# Patient Record
Sex: Female | Born: 1950
Health system: Southern US, Community
[De-identification: ages and names within clinical notes are randomized; demographics above are authoritative.]

## PROBLEM LIST (undated history)

## (undated) DIAGNOSIS — D509 Iron deficiency anemia, unspecified: Secondary | ICD-10-CM

## (undated) DIAGNOSIS — M1990 Unspecified osteoarthritis, unspecified site: Secondary | ICD-10-CM

## (undated) DIAGNOSIS — T7840XA Allergy, unspecified, initial encounter: Secondary | ICD-10-CM

## (undated) DIAGNOSIS — I10 Essential (primary) hypertension: Secondary | ICD-10-CM

## (undated) DIAGNOSIS — K219 Gastro-esophageal reflux disease without esophagitis: Secondary | ICD-10-CM

## (undated) DIAGNOSIS — Z87442 Personal history of urinary calculi: Secondary | ICD-10-CM

## (undated) DIAGNOSIS — R519 Headache, unspecified: Secondary | ICD-10-CM

## (undated) DIAGNOSIS — B019 Varicella without complication: Secondary | ICD-10-CM

## (undated) DIAGNOSIS — E785 Hyperlipidemia, unspecified: Secondary | ICD-10-CM

## (undated) DIAGNOSIS — N200 Calculus of kidney: Secondary | ICD-10-CM

## (undated) DIAGNOSIS — R51 Headache: Secondary | ICD-10-CM

## (undated) DIAGNOSIS — M810 Age-related osteoporosis without current pathological fracture: Secondary | ICD-10-CM

## (undated) HISTORY — DX: Allergy, unspecified, initial encounter: T78.40XA

## (undated) HISTORY — DX: Personal history of urinary calculi: Z87.442

## (undated) HISTORY — DX: Age-related osteoporosis without current pathological fracture: M81.0

## (undated) HISTORY — DX: Calculus of kidney: N20.0

## (undated) HISTORY — DX: Gastro-esophageal reflux disease without esophagitis: K21.9

## (undated) HISTORY — DX: Headache: R51

## (undated) HISTORY — PX: COLONOSCOPY: SHX174

## (undated) HISTORY — DX: Essential (primary) hypertension: I10

## (undated) HISTORY — DX: Headache, unspecified: R51.9

## (undated) HISTORY — DX: Unspecified osteoarthritis, unspecified site: M19.90

## (undated) HISTORY — DX: Hyperlipidemia, unspecified: E78.5

## (undated) HISTORY — PX: UPPER GASTROINTESTINAL ENDOSCOPY: SHX188

## (undated) HISTORY — PX: EYE SURGERY: SHX253

## (undated) HISTORY — DX: Varicella without complication: B01.9

## (undated) HISTORY — PX: TONSILLECTOMY: SUR1361

## (undated) HISTORY — DX: Iron deficiency anemia, unspecified: D50.9

---

## 1998-07-19 HISTORY — PX: CERVICAL FUSION: SHX112

## 1999-05-22 ENCOUNTER — Encounter: Admission: RE | Admit: 1999-05-22 | Discharge: 1999-05-22 | Payer: Self-pay | Admitting: Obstetrics and Gynecology

## 1999-05-22 ENCOUNTER — Encounter: Payer: Self-pay | Admitting: Obstetrics and Gynecology

## 1999-08-01 ENCOUNTER — Encounter: Payer: Self-pay | Admitting: Emergency Medicine

## 1999-08-01 ENCOUNTER — Inpatient Hospital Stay (HOSPITAL_COMMUNITY): Admission: EM | Admit: 1999-08-01 | Discharge: 1999-08-02 | Payer: Self-pay | Admitting: Emergency Medicine

## 1999-10-27 ENCOUNTER — Other Ambulatory Visit: Admission: RE | Admit: 1999-10-27 | Discharge: 1999-10-27 | Payer: Self-pay | Admitting: Obstetrics and Gynecology

## 2003-12-31 ENCOUNTER — Ambulatory Visit (HOSPITAL_COMMUNITY): Admission: RE | Admit: 2003-12-31 | Discharge: 2003-12-31 | Payer: Self-pay | Admitting: Gastroenterology

## 2005-06-06 ENCOUNTER — Emergency Department (HOSPITAL_COMMUNITY): Admission: EM | Admit: 2005-06-06 | Discharge: 2005-06-06 | Payer: Self-pay | Admitting: Emergency Medicine

## 2005-06-17 ENCOUNTER — Ambulatory Visit (HOSPITAL_COMMUNITY): Admission: RE | Admit: 2005-06-17 | Discharge: 2005-06-17 | Payer: Self-pay | Admitting: Urology

## 2005-07-01 ENCOUNTER — Ambulatory Visit (HOSPITAL_COMMUNITY): Admission: RE | Admit: 2005-07-01 | Discharge: 2005-07-01 | Payer: Self-pay | Admitting: Urology

## 2005-11-05 ENCOUNTER — Ambulatory Visit (HOSPITAL_COMMUNITY): Admission: RE | Admit: 2005-11-05 | Discharge: 2005-11-06 | Payer: Self-pay | Admitting: Urology

## 2006-06-03 ENCOUNTER — Emergency Department (HOSPITAL_COMMUNITY): Admission: EM | Admit: 2006-06-03 | Discharge: 2006-06-03 | Payer: Self-pay | Admitting: Emergency Medicine

## 2007-04-19 ENCOUNTER — Encounter: Admission: RE | Admit: 2007-04-19 | Discharge: 2007-04-19 | Payer: Self-pay | Admitting: Internal Medicine

## 2007-04-27 ENCOUNTER — Ambulatory Visit (HOSPITAL_COMMUNITY): Admission: RE | Admit: 2007-04-27 | Discharge: 2007-04-27 | Payer: Self-pay | Admitting: Urology

## 2009-05-15 ENCOUNTER — Emergency Department (HOSPITAL_COMMUNITY): Admission: EM | Admit: 2009-05-15 | Discharge: 2009-05-15 | Payer: Self-pay | Admitting: Emergency Medicine

## 2009-06-11 ENCOUNTER — Emergency Department (HOSPITAL_COMMUNITY): Admission: EM | Admit: 2009-06-11 | Discharge: 2009-06-11 | Payer: Self-pay | Admitting: Family Medicine

## 2009-07-17 ENCOUNTER — Emergency Department (HOSPITAL_COMMUNITY): Admission: EM | Admit: 2009-07-17 | Discharge: 2009-07-17 | Payer: Self-pay | Admitting: Family Medicine

## 2009-12-18 ENCOUNTER — Ambulatory Visit (HOSPITAL_COMMUNITY): Admission: RE | Admit: 2009-12-18 | Discharge: 2009-12-18 | Payer: Self-pay | Admitting: Neurological Surgery

## 2010-01-05 ENCOUNTER — Encounter: Admission: RE | Admit: 2010-01-05 | Discharge: 2010-01-05 | Payer: Self-pay | Admitting: Neurological Surgery

## 2010-01-13 ENCOUNTER — Encounter: Admission: RE | Admit: 2010-01-13 | Discharge: 2010-01-13 | Payer: Self-pay | Admitting: Anesthesiology

## 2010-03-27 ENCOUNTER — Encounter: Admission: RE | Admit: 2010-03-27 | Discharge: 2010-03-27 | Payer: Self-pay | Admitting: Internal Medicine

## 2010-04-09 ENCOUNTER — Encounter: Admission: RE | Admit: 2010-04-09 | Discharge: 2010-04-09 | Payer: Self-pay | Admitting: Internal Medicine

## 2010-10-05 LAB — BASIC METABOLIC PANEL
BUN: 18 mg/dL (ref 6–23)
CO2: 30 mEq/L (ref 19–32)
Calcium: 9.1 mg/dL (ref 8.4–10.5)
GFR calc Af Amer: 59 mL/min — ABNORMAL LOW (ref >60)
GFR calc non Af Amer: 49 mL/min — ABNORMAL LOW (ref >60)
Potassium: 3.4 mEq/L — ABNORMAL LOW (ref 3.5–5.1)

## 2010-10-05 LAB — CBC
Hemoglobin: 13.4 g/dL (ref 12.0–15.0)
MCHC: 33.3 g/dL (ref 30.0–36.0)
RDW: 14.8 % (ref 11.5–15.5)
WBC: 7.9 10*3/uL (ref 4.0–10.5)

## 2010-10-05 LAB — DIFFERENTIAL
Lymphs Abs: 2.3 10*3/uL (ref 0.7–4.0)
Neutro Abs: 4.7 10*3/uL (ref 1.7–7.7)
Neutrophils Relative %: 60 % (ref 43–77)

## 2010-10-05 LAB — SURGICAL PCR SCREEN: MRSA, PCR: NEGATIVE

## 2010-10-05 LAB — PROTIME-INR: INR: 0.9 (ref 0.00–1.49)

## 2010-10-19 LAB — POCT URINALYSIS DIP (DEVICE)
Hgb urine dipstick: NEGATIVE
Protein, ur: 30 mg/dL — AB
pH: 6 (ref 5.0–8.0)

## 2010-10-22 LAB — POCT I-STAT, CHEM 8
BUN: 21 mg/dL (ref 6–23)
Glucose, Bld: 99 mg/dL (ref 70–99)
HCT: 46 % (ref 36.0–46.0)
Hemoglobin: 15.6 g/dL — ABNORMAL HIGH (ref 12.0–15.0)
Potassium: 7.9 mEq/L (ref 3.5–5.1)
Sodium: 134 mEq/L — ABNORMAL LOW (ref 135–145)
TCO2: 29 mmol/L (ref 0–100)

## 2010-10-22 LAB — POCT CARDIAC MARKERS: Myoglobin, poc: 90.9 ng/mL (ref 12–200)

## 2015-11-10 ENCOUNTER — Ambulatory Visit (INDEPENDENT_AMBULATORY_CARE_PROVIDER_SITE_OTHER): Payer: Self-pay | Admitting: Internal Medicine

## 2015-11-10 ENCOUNTER — Other Ambulatory Visit: Payer: Self-pay | Admitting: Internal Medicine

## 2015-11-10 ENCOUNTER — Encounter: Payer: Self-pay | Admitting: Internal Medicine

## 2015-11-10 VITALS — BP 164/88 | HR 72 | Temp 97.8°F | Ht 62.0 in | Wt 112.0 lb

## 2015-11-10 DIAGNOSIS — I10 Essential (primary) hypertension: Secondary | ICD-10-CM | POA: Insufficient documentation

## 2015-11-10 DIAGNOSIS — R51 Headache: Secondary | ICD-10-CM

## 2015-11-10 DIAGNOSIS — K219 Gastro-esophageal reflux disease without esophagitis: Secondary | ICD-10-CM

## 2015-11-10 DIAGNOSIS — R519 Headache, unspecified: Secondary | ICD-10-CM

## 2015-11-10 DIAGNOSIS — J302 Other seasonal allergic rhinitis: Secondary | ICD-10-CM

## 2015-11-10 LAB — COMPREHENSIVE METABOLIC PANEL
ALBUMIN: 4.6 g/dL (ref 3.5–5.2)
ALK PHOS: 99 U/L (ref 39–117)
ALT: 16 U/L (ref 0–35)
AST: 25 U/L (ref 0–37)
BUN: 18 mg/dL (ref 6–23)
CALCIUM: 10.1 mg/dL (ref 8.4–10.5)
CHLORIDE: 105 meq/L (ref 96–112)
CO2: 28 mEq/L (ref 19–32)
CREATININE: 1.03 mg/dL (ref 0.40–1.20)
GFR: 57.41 mL/min — ABNORMAL LOW (ref >60.00)
Glucose, Bld: 101 mg/dL — ABNORMAL HIGH (ref 70–99)
POTASSIUM: 4.9 meq/L (ref 3.5–5.1)
SODIUM: 140 meq/L (ref 135–145)
TOTAL PROTEIN: 7.7 g/dL (ref 6.0–8.3)
Total Bilirubin: 0.3 mg/dL (ref 0.2–1.2)

## 2015-11-10 LAB — CBC
HEMATOCRIT: 43.4 % (ref 36.0–46.0)
Hemoglobin: 14.4 g/dL (ref 12.0–15.0)
MCHC: 33.2 g/dL (ref 30.0–36.0)
MCV: 96.3 fl (ref 78.0–100.0)
PLATELETS: 277 10*3/uL (ref 150.0–400.0)
RBC: 4.5 Mil/uL (ref 3.87–5.11)
RDW: 14.1 % (ref 11.5–15.5)
WBC: 6 10*3/uL (ref 4.0–10.5)

## 2015-11-10 LAB — LIPID PANEL
CHOLESTEROL: 262 mg/dL — AB (ref 0–200)
HDL: 59 mg/dL (ref >39.00)
LDL Cholesterol: 184 mg/dL — ABNORMAL HIGH (ref 0–99)
NonHDL: 202.74
TRIGLYCERIDES: 94 mg/dL (ref 0.0–149.0)
Total CHOL/HDL Ratio: 4
VLDL: 18.8 mg/dL (ref 0.0–40.0)

## 2015-11-10 MED ORDER — LISINOPRIL 10 MG PO TABS: 10.0000 mg | ORAL_TABLET | Freq: Every day | ORAL | Status: DC

## 2015-11-10 NOTE — Progress Notes (Signed)
Pre visit review using our clinic review tool, if applicable. No additional management support is needed unless otherwise documented below in the visit note. 

## 2015-11-10 NOTE — Assessment & Plan Note (Signed)
Continue Nexium daily If persist, consider checking for H Pylori

## 2015-11-10 NOTE — Progress Notes (Signed)
HPI  Pt presents to the clinic today to establish care and for management of the conditions listed below. She has not had a PCP in many years.  Frequent Headaches: Mixture of migraine and tension headaches. Occuring weekly. Start behind her left eye and radiates into her temples. She describes the pain as "vice like". She denies dizziness or blurred vision. She does have associated nausea, vomiting, sensitivity to light and sound. She takes Excedrin with minimal relief. She was on Imipramine, Fioricet and Imitrex in the past.  Seasonal Allergies: Worse in the Spring. She takes Nasacort as needed when her allergies flare up.  GERD: She is not sure what her triggers are. She has been taking Nexium OTC with good relief.  Elevated Blood Pressure: She reports she went to the eye doctor 2 weeks ago. Her BP at that time was 170/100. Her BP today is 164/88. She has never been told that she has had high blood pressure. She denies dizziness, blurred vision, chest pain or chest tightness. She has never been treated for this before.  Fly: 2015 Tetanus: 2011 Mammogram: 2011 Pap Smear: 2011 Colon Screening: 2004 Vision Screening: 10/2015 Dentist: as needed  Past Medical History  Diagnosis Date  . Chicken pox   . Frequent headaches   . Allergy   . History of kidney stones     Current Outpatient Prescriptions  Medication Sig Dispense Refill  . Aspirin-Acetaminophen-Caffeine (EXCEDRIN PO) Take 1-2 tablets by mouth as needed.     Marland Kitchen esomeprazole (NEXIUM) 20 MG capsule Take 20 mg by mouth daily at 12 noon.    . triamcinolone (NASACORT ALLERGY 24HR) 55 MCG/ACT AERO nasal inhaler Place 2 sprays into the nose daily as needed.     No current facility-administered medications for this visit.    No Known Allergies  Family History  Problem Relation Age of Onset  . Hyperlipidemia Mother   . Hypertension Mother   . Prostate cancer Father   . Hyperlipidemia Father   . Heart disease Father   .  Hypertension Sister   . Heart disease Brother   . Hypertension Maternal Grandmother     Social History   Social History  . Marital Status: Widowed    Spouse Name: N/A  . Number of Children: N/A  . Years of Education: N/A   Occupational History  . Not on file.   Social History Main Topics  . Smoking status: Never Smoker   . Smokeless tobacco: Never Used  . Alcohol Use: No  . Drug Use: No  . Sexual Activity: No   Other Topics Concern  . Not on file   Social History Narrative  . No narrative on file    ROS:  Constitutional: Pt reports headache. Denies fever, malaise, fatigue, or abrupt weight changes.  HEENT: Denies eye pain, eye redness, ear pain, ringing in the ears, wax buildup, runny nose, nasal congestion, bloody nose, or sore throat. Respiratory: Denies difficulty breathing, shortness of breath, cough or sputum production.   Cardiovascular: Denies chest pain, chest tightness, palpitations or swelling in the hands or feet.  Gastrointestinal: Denies abdominal pain, bloating, constipation, diarrhea or blood in the stool.  Neurological: Denies dizziness, difficulty with memory, difficulty with speech or problems with balance and coordination.  Psych: Denies anxiety, depression, SI/HI.  No other specific complaints in a complete review of systems (except as listed in HPI above).  PE:  BP 164/88 mmHg  Pulse 72  Temp(Src) 97.8 F (36.6 C) (Oral)  Ht _0  (  1.575 m)  Wt 112 lb (50.803 kg)  BMI 20.48 kg/m2 Wt Readings from Last 3 Encounters:  11/10/15 112 lb (50.803 kg)    General: Appears her stated age, well developed, well nourished in NAD. Skin: Dry and intact. HEENT: Head: normal shape and size; Eyes: sclera white, no icterus, conjunctiva pink, PERRLA and EOMs intact; Ears: Tm's gray and intact, normal light reflex;Throat/Mouth: Teeth present, mucosa pink and moist, no lesions or ulcerations noted.  Cardiovascular: Normal rate and rhythm. S1,S2 noted.  No  murmur, rubs or gallops noted. No JVD or BLE edema. No carotid bruits noted. Pulmonary/Chest: Normal effort and positive vesicular breath sounds. No respiratory distress. No wheezes, rales or ronchi noted.  Abdomen: Soft and nontender. Normal bowel sounds. Neurological: Alert and oriented.  Psychiatric: Mood and affect normal. Behavior is normal. Judgment and thought content normal.     BMET    Component Value Date/Time   NA 137 12/17/2009 0855   K 3.4* 12/17/2009 0855   CL 101 12/17/2009 0855   CO2 30 12/17/2009 0855   GLUCOSE 86 12/17/2009 0855   BUN 18 12/17/2009 0855   CREATININE 1.14 12/17/2009 0855   CALCIUM 9.1 12/17/2009 0855   GFRNONAA 49* 12/17/2009 0855   GFRAA * 12/17/2009 0855    59        The eGFR has been calculated using the MDRD equation. This calculation has not been validated in all clinical situations. eGFR's persistently <60 mL/min signify possible Chronic Kidney Disease.    Lipid Panel  No results found for: CHOL, TRIG, HDL, CHOLHDL, VLDL, LDLCALC  CBC    Component Value Date/Time   WBC 7.9 12/17/2009 0855   RBC 3.93 12/17/2009 0855   HGB 13.4 12/17/2009 0855   HCT 40.1 12/17/2009 0855   PLT 300 12/17/2009 0855   MCV 101.9* 12/17/2009 0855   MCHC 33.3 12/17/2009 0855   RDW 14.8 12/17/2009 0855   LYMPHSABS 2.3 12/17/2009 0855   MONOABS 0.7 12/17/2009 0855   EOSABS 0.2 12/17/2009 0855   BASOSABS 0.0 12/17/2009 0855    Hgb A1C No results found for: HGBA1C   Assessment and Plan:

## 2015-11-10 NOTE — Assessment & Plan Note (Signed)
Will check CBC, CMET and Lipid Profile today If labs normal, will start HCTZ  RTC in 3 weeks for BP check

## 2015-11-10 NOTE — Assessment & Plan Note (Signed)
Will see if headaches improve with treatment of BP If not, consider adding in amitriptyline versus propanolol  Continue Excedrin for now

## 2015-11-10 NOTE — Assessment & Plan Note (Signed)
Continue Nasocort as needed

## 2015-11-10 NOTE — Patient Instructions (Signed)
Hypertension Hypertension, commonly called high blood pressure, is when the force of blood pumping through your arteries is too strong. Your arteries are the blood vessels that carry blood from your heart throughout your body. A blood pressure reading consists of a higher number over a lower number, such as 110/72. The higher number (systolic) is the pressure inside your arteries when your heart pumps. The lower number (diastolic) is the pressure inside your arteries when your heart relaxes. Ideally you want your blood pressure below 120/80. Hypertension forces your heart to work harder to pump blood. Your arteries may become narrow or stiff. Having untreated or uncontrolled hypertension can cause heart attack, stroke, kidney disease, and other problems. RISK FACTORS Some risk factors for high blood pressure are controllable. Others are not.  Risk factors you cannot control include:   Race. You may be at higher risk if you are African American.  Age. Risk increases with age.  Gender. Men are at higher risk than women before age 45 years. After age 65, women are at higher risk than men. Risk factors you can control include:  Not getting enough exercise or physical activity.  Being overweight.  Getting too much fat, sugar, calories, or salt in your diet.  Drinking too much alcohol. SIGNS AND SYMPTOMS Hypertension does not usually cause signs or symptoms. Extremely high blood pressure (hypertensive crisis) may cause headache, anxiety, shortness of breath, and nosebleed. DIAGNOSIS To check if you have hypertension, your health care provider will measure your blood pressure while you are seated, with your arm held at the level of your heart. It should be measured at least twice using the same arm. Certain conditions can cause a difference in blood pressure between your right and left arms. A blood pressure reading that is higher than normal on one occasion does not mean that you need treatment. If  it is not clear whether you have high blood pressure, you may be asked to return on a different day to have your blood pressure checked again. Or, you may be asked to monitor your blood pressure at home for 1 or more weeks. TREATMENT Treating high blood pressure includes making lifestyle changes and possibly taking medicine. Living a healthy lifestyle can help lower high blood pressure. You may need to change some of your habits. Lifestyle changes may include:  Following the DASH diet. This diet is high in fruits, vegetables, and whole grains. It is low in salt, red meat, and added sugars.  Keep your sodium intake below 2,300 mg per day.  Getting at least 30-45 minutes of aerobic exercise at least 4 times per week.  Losing weight if necessary.  Not smoking.  Limiting alcoholic beverages.  Learning ways to reduce stress. Your health care provider may prescribe medicine if lifestyle changes are not enough to get your blood pressure under control, and if one of the following is true:  You are 18-59 years of age and your systolic blood pressure is above 140.  You are 60 years of age or older, and your systolic blood pressure is above 150.  Your diastolic blood pressure is above 90.  You have diabetes, and your systolic blood pressure is over 140 or your diastolic blood pressure is over 90.  You have kidney disease and your blood pressure is above 140/90.  You have heart disease and your blood pressure is above 140/90. Your personal target blood pressure may vary depending on your medical conditions, your age, and other factors. HOME CARE INSTRUCTIONS    Have your blood pressure rechecked as directed by your health care provider.   Take medicines only as directed by your health care provider. Follow the directions carefully. Blood pressure medicines must be taken as prescribed. The medicine does not work as well when you skip doses. Skipping doses also puts you at risk for  problems.  Do not smoke.   Monitor your blood pressure at home as directed by your health care provider. SEEK MEDICAL CARE IF:   You think you are having a reaction to medicines taken.  You have recurrent headaches or feel dizzy.  You have swelling in your ankles.  You have trouble with your vision. SEEK IMMEDIATE MEDICAL CARE IF:  You develop a severe headache or confusion.  You have unusual weakness, numbness, or feel faint.  You have severe chest or abdominal pain.  You vomit repeatedly.  You have trouble breathing. MAKE SURE YOU:   Understand these instructions.  Will watch your condition.  Will get help right away if you are not doing well or get worse.   This information is not intended to replace advice given to you by your health care provider. Make sure you discuss any questions you have with your health care provider.   Document Released: 07/05/2005 Document Revised: 11/19/2014 Document Reviewed: 04/27/2013 Elsevier Interactive Patient Education 2016 [employer]  

## 2015-11-12 ENCOUNTER — Other Ambulatory Visit: Payer: Self-pay | Admitting: Internal Medicine

## 2015-11-12 MED ORDER — SIMVASTATIN 10 MG PO TABS: 10.0000 mg | ORAL_TABLET | Freq: Every day | ORAL | Status: DC

## 2015-11-17 ENCOUNTER — Telehealth: Payer: Self-pay

## 2015-11-17 NOTE — Telephone Encounter (Signed)
Have her continue to take the Lisinopril, as this may subside in the next few weeks. We can talk about changing it at her follow up appt in 2 weeks if issue still persist. The cough could likely be allergies- I would encourage her to take Zyrtec daily.

## 2015-11-17 NOTE — Telephone Encounter (Signed)
Left detailed msg on VM per HIPAA  

## 2015-11-17 NOTE — Telephone Encounter (Signed)
Pt left v/m; pt coughing to the point throat is sore from coughing. Pt thinks lisinopril is causing the cough; pt request different med to [facility]. Pt request cb.

## 2015-12-01 ENCOUNTER — Ambulatory Visit (INDEPENDENT_AMBULATORY_CARE_PROVIDER_SITE_OTHER): Payer: Self-pay | Admitting: Internal Medicine

## 2015-12-01 ENCOUNTER — Encounter: Payer: Self-pay | Admitting: Internal Medicine

## 2015-12-01 VITALS — BP 144/88 | HR 90 | Temp 98.5°F | Wt 112.5 lb

## 2015-12-01 DIAGNOSIS — I10 Essential (primary) hypertension: Secondary | ICD-10-CM

## 2015-12-01 DIAGNOSIS — E785 Hyperlipidemia, unspecified: Secondary | ICD-10-CM

## 2015-12-01 MED ORDER — LISINOPRIL 20 MG PO TABS: 20.0000 mg | ORAL_TABLET | Freq: Every day | ORAL | Status: DC

## 2015-12-01 NOTE — Assessment & Plan Note (Addendum)
Improved but not at goal Will increase Lisinopril to 20 mg daily ECG today normal

## 2015-12-01 NOTE — Progress Notes (Signed)
Subjective:    Patient ID: Teresa Lozano, female    DOB: 05/22/1951, 65 y.o.   MRN: [id number]  HPI  Pt presents to the clinic today for 3 week follow up of HTN. She was started on Lisinopril at her last visit for a BP of 164/88. She has been taking the medication as prescribed. She reports she has felt more tired but denies adverse side effects. Her BP today is 144/88. There is no ECG on file for review.  She also reports that she is having muscle and joint pains. She has been taking Zocor since her last visit. She thinks that is the cause. She had similar symptoms when she was taking Crestor.  Review of Systems      Past Medical History  Diagnosis Date  . Chicken pox   . Frequent headaches   . Allergy   . History of kidney stones     Current Outpatient Prescriptions  Medication Sig Dispense Refill  . Aspirin-Acetaminophen-Caffeine (EXCEDRIN PO) Take 1-2 tablets by mouth as needed.     Marland Kitchen esomeprazole (NEXIUM) 20 MG capsule Take 20 mg by mouth daily at 12 noon.    Marland Kitchen lisinopril (PRINIVIL,ZESTRIL) 10 MG tablet Take 1 tablet (10 mg total) by mouth daily. 30 tablet 1  . simvastatin (ZOCOR) 10 MG tablet Take 1 tablet (10 mg total) by mouth daily. 30 tablet 2  . triamcinolone (NASACORT ALLERGY 24HR) 55 MCG/ACT AERO nasal inhaler Place 2 sprays into the nose daily as needed.     No current facility-administered medications for this visit.    No Known Allergies  Family History  Problem Relation Age of Onset  . Hyperlipidemia Mother   . Hypertension Mother   . Prostate cancer Father   . Hyperlipidemia Father   . Heart disease Father   . Hypertension Sister   . Heart disease Brother   . Hypertension Maternal Grandmother     Social History   Social History  . Marital Status: Widowed    Spouse Name: N/A  . Number of Children: N/A  . Years of Education: N/A   Occupational History  . Not on file.   Social History Main Topics  . Smoking status: Never Smoker   .  Smokeless tobacco: Never Used  . Alcohol Use: No  . Drug Use: No  . Sexual Activity: No   Other Topics Concern  . Not on file   Social History Narrative  . No narrative on file     Constitutional: Pt reports fatigue. Denies fever, malaise, headache or abrupt weight changes.  Respiratory: Denies difficulty breathing, shortness of breath, cough or sputum production.   Cardiovascular: Denies chest pain, chest tightness, palpitations or swelling in the hands or feet.   MSK: Pt reports muscle and joint pain. Denies decrease in range of motion or difficulty with gait. Neurological: Denies dizziness, difficulty with memory, difficulty with speech or problems with balance and coordination.    No other specific complaints in a complete review of systems (except as listed in HPI above).  Objective:   Physical Exam  BP 144/88 mmHg  Pulse 90  Temp(Src) 98.5 F (36.9 C) (Oral)  Wt 112 lb 8 oz (51.03 kg)  SpO2 98% Wt Readings from Last 3 Encounters:  12/01/15 112 lb 8 oz (51.03 kg)  11/10/15 112 lb (50.803 kg)    General: Appears her stated age, well developed, well nourished in NAD. HEENT: Head: normal shape and size; Eyes: sclera white, no icterus, conjunctiva  pink, PERRLA and EOMs intact;  Cardiovascular: Normal rate and rhythm. S1,S2 noted.  No murmur, rubs or gallops noted.  No carotid bruits noted. Pulmonary/Chest: Normal effort and positive vesicular breath sounds. No respiratory distress. No wheezes, rales or ronchi noted.  MSK: No signs of joint swelling, no difficulty with gait. Neurological: Alert and oriented.    BMET    Component Value Date/Time   NA 140 11/10/2015 1130   K 4.9 11/10/2015 1130   CL 105 11/10/2015 1130   CO2 28 11/10/2015 1130   GLUCOSE 101* 11/10/2015 1130   BUN 18 11/10/2015 1130   CREATININE 1.03 11/10/2015 1130   CALCIUM 10.1 11/10/2015 1130   GFRNONAA 49* 12/17/2009 0855   GFRAA * 12/17/2009 0855    59        The eGFR has been  calculated using the MDRD equation. This calculation has not been validated in all clinical situations. eGFR's persistently <60 mL/min signify possible Chronic Kidney Disease.    Lipid Panel     Component Value Date/Time   CHOL 262* 11/10/2015 1130   TRIG 94.0 11/10/2015 1130   HDL 59.00 11/10/2015 1130   CHOLHDL 4 11/10/2015 1130   VLDL 18.8 11/10/2015 1130   LDLCALC 184* 11/10/2015 1130    CBC    Component Value Date/Time   WBC 6.0 11/10/2015 1130   RBC 4.50 11/10/2015 1130   HGB 14.4 11/10/2015 1130   HCT 43.4 11/10/2015 1130   PLT 277.0 11/10/2015 1130   MCV 96.3 11/10/2015 1130   MCHC 33.2 11/10/2015 1130   RDW 14.1 11/10/2015 1130   LYMPHSABS 2.3 12/17/2009 0855   MONOABS 0.7 12/17/2009 0855   EOSABS 0.2 12/17/2009 0855   BASOSABS 0.0 12/17/2009 0855    Hgb A1C No results found for: HGBA1C       Assessment & Plan:

## 2015-12-01 NOTE — Progress Notes (Signed)
Pre visit review using our clinic review tool, if applicable. No additional management support is needed unless otherwise documented below in the visit note. 

## 2015-12-01 NOTE — Patient Instructions (Signed)
Hypertension Hypertension, commonly called high blood pressure, is when the force of blood pumping through your arteries is too strong. Your arteries are the blood vessels that carry blood from your heart throughout your body. A blood pressure reading consists of a higher number over a lower number, such as 110/72. The higher number (systolic) is the pressure inside your arteries when your heart pumps. The lower number (diastolic) is the pressure inside your arteries when your heart relaxes. Ideally you want your blood pressure below 120/80. Hypertension forces your heart to work harder to pump blood. Your arteries may become narrow or stiff. Having untreated or uncontrolled hypertension can cause heart attack, stroke, kidney disease, and other problems. RISK FACTORS Some risk factors for high blood pressure are controllable. Others are not.  Risk factors you cannot control include:   Race. You may be at higher risk if you are African American.  Age. Risk increases with age.  Gender. Men are at higher risk than women before age 45 years. After age 65, women are at higher risk than men. Risk factors you can control include:  Not getting enough exercise or physical activity.  Being overweight.  Getting too much fat, sugar, calories, or salt in your diet.  Drinking too much alcohol. SIGNS AND SYMPTOMS Hypertension does not usually cause signs or symptoms. Extremely high blood pressure (hypertensive crisis) may cause headache, anxiety, shortness of breath, and nosebleed. DIAGNOSIS To check if you have hypertension, your health care provider will measure your blood pressure while you are seated, with your arm held at the level of your heart. It should be measured at least twice using the same arm. Certain conditions can cause a difference in blood pressure between your right and left arms. A blood pressure reading that is higher than normal on one occasion does not mean that you need treatment. If  it is not clear whether you have high blood pressure, you may be asked to return on a different day to have your blood pressure checked again. Or, you may be asked to monitor your blood pressure at home for 1 or more weeks. TREATMENT Treating high blood pressure includes making lifestyle changes and possibly taking medicine. Living a healthy lifestyle can help lower high blood pressure. You may need to change some of your habits. Lifestyle changes may include:  Following the DASH diet. This diet is high in fruits, vegetables, and whole grains. It is low in salt, red meat, and added sugars.  Keep your sodium intake below 2,300 mg per day.  Getting at least 30-45 minutes of aerobic exercise at least 4 times per week.  Losing weight if necessary.  Not smoking.  Limiting alcoholic beverages.  Learning ways to reduce stress. Your health care provider may prescribe medicine if lifestyle changes are not enough to get your blood pressure under control, and if one of the following is true:  You are 18-59 years of age and your systolic blood pressure is above 140.  You are 60 years of age or older, and your systolic blood pressure is above 150.  Your diastolic blood pressure is above 90.  You have diabetes, and your systolic blood pressure is over 140 or your diastolic blood pressure is over 90.  You have kidney disease and your blood pressure is above 140/90.  You have heart disease and your blood pressure is above 140/90. Your personal target blood pressure may vary depending on your medical conditions, your age, and other factors. HOME CARE INSTRUCTIONS    Have your blood pressure rechecked as directed by your health care provider.   Take medicines only as directed by your health care provider. Follow the directions carefully. Blood pressure medicines must be taken as prescribed. The medicine does not work as well when you skip doses. Skipping doses also puts you at risk for  problems.  Do not smoke.   Monitor your blood pressure at home as directed by your health care provider. SEEK MEDICAL CARE IF:   You think you are having a reaction to medicines taken.  You have recurrent headaches or feel dizzy.  You have swelling in your ankles.  You have trouble with your vision. SEEK IMMEDIATE MEDICAL CARE IF:  You develop a severe headache or confusion.  You have unusual weakness, numbness, or feel faint.  You have severe chest or abdominal pain.  You vomit repeatedly.  You have trouble breathing. MAKE SURE YOU:   Understand these instructions.  Will watch your condition.  Will get help right away if you are not doing well or get worse.   This information is not intended to replace advice given to you by your health care provider. Make sure you discuss any questions you have with your health care provider.   Document Released: 07/05/2005 Document Revised: 11/19/2014 Document Reviewed: 04/27/2013 Elsevier Interactive Patient Education 2016 [employer]  

## 2015-12-01 NOTE — Assessment & Plan Note (Signed)
Zocor causing myalgias Advised her to stop it x 1 week, then restart at 1/2 tab daily x 1 week, increasing to a whole tab daily therafter Encouraged her to consume a low fat diet  RTC in 3 months to follow up HTN/HLD

## 2016-02-15 ENCOUNTER — Other Ambulatory Visit: Payer: Self-pay | Admitting: Internal Medicine

## 2016-02-16 ENCOUNTER — Other Ambulatory Visit: Payer: Self-pay | Admitting: Internal Medicine

## 2016-03-02 ENCOUNTER — Encounter: Payer: Self-pay | Admitting: Internal Medicine

## 2016-03-02 ENCOUNTER — Ambulatory Visit (INDEPENDENT_AMBULATORY_CARE_PROVIDER_SITE_OTHER): Payer: Self-pay | Admitting: Internal Medicine

## 2016-03-02 VITALS — BP 134/80 | HR 98 | Temp 98.0°F | Wt 114.0 lb

## 2016-03-02 DIAGNOSIS — M2550 Pain in unspecified joint: Secondary | ICD-10-CM

## 2016-03-02 DIAGNOSIS — I10 Essential (primary) hypertension: Secondary | ICD-10-CM

## 2016-03-02 DIAGNOSIS — E785 Hyperlipidemia, unspecified: Secondary | ICD-10-CM

## 2016-03-02 LAB — COMPREHENSIVE METABOLIC PANEL
ALBUMIN: 4.3 g/dL (ref 3.5–5.2)
ALT: 15 U/L (ref 0–35)
AST: 20 U/L (ref 0–37)
Alkaline Phosphatase: 75 U/L (ref 39–117)
BILIRUBIN TOTAL: 0.2 mg/dL (ref 0.2–1.2)
BUN: 24 mg/dL — AB (ref 6–23)
CALCIUM: 9.7 mg/dL (ref 8.4–10.5)
CO2: 24 mEq/L (ref 19–32)
CREATININE: 1.18 mg/dL (ref 0.40–1.20)
Chloride: 110 mEq/L (ref 96–112)
GFR: 49.03 mL/min — ABNORMAL LOW (ref >60.00)
Glucose, Bld: 102 mg/dL — ABNORMAL HIGH (ref 70–99)
Potassium: 4.6 mEq/L (ref 3.5–5.1)
SODIUM: 141 meq/L (ref 135–145)
Total Protein: 7.1 g/dL (ref 6.0–8.3)

## 2016-03-02 LAB — LIPID PANEL
CHOL/HDL RATIO: 4
CHOLESTEROL: 176 mg/dL (ref 0–200)
HDL: 49.5 mg/dL (ref >39.00)
LDL Cholesterol: 101 mg/dL — ABNORMAL HIGH (ref 0–99)
NonHDL: 126.99
TRIGLYCERIDES: 128 mg/dL (ref 0.0–149.0)
VLDL: 25.6 mg/dL (ref 0.0–40.0)

## 2016-03-02 LAB — RHEUMATOID FACTOR: RHEUMATOID FACTOR: 13 [IU]/mL (ref <=14)

## 2016-03-02 LAB — SEDIMENTATION RATE: SED RATE: 10 mm/h (ref 0–30)

## 2016-03-02 NOTE — Patient Instructions (Signed)

## 2016-03-02 NOTE — Assessment & Plan Note (Signed)
Handout given on low fat diet CMET and Lipid Profile today Continue Zocor unless directed otherwise

## 2016-03-02 NOTE — Assessment & Plan Note (Signed)
Controlled on Lisinopril 20 mg daily CMET today

## 2016-03-02 NOTE — Progress Notes (Signed)
Subjective:    Patient ID: Teresa Lozano, female    DOB: Jan 06, 1951, 65 y.o.   MRN: [id number]  HPI  Pt presents to the clinic today for 3 month follow up of HTN.  Her Lisinopril was increased to '20mg'$  at her last visit on 12/01/15.  She reports she is taking it daily.  Her BP today is 134/80. She denies chest pain, palpitations, shortness of breath, vision changes, or dizziness.  ECG from 12/01/15 reviewed.    She also presents for 3 month follow-up of HLD.  As instructed at the last visit on 12/01/15, she stopped Zocor for 1 week, restarted 1/2 tab for 1 week, and then increased to 1 tab thereafter.  She is currently taking 1 tab Zocor daily.  She reports she continues to have muscle and joint pains in her hands and feet.  She states she had the symptoms prior to starting treatment with Zocor.  She reports a family history of arthritis, and denies family history of RA.  She reports she tries to walk a few miles every day, and her right foot has been swelling after walking for the past 2-3 weeks.  She does not follow a low fat diet.  Review of Systems      Past Medical History:  Diagnosis Date  . Allergy   . Chicken pox   . Frequent headaches   . History of kidney stones     Current Outpatient Prescriptions  Medication Sig Dispense Refill  . Aspirin-Acetaminophen-Caffeine (EXCEDRIN PO) Take 1-2 tablets by mouth as needed.     . cetirizine (ZYRTEC) 10 MG tablet Take 10 mg by mouth daily.    Marland Kitchen esomeprazole (NEXIUM) 20 MG capsule Take 20 mg by mouth daily at 12 noon.    Marland Kitchen lisinopril (PRINIVIL,ZESTRIL) 20 MG tablet Take 1 tablet (20 mg total) by mouth daily. 30 tablet 2  . simvastatin (ZOCOR) 10 MG tablet Take 1 tablet (10 mg total) by mouth daily. MUST SCHEDULE FOLLOW UP OFFICE VISIT 30 tablet 0   No current facility-administered medications for this visit.     No Known Allergies  Family History  Problem Relation Age of Onset  . Hyperlipidemia Mother   . Hypertension Mother   .  Prostate cancer Father   . Hyperlipidemia Father   . Heart disease Father   . Hypertension Sister   . Heart disease Brother   . Hypertension Maternal Grandmother     Social History   Social History  . Marital status: Widowed    Spouse name: N/A  . Number of children: N/A  . Years of education: N/A   Occupational History  . Not on file.   Social History Main Topics  . Smoking status: Never Smoker  . Smokeless tobacco: Never Used  . Alcohol use No  . Drug use: No  . Sexual activity: No   Other Topics Concern  . Not on file   Social History Narrative  . No narrative on file     Constitutional: Denies dizziness. Respiratory: Denies shortness of breath. Cardiovascular: Pt reports swelling of right foot.  Denies chest pain, chest tightness, palpitations or swelling in the hands. MSK: Pt reports muscle and joint pain. Denies decrease in range of motion or difficulty with gait.   No other specific complaints in a complete review of systems (except as listed in HPI above).  Objective:   Physical Exam  BP 134/80   Pulse 98   Temp 98 F (36.7 C) (Oral)  Wt 114 lb (51.7 kg)   SpO2 98%   BMI 20.85 kg/m  Wt Readings from Last 3 Encounters:  03/02/16 114 lb (51.7 kg)  12/01/15 112 lb 8 oz (51 kg)  11/10/15 112 lb (50.8 kg)    General: Appears her stated age, well developed, well nourished in NAD. HEENT: Head: normal shape and size; Eyes: sclera white, no icterus, conjunctiva pink Cardiovascular: Normal rate and rhythm. S1,S2 noted.  No murmur, rubs or gallops noted.  No carotid bruits noted.  No peripheral edema noted. Pulmonary/Chest: Normal effort and positive vesicular breath sounds. No respiratory distress. No wheezes, rales or ronchi noted.  MSK: No signs of joint swelling.  Full AROM bilateral wrists.  Grip strength 5/5.  Strength 5/5 at bilateral elbows.  Right foot nontender to palpation.  Full AROM and strength bilateral ankles.  Strength 5/5  BLE. Neurological: Alert and oriented.    BMET    Component Value Date/Time   NA 140 11/10/2015 1130   K 4.9 11/10/2015 1130   CL 105 11/10/2015 1130   CO2 28 11/10/2015 1130   GLUCOSE 101 (H) 11/10/2015 1130   BUN 18 11/10/2015 1130   CREATININE 1.03 11/10/2015 1130   CALCIUM 10.1 11/10/2015 1130   GFRNONAA 49 (L) 12/17/2009 0855   GFRAA (L) 12/17/2009 0855    59        The eGFR has been calculated using the MDRD equation. This calculation has not been validated in all clinical situations. eGFR's persistently <60 mL/min signify possible Chronic Kidney Disease.    Lipid Panel     Component Value Date/Time   CHOL 262 (H) 11/10/2015 1130   TRIG 94.0 11/10/2015 1130   HDL 59.00 11/10/2015 1130   CHOLHDL 4 11/10/2015 1130   VLDL 18.8 11/10/2015 1130   LDLCALC 184 (H) 11/10/2015 1130    CBC    Component Value Date/Time   WBC 6.0 11/10/2015 1130   RBC 4.50 11/10/2015 1130   HGB 14.4 11/10/2015 1130   HCT 43.4 11/10/2015 1130   PLT 277.0 11/10/2015 1130   MCV 96.3 11/10/2015 1130   MCHC 33.2 11/10/2015 1130   RDW 14.1 11/10/2015 1130   LYMPHSABS 2.3 12/17/2009 0855   MONOABS 0.7 12/17/2009 0855   EOSABS 0.2 12/17/2009 0855   BASOSABS 0.0 12/17/2009 0855    Hgb A1C No results found for: HGBA1C       Assessment & Plan:

## 2016-03-02 NOTE — Progress Notes (Signed)
Subjective:    Patient ID: Teresa Lozano, female    DOB: 1951-02-02, 65 y.o.   MRN: [id number]  HPI  Pt presents to the clinic today for 3 month follow up of HTN and HLD.  HTN: We increased her Lisinopril to 20 mg at her last visit. She has been taking the medication as directed and denies adverse effects. Her BP today is 134/80. ECG from 11/2015 reviewed.  HLD: Her last LDL was 184. She was started on Zocor but started having myalgias. We had her hold the Zocor for 1 week, then restart at 1/2 tablet and increase to 1 tab after a week. She reports she continues to have the muscle aches in her legs and joint pains in her hands and feet (although she had reports she had this prior to taking any cholesterol medication). She has not been trying to consume a low fat diet.   Review of Systems      Past Medical History:  Diagnosis Date  . Allergy   . Chicken pox   . Frequent headaches   . History of kidney stones     Current Outpatient Prescriptions  Medication Sig Dispense Refill  . Aspirin-Acetaminophen-Caffeine (EXCEDRIN PO) Take 1-2 tablets by mouth as needed.     . cetirizine (ZYRTEC) 10 MG tablet Take 10 mg by mouth daily.    Marland Kitchen esomeprazole (NEXIUM) 20 MG capsule Take 20 mg by mouth daily at 12 noon.    Marland Kitchen lisinopril (PRINIVIL,ZESTRIL) 20 MG tablet Take 1 tablet (20 mg total) by mouth daily. 30 tablet 2  . simvastatin (ZOCOR) 10 MG tablet Take 1 tablet (10 mg total) by mouth daily. MUST SCHEDULE FOLLOW UP OFFICE VISIT 30 tablet 0   No current facility-administered medications for this visit.     No Known Allergies  Family History  Problem Relation Age of Onset  . Hyperlipidemia Mother   . Hypertension Mother   . Prostate cancer Father   . Hyperlipidemia Father   . Heart disease Father   . Hypertension Sister   . Heart disease Brother   . Hypertension Maternal Grandmother     Social History   Social History  . Marital status: Widowed    Spouse name: N/A  .  Number of children: N/A  . Years of education: N/A   Occupational History  . Not on file.   Social History Main Topics  . Smoking status: Never Smoker  . Smokeless tobacco: Never Used  . Alcohol use No  . Drug use: No  . Sexual activity: No   Other Topics Concern  . Not on file   Social History Narrative  . No narrative on file     Constitutional: Denies fever, malaise, fatigue, headache or abrupt weight changes.  Respiratory: Denies difficulty breathing, shortness of breath, cough or sputum production.   Cardiovascular: Denies chest pain, chest tightness, palpitations or swelling in the hands or feet.  Musculoskeletal: Pt presents muscles and joint pains. Denies decrease in range of motion, difficulty with gait, muscle pain or joint pain and swelling.  Neurological: Denies dizziness, difficulty with memory, difficulty with speech or problems with balance and coordination.   No other specific complaints in a complete review of systems (except as listed in HPI above).  Objective:   Physical Exam  BP 134/80   Pulse 98   Temp 98 F (36.7 C) (Oral)   Wt 114 lb (51.7 kg)   SpO2 98%   BMI 20.85 kg/m  Wt  Readings from Last 3 Encounters:  03/02/16 114 lb (51.7 kg)  12/01/15 112 lb 8 oz (51 kg)  11/10/15 112 lb (50.8 kg)    General: Appears her stated age, well developed, well nourished in NAD. Cardiovascular: Normal rate and rhythm. S1,S2 noted.  No murmur, rubs or gallops noted.  Pulmonary/Chest: Normal effort and positive vesicular breath sounds. No respiratory distress. No wheezes, rales or ronchi noted.  Musculoskeletal: Normal range of motion. No signs of joint swelling. Hand grips equal, strength 5/5 BLE. No difficulty with gait.  Neurological: Alert and oriented.    BMET    Component Value Date/Time   NA 140 11/10/2015 1130   K 4.9 11/10/2015 1130   CL 105 11/10/2015 1130   CO2 28 11/10/2015 1130   GLUCOSE 101 (H) 11/10/2015 1130   BUN 18 11/10/2015 1130    CREATININE 1.03 11/10/2015 1130   CALCIUM 10.1 11/10/2015 1130   GFRNONAA 49 (L) 12/17/2009 0855   GFRAA (L) 12/17/2009 0855    59        The eGFR has been calculated using the MDRD equation. This calculation has not been validated in all clinical situations. eGFR's persistently <60 mL/min signify possible Chronic Kidney Disease.    Lipid Panel     Component Value Date/Time   CHOL 262 (H) 11/10/2015 1130   TRIG 94.0 11/10/2015 1130   HDL 59.00 11/10/2015 1130   CHOLHDL 4 11/10/2015 1130   VLDL 18.8 11/10/2015 1130   LDLCALC 184 (H) 11/10/2015 1130    CBC    Component Value Date/Time   WBC 6.0 11/10/2015 1130   RBC 4.50 11/10/2015 1130   HGB 14.4 11/10/2015 1130   HCT 43.4 11/10/2015 1130   PLT 277.0 11/10/2015 1130   MCV 96.3 11/10/2015 1130   MCHC 33.2 11/10/2015 1130   RDW 14.1 11/10/2015 1130   LYMPHSABS 2.3 12/17/2009 0855   MONOABS 0.7 12/17/2009 0855   EOSABS 0.2 12/17/2009 0855   BASOSABS 0.0 12/17/2009 0855    Hgb A1C No results found for: HGBA1C          Assessment & Plan:   Multiple joint pains:  Likely osteoarthritis Will check ESR, RF and ANA Try Tylenol Arthritis Make an appt for your annual exam in 6 months BAITY, REGINA, NP

## 2016-03-03 LAB — ANA: ANA: NEGATIVE

## 2016-03-03 MED ORDER — SIMVASTATIN 10 MG PO TABS: 10.0000 mg | ORAL_TABLET | Freq: Every day | ORAL | 5 refills | Status: DC

## 2016-03-03 NOTE — Addendum Note (Signed)
Addended by: Lurlean Nanny on: 03/03/2016 06:50 PM   Modules accepted: Orders

## 2016-03-06 ENCOUNTER — Other Ambulatory Visit: Payer: Self-pay | Admitting: Internal Medicine

## 2016-03-06 DIAGNOSIS — I10 Essential (primary) hypertension: Secondary | ICD-10-CM

## 2016-04-26 ENCOUNTER — Telehealth: Payer: Self-pay

## 2016-04-26 NOTE — Telephone Encounter (Signed)
Pt left v/m; pt has been coughing since started lisinopril; now coughing all night and at times pt coughs so much she vomits. Pt request to stop lisinopril and start different med for BP. [facility]. Pt request cb. Last seen 03/02/16.

## 2016-04-26 NOTE — Telephone Encounter (Signed)
Can change to Amlodipine 10 mg daily, RTC in 2 weeks for BP check

## 2016-04-27 NOTE — Addendum Note (Signed)
Addended by: Lurlean Nanny on: 04/27/2016 11:27 AM   Modules accepted: Orders

## 2016-04-27 NOTE — Telephone Encounter (Signed)
Pt is aware--Rx sent to pharmacy and 2 week f/u scheduled.Marland KitchenMarland KitchenLisinopril added to to allergy list

## 2016-04-29 MED ORDER — AMLODIPINE BESYLATE 10 MG PO TABS: 10.0000 mg | ORAL_TABLET | Freq: Every day | ORAL | 0 refills | Status: DC

## 2016-04-29 NOTE — Addendum Note (Signed)
Addended by: Helene Shoe on: 04/29/2016 01:58 PM   Modules accepted: Orders

## 2016-04-29 NOTE — Telephone Encounter (Addendum)
Pt said amlodipine was not at [facility]; I spoke with Nicki Reaper at [facility] and he does not have amlodipine. Medication phoned to [facility] at [place] as instructed #30 x 0 refill since pt to be seen in 2 weeks. Cancelled lisinopril refills. Pt will ck with pharmacy later today to pick up rx.

## 2016-05-13 ENCOUNTER — Encounter: Payer: Self-pay | Admitting: Internal Medicine

## 2016-05-13 ENCOUNTER — Ambulatory Visit (INDEPENDENT_AMBULATORY_CARE_PROVIDER_SITE_OTHER): Payer: Self-pay | Admitting: Internal Medicine

## 2016-05-13 DIAGNOSIS — I10 Essential (primary) hypertension: Secondary | ICD-10-CM

## 2016-05-13 MED ORDER — AMLODIPINE BESYLATE 10 MG PO TABS: 10.0000 mg | ORAL_TABLET | Freq: Every day | ORAL | 11 refills | Status: DC

## 2016-05-13 NOTE — Assessment & Plan Note (Signed)
Controlled on Amlodipine 10 mg daily  Medication refilled today Will monitor

## 2016-05-13 NOTE — Patient Instructions (Signed)
Hypertension Hypertension, commonly called high blood pressure, is when the force of blood pumping through your arteries is too strong. Your arteries are the blood vessels that carry blood from your heart throughout your body. A blood pressure reading consists of a higher number over a lower number, such as 110/72. The higher number (systolic) is the pressure inside your arteries when your heart pumps. The lower number (diastolic) is the pressure inside your arteries when your heart relaxes. Ideally you want your blood pressure below 120/80. Hypertension forces your heart to work harder to pump blood. Your arteries may become narrow or stiff. Having untreated or uncontrolled hypertension can cause heart attack, stroke, kidney disease, and other problems. RISK FACTORS Some risk factors for high blood pressure are controllable. Others are not.  Risk factors you cannot control include:   Race. You may be at higher risk if you are African American.  Age. Risk increases with age.  Gender. Men are at higher risk than women before age 45 years. After age 65, women are at higher risk than men. Risk factors you can control include:  Not getting enough exercise or physical activity.  Being overweight.  Getting too much fat, sugar, calories, or salt in your diet.  Drinking too much alcohol. SIGNS AND SYMPTOMS Hypertension does not usually cause signs or symptoms. Extremely high blood pressure (hypertensive crisis) may cause headache, anxiety, shortness of breath, and nosebleed. DIAGNOSIS To check if you have hypertension, your health care provider will measure your blood pressure while you are seated, with your arm held at the level of your heart. It should be measured at least twice using the same arm. Certain conditions can cause a difference in blood pressure between your right and left arms. A blood pressure reading that is higher than normal on one occasion does not mean that you need treatment. If  it is not clear whether you have high blood pressure, you may be asked to return on a different day to have your blood pressure checked again. Or, you may be asked to monitor your blood pressure at home for 1 or more weeks. TREATMENT Treating high blood pressure includes making lifestyle changes and possibly taking medicine. Living a healthy lifestyle can help lower high blood pressure. You may need to change some of your habits. Lifestyle changes may include:  Following the DASH diet. This diet is high in fruits, vegetables, and whole grains. It is low in salt, red meat, and added sugars.  Keep your sodium intake below 2,300 mg per day.  Getting at least 30-45 minutes of aerobic exercise at least 4 times per week.  Losing weight if necessary.  Not smoking.  Limiting alcoholic beverages.  Learning ways to reduce stress. Your health care provider may prescribe medicine if lifestyle changes are not enough to get your blood pressure under control, and if one of the following is true:  You are 18-59 years of age and your systolic blood pressure is above 140.  You are 60 years of age or older, and your systolic blood pressure is above 150.  Your diastolic blood pressure is above 90.  You have diabetes, and your systolic blood pressure is over 140 or your diastolic blood pressure is over 90.  You have kidney disease and your blood pressure is above 140/90.  You have heart disease and your blood pressure is above 140/90. Your personal target blood pressure may vary depending on your medical conditions, your age, and other factors. HOME CARE INSTRUCTIONS    Have your blood pressure rechecked as directed by your health care provider.   Take medicines only as directed by your health care provider. Follow the directions carefully. Blood pressure medicines must be taken as prescribed. The medicine does not work as well when you skip doses. Skipping doses also puts you at risk for  problems.  Do not smoke.   Monitor your blood pressure at home as directed by your health care provider. SEEK MEDICAL CARE IF:   You think you are having a reaction to medicines taken.  You have recurrent headaches or feel dizzy.  You have swelling in your ankles.  You have trouble with your vision. SEEK IMMEDIATE MEDICAL CARE IF:  You develop a severe headache or confusion.  You have unusual weakness, numbness, or feel faint.  You have severe chest or abdominal pain.  You vomit repeatedly.  You have trouble breathing. MAKE SURE YOU:   Understand these instructions.  Will watch your condition.  Will get help right away if you are not doing well or get worse.   This information is not intended to replace advice given to you by your health care provider. Make sure you discuss any questions you have with your health care provider.   Document Released: 07/05/2005 Document Revised: 11/19/2014 Document Reviewed: 04/27/2013 Elsevier Interactive Patient Education 2016 [employer]  

## 2016-05-13 NOTE — Progress Notes (Signed)
Subjective:    Patient ID: Teresa Lozano, female    DOB: October 04, 1950, 65 y.o.   MRN: [id number]  HPI  Pt presents to the clinic today for 2 week follow up of HTN. She stopped her Lisinopril 2 weeks ago secondary to ongoing cough. She was switched to Amlodipine 10 mg daily. She is taking the medication as directed. She has noticed some mild swelling in her legs, but reports she can "deal with it". She denies chest pain or shortness of breath. Her BP today is 134/80.  Review of Systems      Past Medical History:  Diagnosis Date  . Allergy   . Chicken pox   . Frequent headaches   . History of kidney stones     Current Outpatient Prescriptions  Medication Sig Dispense Refill  . amLODipine (NORVASC) 10 MG tablet Take 1 tablet (10 mg total) by mouth daily. 30 tablet 11  . Aspirin-Acetaminophen-Caffeine (EXCEDRIN PO) Take 1-2 tablets by mouth as needed.     . cetirizine (ZYRTEC) 10 MG tablet Take 10 mg by mouth daily.    Marland Kitchen esomeprazole (NEXIUM) 20 MG capsule Take 20 mg by mouth daily at 12 noon.    . simvastatin (ZOCOR) 10 MG tablet Take 1 tablet (10 mg total) by mouth daily. 30 tablet 5   No current facility-administered medications for this visit.     Allergies  Allergen Reactions  . Lisinopril Cough    Family History  Problem Relation Age of Onset  . Hyperlipidemia Mother   . Hypertension Mother   . Prostate cancer Father   . Hyperlipidemia Father   . Heart disease Father   . Heart disease Brother   . Hypertension Sister   . Hypertension Maternal Grandmother     Social History   Social History  . Marital status: Widowed    Spouse name: N/A  . Number of children: N/A  . Years of education: N/A   Occupational History  . Not on file.   Social History Main Topics  . Smoking status: Never Smoker  . Smokeless tobacco: Never Used  . Alcohol use No  . Drug use: No  . Sexual activity: No   Other Topics Concern  . Not on file   Social History Narrative  .  No narrative on file     Constitutional: Denies fever, malaise, fatigue, headache or abrupt weight changes.  Respiratory: Denies difficulty breathing, shortness of breath, cough or sputum production.   Cardiovascular: Pt reports swelling in her lower extremities. Denies chest pain, chest tightness, palpitations or swelling in the hands.  Neurological: Denies dizziness, difficulty with memory, difficulty with speech or problems with balance and coordination.    No other specific complaints in a complete review of systems (except as listed in HPI above).  Objective:   Physical Exam   BP 134/80   Pulse 84   Temp 97.7 F (36.5 C) (Oral)   Wt 112 lb 12 oz (51.1 kg)   SpO2 98%   BMI 20.62 kg/m  Wt Readings from Last 3 Encounters:  05/13/16 112 lb 12 oz (51.1 kg)  03/02/16 114 lb (51.7 kg)  12/01/15 112 lb 8 oz (51 kg)    General: Appears her stated age, well developed, well nourished in NAD. Cardiovascular: Normal rate and rhythm. S1,S2 noted.  No murmur, rubs or gallops noted. No BLE edema. Pulmonary/Chest: Normal effort and positive vesicular breath sounds. No respiratory distress. No wheezes, rales or ronchi noted.  Neurological: Alert  and oriented.    BMET    Component Value Date/Time   NA 141 03/02/2016 1006   K 4.6 03/02/2016 1006   CL 110 03/02/2016 1006   CO2 24 03/02/2016 1006   GLUCOSE 102 (H) 03/02/2016 1006   BUN 24 (H) 03/02/2016 1006   CREATININE 1.18 03/02/2016 1006   CALCIUM 9.7 03/02/2016 1006   GFRNONAA 49 (L) 12/17/2009 0855   GFRAA (L) 12/17/2009 0855    59        The eGFR has been calculated using the MDRD equation. This calculation has not been validated in all clinical situations. eGFR's persistently <60 mL/min signify possible Chronic Kidney Disease.    Lipid Panel     Component Value Date/Time   CHOL 176 03/02/2016 1006   TRIG 128.0 03/02/2016 1006   HDL 49.50 03/02/2016 1006   CHOLHDL 4 03/02/2016 1006   VLDL 25.6 03/02/2016 1006     LDLCALC 101 (H) 03/02/2016 1006    CBC    Component Value Date/Time   WBC 6.0 11/10/2015 1130   RBC 4.50 11/10/2015 1130   HGB 14.4 11/10/2015 1130   HCT 43.4 11/10/2015 1130   PLT 277.0 11/10/2015 1130   MCV 96.3 11/10/2015 1130   MCHC 33.2 11/10/2015 1130   RDW 14.1 11/10/2015 1130   LYMPHSABS 2.3 12/17/2009 0855   MONOABS 0.7 12/17/2009 0855   EOSABS 0.2 12/17/2009 0855   BASOSABS 0.0 12/17/2009 0855    Hgb A1C No results found for: HGBA1C      Assessment & Plan:

## 2016-05-24 ENCOUNTER — Other Ambulatory Visit: Payer: Self-pay | Admitting: Internal Medicine

## 2016-05-27 ENCOUNTER — Other Ambulatory Visit: Payer: Self-pay

## 2016-05-27 MED ORDER — AMLODIPINE BESYLATE 10 MG PO TABS: 10.0000 mg | ORAL_TABLET | Freq: Every day | ORAL | 11 refills | Status: DC

## 2016-05-27 NOTE — Telephone Encounter (Signed)
Pt left v/m that [facility] does not have amlodipine refill/ per med list appears the rx done on 05/13/16 was marked to phone in and was not sent electronically. Medication phoned to [facility] as instructed. rx will be ready for pick up today at 6 pm. Unable to reach pt by phone but left detailed message per DPR.

## 2016-09-02 ENCOUNTER — Ambulatory Visit: Payer: Self-pay | Admitting: Internal Medicine

## 2016-09-19 ENCOUNTER — Other Ambulatory Visit: Payer: Self-pay | Admitting: Internal Medicine

## 2016-10-21 ENCOUNTER — Encounter: Payer: Self-pay | Admitting: Internal Medicine

## 2016-10-21 ENCOUNTER — Ambulatory Visit (INDEPENDENT_AMBULATORY_CARE_PROVIDER_SITE_OTHER): Payer: Self-pay | Admitting: Internal Medicine

## 2016-10-21 VITALS — BP 118/74 | HR 123 | Temp 98.7°F | Wt 109.5 lb

## 2016-10-21 DIAGNOSIS — R1013 Epigastric pain: Secondary | ICD-10-CM

## 2016-10-21 DIAGNOSIS — R112 Nausea with vomiting, unspecified: Secondary | ICD-10-CM

## 2016-10-21 DIAGNOSIS — R195 Other fecal abnormalities: Secondary | ICD-10-CM

## 2016-10-21 LAB — CBC
HCT: 43.1 % (ref 36.0–46.0)
Hemoglobin: 14.1 g/dL (ref 12.0–15.0)
MCHC: 32.7 g/dL (ref 30.0–36.0)
MCV: 95.9 fl (ref 78.0–100.0)
PLATELETS: 276 10*3/uL (ref 150.0–400.0)
RBC: 4.49 Mil/uL (ref 3.87–5.11)
RDW: 13.1 % (ref 11.5–15.5)
WBC: 13.2 10*3/uL — ABNORMAL HIGH (ref 4.0–10.5)

## 2016-10-21 LAB — COMPREHENSIVE METABOLIC PANEL
ALBUMIN: 4.4 g/dL (ref 3.5–5.2)
ALT: 9 U/L (ref 0–35)
AST: 15 U/L (ref 0–37)
Alkaline Phosphatase: 84 U/L (ref 39–117)
BUN: 17 mg/dL (ref 6–23)
CALCIUM: 10 mg/dL (ref 8.4–10.5)
CHLORIDE: 103 meq/L (ref 96–112)
CO2: 32 meq/L (ref 19–32)
Creatinine, Ser: 1.2 mg/dL (ref 0.40–1.20)
GFR: 47.99 mL/min — ABNORMAL LOW (ref >60.00)
Glucose, Bld: 106 mg/dL — ABNORMAL HIGH (ref 70–99)
POTASSIUM: 4.2 meq/L (ref 3.5–5.1)
SODIUM: 143 meq/L (ref 135–145)
Total Bilirubin: 0.4 mg/dL (ref 0.2–1.2)
Total Protein: 7.4 g/dL (ref 6.0–8.3)

## 2016-10-21 LAB — LIPASE: LIPASE: 20 U/L (ref 11.0–59.0)

## 2016-10-21 LAB — AMYLASE: Amylase: 57 U/L (ref 27–131)

## 2016-10-21 LAB — H. PYLORI ANTIBODY, IGG: H PYLORI IGG: NEGATIVE

## 2016-10-21 MED ORDER — ONDANSETRON HCL 4 MG PO TABS: 4.0000 mg | ORAL_TABLET | Freq: Three times a day (TID) | ORAL | 0 refills | Status: DC | PRN

## 2016-10-21 MED ORDER — HYDROCODONE-ACETAMINOPHEN 5-325 MG PO TABS: 1.0000 | ORAL_TABLET | Freq: Three times a day (TID) | ORAL | 0 refills | Status: DC | PRN

## 2016-10-21 NOTE — Progress Notes (Signed)
Subjective:    Patient ID: Teresa Lozano, female    DOB: [birth year], 66 y.o.   MRN: [id number]  HPI  Pt presents to the clinic today with c/o epigastric pain and intermittent nausea and vomiting. This started 2 months ago. She describes the pain as like a vice. This occurs for 3-4 days at a time, and then resolves spontaneously for a few days. She has noticed that salads, peas, pinto beans, raisins make it flare. Eating makes the pain worse. She denies blood in her vomit. Her bowels normally are moving okay, but with this episode, she has had some loose stools. She denies blood in her stool. She does have a history of ulcers and reports this feels similar. She is taking Nexium daily for GERD but is not taking anything else OTC. She does not have a GI doctor.  Review of Systems      Past Medical History:  Diagnosis Date  . Allergy   . Chicken pox   . Frequent headaches   . History of kidney stones     Current Outpatient Prescriptions  Medication Sig Dispense Refill  . amLODipine (NORVASC) 10 MG tablet Take 1 tablet (10 mg total) by mouth daily. 30 tablet 11  . Aspirin-Acetaminophen-Caffeine (EXCEDRIN PO) Take 1-2 tablets by mouth as needed.     . cetirizine (ZYRTEC) 10 MG tablet Take 10 mg by mouth daily.    Marland Kitchen esomeprazole (NEXIUM) 20 MG capsule Take 20 mg by mouth daily at 12 noon.    . simvastatin (ZOCOR) 10 MG tablet TAKE 1 TABLET (10 MG TOTAL) BY MOUTH DAILY. 30 tablet 5   No current facility-administered medications for this visit.     Allergies  Allergen Reactions  . Lisinopril Cough    Family History  Problem Relation Age of Onset  . Hyperlipidemia Mother   . Hypertension Mother   . Prostate cancer Father   . Hyperlipidemia Father   . Heart disease Father   . Heart disease Brother   . Hypertension Sister   . Hypertension Maternal Grandmother     Social History   Social History  . Marital status: Widowed    Spouse name: N/A  . Number of children: N/A  .  Years of education: N/A   Occupational History  . Not on file.   Social History Main Topics  . Smoking status: Never Smoker  . Smokeless tobacco: Never Used  . Alcohol use No  . Drug use: No  . Sexual activity: No   Other Topics Concern  . Not on file   Social History Narrative  . No narrative on file     Constitutional: Pt reports weight loss. Denies fever, malaise, fatigue, headache.  Respiratory: Denies difficulty breathing, shortness of breath, cough or sputum production.   Cardiovascular: Denies chest pain, chest tightness, palpitations or swelling in the hands or feet.  Gastrointestinal: Pt reports epigastric pain, nausea, vomiting and loose stools. Denies bloating, constipation, diarrhea or blood in the stool.  GU: Denies urgency, frequency, pain with urination, burning sensation, blood in urine, odor or discharge.  No other specific complaints in a complete review of systems (except as listed in HPI above).  Objective:   Physical Exam  BP 118/74   Pulse (!) 123   Temp 98.7 F (37.1 C) (Oral)   Wt 109 lb 8 oz (49.7 kg)   SpO2 98%   BMI 20.03 kg/m  Wt Readings from Last 3 Encounters:  10/21/16 109 lb 8 oz (  49.7 kg)  05/13/16 112 lb 12 oz (51.1 kg)  03/02/16 114 lb (51.7 kg)    General: Appears her stated age, well developed, well nourished in NAD. Cardiovascular: Tachycardic with normal rhythm.  Pulmonary/Chest: Normal effort and positive vesicular breath sounds. No respiratory distress. No wheezes, rales or ronchi noted.  Abdomen: Soft and tender in the epigastric region. Hypoactive bowel sounds. No distention or masses noted.   BMET    Component Value Date/Time   NA 141 03/02/2016 1006   K 4.6 03/02/2016 1006   CL 110 03/02/2016 1006   CO2 24 03/02/2016 1006   GLUCOSE 102 (H) 03/02/2016 1006   BUN 24 (H) 03/02/2016 1006   CREATININE 1.18 03/02/2016 1006   CALCIUM 9.7 03/02/2016 1006   GFRNONAA 49 (L) 12/17/2009 0855   GFRAA (L) 12/17/2009 0855     59        The eGFR has been calculated using the MDRD equation. This calculation has not been validated in all clinical situations. eGFR's persistently <60 mL/min signify possible Chronic Kidney Disease.    Lipid Panel     Component Value Date/Time   CHOL 176 03/02/2016 1006   TRIG 128.0 03/02/2016 1006   HDL 49.50 03/02/2016 1006   CHOLHDL 4 03/02/2016 1006   VLDL 25.6 03/02/2016 1006   LDLCALC 101 (H) 03/02/2016 1006    CBC    Component Value Date/Time   WBC 6.0 11/10/2015 1130   RBC 4.50 11/10/2015 1130   HGB 14.4 11/10/2015 1130   HCT 43.4 11/10/2015 1130   PLT 277.0 11/10/2015 1130   MCV 96.3 11/10/2015 1130   MCHC 33.2 11/10/2015 1130   RDW 14.1 11/10/2015 1130   LYMPHSABS 2.3 12/17/2009 0855   MONOABS 0.7 12/17/2009 0855   EOSABS 0.2 12/17/2009 0855   BASOSABS 0.0 12/17/2009 0855    Hgb A1C No results found for: HGBA1C          Assessment & Plan:   Epigastric Pain, Nausea, Vomiting and Loose Stools:  Will check CBC, CMET, Amylase, Lipase and H Pylori today Continue Nexium eRx for Zofran  RX for Hydrocodone for severe pain May need referral to GI for upper GI  Will follow up after labs, RTC as needed or if symptoms persist or worsen Marlene Pfluger, NP

## 2016-10-21 NOTE — Progress Notes (Signed)
Pre visit review using our clinic review tool, if applicable. No additional management support is needed unless otherwise documented below in the visit note. vo

## 2016-10-21 NOTE — Patient Instructions (Signed)

## 2016-10-28 ENCOUNTER — Encounter: Payer: Self-pay | Admitting: Physician Assistant

## 2016-11-04 ENCOUNTER — Ambulatory Visit (INDEPENDENT_AMBULATORY_CARE_PROVIDER_SITE_OTHER): Payer: Self-pay | Admitting: Physician Assistant

## 2016-11-04 ENCOUNTER — Encounter: Payer: Self-pay | Admitting: Physician Assistant

## 2016-11-04 ENCOUNTER — Other Ambulatory Visit: Payer: Self-pay

## 2016-11-04 VITALS — BP 130/68 | HR 78 | Resp 18 | Ht 62.0 in | Wt 110.0 lb

## 2016-11-04 DIAGNOSIS — R1013 Epigastric pain: Secondary | ICD-10-CM

## 2016-11-04 DIAGNOSIS — R112 Nausea with vomiting, unspecified: Secondary | ICD-10-CM

## 2016-11-04 DIAGNOSIS — K279 Peptic ulcer, site unspecified, unspecified as acute or chronic, without hemorrhage or perforation: Secondary | ICD-10-CM

## 2016-11-04 MED ORDER — OMEPRAZOLE 40 MG PO CPDR: 40.0000 mg | DELAYED_RELEASE_CAPSULE | Freq: Two times a day (BID) | ORAL | 3 refills | Status: DC

## 2016-11-04 MED ORDER — HYDROCODONE-ACETAMINOPHEN 5-325 MG PO TABS: 1.0000 | ORAL_TABLET | Freq: Three times a day (TID) | ORAL | 0 refills | Status: DC | PRN

## 2016-11-04 NOTE — Patient Instructions (Signed)
We have sent the following medications to your pharmacy for you to pick up at your convenience:  OMpreazole 40 mg twice a day  Stop Excedrin   Call your PCP for an alternative pain medication.   You have been scheduled for an endoscopy. Please follow written instructions given to you at your visit today. If you use inhalers (even only as needed), please bring them with you on the day of your procedure. Your physician has requested that you go to www.startemmi.com and enter the access code given to you at your visit today. This web site gives a general overview about your procedure. However, you should still follow specific instructions given to you by our office regarding your preparation for the procedure.

## 2016-11-04 NOTE — Progress Notes (Signed)
Agree with assessment and plan as outlined.  

## 2016-11-04 NOTE — Telephone Encounter (Signed)
Pt left v/m; pt was seen at GI this morning and was advised to stop excedrin; pt request hydrocodone apap. Call when ready for pick up. Pt last seen and rx last printed #15 on 10/21/16. Pt has endoscopy scheduled on 11/09/16.

## 2016-11-04 NOTE — Progress Notes (Signed)
Subjective:    Patient ID: Teresa Lozano, female    DOB: [birth year], 66 y.o.   MRN: [id number]  HPI Teresa Lozano is a pleasant 66 year old white female, new to GI today referred by Teresa Silversmith NP for evaluation of upper abdominal discomfort and vomiting. Patient relates previous colonoscopy about 15 years ago done by Dr. Amedeo Lozano which was a normal exam. She has not had prior EGD. She relates ulcer type symptoms in 2011 and underwent an upper GI in 2011 which did show a pyloric ulcer. She has history of hypertension, frequent headaches and GERD. Her current symptoms have been present over the past 6 weeks. She says initially when she started feeling that she had about 20 days where she had vomiting on a daily basis. She says someday she would vomit just once a day other days a couple of times per day. She also has developed epigastric and left upper quadrant pain which she describes as gnawing and burning in nature "like there is a hole". Generally she feels better after vomiting and then symptoms will eventually returned. She has not lost any weight. She has been able to eat though complains of a lot of burping with meals. She has also had an increase in reflux type symptoms. No melena or hematochezia and says her bowel movements have been fairly regular. Generally when she is vomiting she's not bringing up undigested food nor blood. She has been on OTC Nexium long-term once daily for reflux symptoms. She also takes generally 6-8 Excedrin every day and has done this for long-term both for headaches and joint aches.  Recent labs done 10/21/2016 WBC of 13 see met unremarkable H. pylori IgG was positive.  Review of Systems Pertinent positive and negative review of systems were noted in the above HPI section.  All other review of systems was otherwise negative.  Outpatient Encounter Prescriptions as of 11/04/2016  Medication Sig  . amLODipine (NORVASC) 10 MG tablet Take 1 tablet (10 mg total) by mouth  daily.  . Aspirin-Acetaminophen-Caffeine (EXCEDRIN PO) Take 1-2 tablets by mouth as needed.   . cetirizine (ZYRTEC) 10 MG tablet Take 10 mg by mouth daily.  Marland Kitchen esomeprazole (NEXIUM) 20 MG capsule Take 20 mg by mouth daily at 12 noon.  . ondansetron (ZOFRAN) 4 MG tablet Take 1 tablet (4 mg total) by mouth every 8 (eight) hours as needed.  . simvastatin (ZOCOR) 10 MG tablet TAKE 1 TABLET (10 MG TOTAL) BY MOUTH DAILY.  . [DISCONTINUED] HYDROcodone-acetaminophen (NORCO/VICODIN) 5-325 MG tablet Take 1 tablet by mouth every 8 (eight) hours as needed for moderate pain.  Marland Kitchen omeprazole (PRILOSEC) 40 MG capsule Take 1 capsule (40 mg total) by mouth 2 (two) times daily.   No facility-administered encounter medications on file as of 11/04/2016.    Allergies  Allergen Reactions  . Lisinopril Cough   Patient Active Problem List   Diagnosis Date Noted  . HLD (hyperlipidemia) 12/01/2015  . Essential hypertension 11/10/2015  . Frequent headaches 11/10/2015  . GERD (gastroesophageal reflux disease) 11/10/2015  . Seasonal allergies 11/10/2015   Social History   Social History  . Marital status: Widowed    Spouse name: N/A  . Number of children: N/A  . Years of education: N/A   Occupational History  . Not on file.   Social History Main Topics  . Smoking status: Never Smoker  . Smokeless tobacco: Never Used  . Alcohol use No  . Drug use: No  . Sexual activity: No  Other Topics Concern  . Not on file   Social History Narrative  . No narrative on file    Teresa Lozano's family history includes Heart disease in her brother and father; Hyperlipidemia in her father and mother; Hypertension in her maternal grandmother, mother, and sister; Prostate cancer in her father.      Objective:    Vitals:   11/04/16 0916  BP: 130/68  Pulse: 78  Resp: 18    Physical Exam well-developed older white female in no acute distress, pleasant blood pressure 130/68 pulse 78, BMI 20.12. HEENT; nontraumatic  normocephalic EOMI PERRLA sclera anicteric, Cardiovascular; regular rate and rhythm with S1-S2 no murmur or gallop, Pulmonary;lear bilaterally, Abdomen; soft, she has mild epigastric tenderness no guarding or rebound no palpable mass or hepatosplenomegaly, bowel sounds present, Rectal ;exam not done, Ext; no clubbing cyanosis or edema skin warm and dry, Neuropsych ;mood and affect appropriate       Assessment & Plan:   #19 66 year old white female with 6 week history of upper abdominal discomfort and almost daily vomiting. This is in the setting of previous diagnosis of pyloric ulcer 2011 and chronic high-dose Excedrin use. I suspect she has recurrent peptic ulcer disease perhaps with partial outlet obstruction  #2 hypertension #3 frequent headaches #4 arthritis  Plan; patient is asked to discontinue Excedrin use, she needs to completely wean herself off. She will request an alternative analgesic from her PCP, i.e. Tramadol Start omeprazole 40 mg by mouth twice a day She will continue Zofran 4 mg every 6 hours when necessary for nausea Will schedule for EGD with Dr. Havery Lozano next week. Procedure discussed in detail with patient including risks and benefits and she is agreeable to proceed She will also need follow-up colonoscopy, she would like to wait until her Medicare kicks in within the next several months. Very soft bland diet Teresa S Esterwood PA-C 11/04/2016   Cc: Teresa Fenton, NP

## 2016-11-04 NOTE — Telephone Encounter (Signed)
RX printed and signed and placed in MYD box 

## 2016-11-05 NOTE — Telephone Encounter (Signed)
Pt is aware and has already picked up Rx

## 2016-11-09 ENCOUNTER — Other Ambulatory Visit: Payer: Self-pay

## 2016-11-09 ENCOUNTER — Encounter: Payer: Self-pay | Admitting: Gastroenterology

## 2016-11-09 ENCOUNTER — Telehealth: Payer: Self-pay | Admitting: Gastroenterology

## 2016-11-09 ENCOUNTER — Ambulatory Visit (AMBULATORY_SURGERY_CENTER): Payer: Self-pay | Admitting: Gastroenterology

## 2016-11-09 VITALS — BP 115/69 | HR 92 | Temp 97.1°F | Resp 20 | Ht 62.0 in | Wt 110.0 lb

## 2016-11-09 DIAGNOSIS — K311 Adult hypertrophic pyloric stenosis: Secondary | ICD-10-CM

## 2016-11-09 DIAGNOSIS — R112 Nausea with vomiting, unspecified: Secondary | ICD-10-CM

## 2016-11-09 DIAGNOSIS — K2950 Unspecified chronic gastritis without bleeding: Secondary | ICD-10-CM

## 2016-11-09 MED ORDER — SODIUM CHLORIDE 0.9 % IV SOLN: 500.0000 mL | INTRAVENOUS | Status: DC

## 2016-11-09 MED ORDER — LANSOPRAZOLE 30 MG PO TBDP: 30.0000 mg | ORAL_TABLET | Freq: Two times a day (BID) | ORAL | 1 refills | Status: DC

## 2016-11-09 NOTE — Op Note (Signed)
[place] Patient Name: Teresa Lozano Procedure Date: 11/09/2016 12:08 PM MRN: [id number] Endoscopist: Remo Lipps P. Armbruster MD, MD Age: 66 Referring MD:  Date of Birth: Jan 09, 1951 Gender: Female Account #: [id number] Procedure:                Upper GI endoscopy Indications:              Nausea with vomiting, history of pyloric ulcer Medicines:                Monitored Anesthesia Care Procedure:                Pre-Anesthesia Assessment:                           - Prior to the procedure, a History and Physical                            was performed, and patient medications and                            allergies were reviewed. The patient's tolerance of                            previous anesthesia was also reviewed. The risks                            and benefits of the procedure and the sedation                            options and risks were discussed with the patient.                            All questions were answered, and informed consent                            was obtained. Prior Anticoagulants: The patient has                            taken no previous anticoagulant or antiplatelet                            agents. ASA Grade Assessment: II - A patient with                            mild systemic disease. After reviewing the risks                            and benefits, the patient was deemed in                            satisfactory condition to undergo the procedure.                           After obtaining informed consent, the endoscope was  passed under direct vision. Throughout the                            procedure, the patient's blood pressure, pulse, and                            oxygen saturations were monitored continuously. The                            Endoscope was introduced through the mouth, and                            advanced to the pylorus. The upper GI endoscopy was    accomplished without difficulty. The patient                            tolerated the procedure well. Scope In: Scope Out: Findings:                 Esophagogastric landmarks were identified: the                            Z-line was found at 35 cm, the gastroesophageal                            junction was found at 35 cm and the upper extent of                            the gastric folds was found at 35 cm from the                            incisors.                           The exam of the esophagus was otherwise normal.                           Residual food was found in the gastric body which                            could not be cleared. Retroflexed views of the                            cardiac / fundus were normal.                           A benign-appearing, intrinsic severe stenosis was                            found at the pylorus. This was not able to be                            traversed with the endoscope. The lumen was a few  mm wide. Biopsies were taken with a cold forceps                            for histology from within the stricture. Complications:            No immediate complications. Estimated blood loss:                            Minimal. Estimated Blood Loss:     Estimated blood loss was minimal. Impression:               - Esophagogastric landmarks identified.                           - Normal esophagus                           - Some residual food noted in the stomach without                            fluid.                           - Severe gastric stenosis was found at the pylorus.                            Biopsied.                           Overall, patient has partial gastric outlet                            obstruction from suspected PUD but need to rule out                            malignancy.                           I discussed options with patient after the                            procedure. She  has had symptoms for at least 6                            weeks and tolerates liquids without any problems,                            variable toleration of solids. She wishes to avoid                            hospital admission if at all possible. Recommendation:           - Patient has a contact number available for                            emergencies. The signs and symptoms of potential  delayed complications were discussed with the                            patient. Return to normal activities tomorrow.                            Written discharge instructions were provided to the                            patient.                           - Liquid diet only - NO SOLIDs                           - Continue present medications.                           - Change PPI to prevacid solutab 30mg  twice daily                           - Sleep with head of bed elevated 45 degrees                           - No aspirin, ibuprofen, naproxen, or other                            non-steroidal anti-inflammatory drugs.                           - CT abdomen to rule out malignancy, assess extent                            of stricture                           - Await pathology results.                           - Patient may need repeat endoscopy in the near                            future pending CT results                           - If any intolerance of liquids, you will need to                            be admitted for further treatment [place]. Armbruster MD, MD 11/09/2016 1:08:07 PM This report has been signed electronically.

## 2016-11-09 NOTE — Progress Notes (Signed)
No problems noted in the recovery room. maw 

## 2016-11-09 NOTE — Telephone Encounter (Signed)
Routed to Dr. Armbruster. 

## 2016-11-09 NOTE — Patient Instructions (Signed)
YOU HAD AN ENDOSCOPIC PROCEDURE TODAY: Refer to the procedure report and other information in the discharge instructions given to you for any specific questions about what was found during the examination. If this information does not answer your questions, please call [place] office at [phone] to clarify.   YOU SHOULD EXPECT: Some feelings of bloating in the abdomen. Passage of more gas than usual. Walking can help get rid of the air that was put into your GI tract during the procedure and reduce the bloating. If you had a lower endoscopy (such as a colonoscopy or flexible sigmoidoscopy) you may notice spotting of blood in your stool or on the toilet paper. Some abdominal soreness may be present for a day or two, also.  DIET: Your first meal following the procedure should be a light meal and then it is ok to progress to your normal diet. A half-sandwich or bowl of soup is an example of a good first meal. Heavy or fried foods are harder to digest and may make you feel nauseous or bloated. Drink plenty of fluids but you should avoid alcoholic beverages for 24 hours. If you had a esophageal dilation, please see attached instructions for diet.    ACTIVITY: Your care partner should take you home directly after the procedure. You should plan to take it easy, moving slowly for the rest of the day. You can resume normal activity the day after the procedure however YOU SHOULD NOT DRIVE, use power tools, machinery or perform tasks that involve climbing or major physical exertion for 24 hours (because of the sedation medicines used during the test).   SYMPTOMS TO REPORT IMMEDIATELY: A gastroenterologist can be reached at any hour. Please call [phone]  for any of the following symptoms:   Following upper endoscopy (EGD, EUS, ERCP, esophageal dilation) Vomiting of blood or coffee ground material  New, significant abdominal pain  New, significant chest pain or pain under the shoulder blades  Painful or  persistently difficult swallowing  New shortness of breath  Black, tarry-looking or red, bloody stools  FOLLOW UP:  If any biopsies were taken you will be contacted by phone or by letter within the next 1-3 weeks. Call [phone]  if you have not heard about the biopsies in 3 weeks.  Please also call with any specific questions about appointments or follow up tests.  

## 2016-11-09 NOTE — Progress Notes (Signed)
Called to room to assist during endoscopic procedure.  Patient ID and intended procedure confirmed with present staff. Received instructions for my participation in the procedure from the performing physician.  

## 2016-11-09 NOTE — Progress Notes (Signed)
TO PACU  Pt awake alert. Report to RN

## 2016-11-09 NOTE — Telephone Encounter (Signed)
Patient advised okay to take other medications, also let her know that I will be calling her to let her know about CT scan.

## 2016-11-09 NOTE — Telephone Encounter (Signed)
Yes that's fine, thanks

## 2016-11-10 ENCOUNTER — Telehealth: Payer: Self-pay

## 2016-11-10 NOTE — Telephone Encounter (Signed)
  Follow up Call-  Call back number 11/09/2016  Post procedure Call Back phone  # [phone]  Permission to leave phone message Yes  Some recent data might be hidden     Patient questions:  Do you have a fever, pain , or abdominal swelling? No. Pain Score  0 *  Have you tolerated food without any problems? Yes, patient is on a liquid diet until scan is completed.  Have you been able to return to your normal activities? Yes.    Do you have any questions about your discharge instructions: Diet   No. Medications  No. Follow up visit  No.  Do you have questions or concerns about your Care? No.  Actions: * If pain score is 4 or above: No action needed, pain <4.

## 2016-11-12 ENCOUNTER — Other Ambulatory Visit: Payer: Self-pay

## 2016-11-12 DIAGNOSIS — R1013 Epigastric pain: Secondary | ICD-10-CM

## 2016-11-12 MED ORDER — HYDROCODONE-ACETAMINOPHEN 5-325 MG PO TABS: 1.0000 | ORAL_TABLET | Freq: Three times a day (TID) | ORAL | 0 refills | Status: DC | PRN

## 2016-11-12 NOTE — Telephone Encounter (Signed)
RX printed and signed and placed in MYD box 

## 2016-11-12 NOTE — Telephone Encounter (Signed)
Rx left in front office for pick up and pt is aware  

## 2016-11-12 NOTE — Telephone Encounter (Signed)
Pt left v/m requesting rx hydrocodone apap. Call when ready for pick up. Last printed # 15 on 10/31/16. Last seen 10/21/16. Pt has had the endoscopy and scheduled for CT; pt is on liquid diet and hurting and request to pick up rx 11/12/16.

## 2016-11-16 ENCOUNTER — Encounter (HOSPITAL_COMMUNITY): Payer: Self-pay

## 2016-11-16 ENCOUNTER — Other Ambulatory Visit: Payer: Self-pay

## 2016-11-16 ENCOUNTER — Telehealth: Payer: Self-pay | Admitting: Gastroenterology

## 2016-11-16 ENCOUNTER — Ambulatory Visit (HOSPITAL_COMMUNITY)
Admission: RE | Admit: 2016-11-16 | Discharge: 2016-11-16 | Disposition: A | Discharge status: Home / Self Care | Payer: Self-pay | Source: Ambulatory Visit | Attending: Gastroenterology | Admitting: Gastroenterology

## 2016-11-16 DIAGNOSIS — N200 Calculus of kidney: Secondary | ICD-10-CM | POA: Insufficient documentation

## 2016-11-16 DIAGNOSIS — K7689 Other specified diseases of liver: Secondary | ICD-10-CM | POA: Insufficient documentation

## 2016-11-16 DIAGNOSIS — K311 Adult hypertrophic pyloric stenosis: Secondary | ICD-10-CM

## 2016-11-16 DIAGNOSIS — R935 Abnormal findings on diagnostic imaging of other abdominal regions, including retroperitoneum: Secondary | ICD-10-CM

## 2016-11-16 DIAGNOSIS — N281 Cyst of kidney, acquired: Secondary | ICD-10-CM | POA: Insufficient documentation

## 2016-11-16 HISTORY — PX: ESOPHAGOGASTRODUODENOSCOPY: SHX1529

## 2016-11-16 MED ORDER — IOPAMIDOL (ISOVUE-300) INJECTION 61%
INTRAVENOUS | Status: AC
  Administered 2016-11-16: 80 mL
  Filled 2016-11-16: qty 100

## 2016-11-16 NOTE — Telephone Encounter (Signed)
Patient wanted to know about cost, I told her I have no idea. Suggested that she contact [facility] billing for radiology to see if they could help her with pricing.

## 2016-11-18 ENCOUNTER — Other Ambulatory Visit: Payer: Self-pay | Admitting: Internal Medicine

## 2016-11-18 DIAGNOSIS — R1013 Epigastric pain: Secondary | ICD-10-CM

## 2016-11-18 NOTE — Telephone Encounter (Signed)
Last filled 10/21/16--please advise

## 2016-11-19 ENCOUNTER — Telehealth: Payer: Self-pay

## 2016-11-19 DIAGNOSIS — R1013 Epigastric pain: Secondary | ICD-10-CM

## 2016-11-19 NOTE — Telephone Encounter (Signed)
We are not going to refill this any longer. She needs to transition to Tylenol 650 TID

## 2016-11-19 NOTE — Telephone Encounter (Signed)
Pt left v/m requesting rx hydrocodone apap. Call when ready for pick up. rx last printed #15 on 11/12/16; last seen 10/21/16. Pt said continues trying to find out what is wrong with her stomach.

## 2016-11-23 ENCOUNTER — Ambulatory Visit (HOSPITAL_COMMUNITY)
Admission: RE | Admit: 2016-11-23 | Discharge: 2016-11-23 | Disposition: A | Discharge status: Home / Self Care | Payer: Self-pay | Source: Ambulatory Visit | Attending: Gastroenterology | Admitting: Gastroenterology

## 2016-11-23 ENCOUNTER — Other Ambulatory Visit: Payer: Self-pay | Admitting: Gastroenterology

## 2016-11-23 DIAGNOSIS — K259 Gastric ulcer, unspecified as acute or chronic, without hemorrhage or perforation: Secondary | ICD-10-CM | POA: Insufficient documentation

## 2016-11-23 DIAGNOSIS — K311 Adult hypertrophic pyloric stenosis: Secondary | ICD-10-CM | POA: Insufficient documentation

## 2016-11-23 DIAGNOSIS — R935 Abnormal findings on diagnostic imaging of other abdominal regions, including retroperitoneum: Secondary | ICD-10-CM

## 2016-11-25 NOTE — Telephone Encounter (Signed)
Patient called about her medication.  Patient said she's happy to change to another medication.  Please call patient back at [phone].

## 2016-11-25 NOTE — Telephone Encounter (Signed)
Pt is aware as instructed 

## 2016-11-26 ENCOUNTER — Telehealth: Payer: Self-pay

## 2016-11-26 NOTE — Telephone Encounter (Signed)
Left message for patient asking her to call our office back. Left detailed message that this can become an emergency situation and needs to be addressed ASAP.

## 2016-11-26 NOTE — Telephone Encounter (Signed)
-----   Message from Algernon Huxley, RN sent at 11/26/2016 11:53 AM EDT ----- Regarding: FW: Surgical Referral Left message for pt to call back. ----- Message ----- From: Manus Gunning, MD Sent: 11/25/2016   4:44 PM To: Algernon Huxley, RN Subject: RE: Surgical Referral                          No she cannot wait that long. She is on a liquid diet and has a pinhole of a lumen exiting her stomach, she is at risk for complete obstruction which would be an emergency. I think she needs to proceed now with consult to have this treated. Thanks   ----- Message ----- From: Algernon Huxley, RN Sent: 11/25/2016   3:34 PM To: Manus Gunning, MD Subject: RE: Surgical Referral                          Spoke with pt to see which she would prefer [facility] or [facility]. Pt states she will have Medicare as of 03/19/17 and wants to know if she can wait until then to be referred to [facility]. Please advise.  ----- Message ----- From: Manus Gunning, MD Sent: 11/25/2016   3:12 PM To: Algernon Huxley, RN Subject: RE: Surgical Referral                          Yes please. If she can't be seen locally, yes she can be seen wherever accepts her case. If you could make the consult ASAP that would be appreciated. Thanks much  ----- Message ----- From: Algernon Huxley, RN Sent: 11/25/2016   1:47 PM To: Manus Gunning, MD Subject: Surgical Referral                              Dr. Havery Moros,  I was helping Almyra Free with results. You want this pt to be sent for surgery. She has no insurance. Do you want [place] to try and set her up at [facility] or [facility]  Thanks, [employer]

## 2016-11-26 NOTE — Telephone Encounter (Signed)
Referral faxed to [facility] at: [phone]. Asked patient to call our office back mid week if she has not heard from them.

## 2016-12-06 ENCOUNTER — Telehealth: Payer: Self-pay | Admitting: Gastroenterology

## 2016-12-06 DIAGNOSIS — M1990 Unspecified osteoarthritis, unspecified site: Secondary | ICD-10-CM | POA: Insufficient documentation

## 2016-12-06 DIAGNOSIS — K259 Gastric ulcer, unspecified as acute or chronic, without hemorrhage or perforation: Secondary | ICD-10-CM | POA: Insufficient documentation

## 2016-12-06 DIAGNOSIS — K311 Adult hypertrophic pyloric stenosis: Secondary | ICD-10-CM | POA: Insufficient documentation

## 2016-12-06 NOTE — Telephone Encounter (Signed)
Thanks for the update Teresa Lozano, I called Teresa Lozano, as she was instructed previously to stop all NSAIDs. She stated she did this for a period of time but then resumed them given her ongoing headaches. I was not aware of this. She was instructed to completely abstain from all NSAIDs for her headaches. She has daily headaches and needs an alternative preventative regimen. She has been referred to Neurology for this but has a long wait to see them, recommend she follow up with her primary care to discuss alternative preventative headache regimen in the interim, but she needs to stay off all NSAIDs.  Surgery had recommended H pylori testing. She's already had a negative serology and biopsies without H pylori. I think stool study is likely to be low yield, and she is on PPI for her ulcer which she can't stop, thus the stool study will likely be negative. We can take care of a repeat EGD for her, recommend she hold all NSAIDS for now and repeat EGD in 3 weeks or so.   I would prefer EGD to be at the hospital if possible given they have more options for balloon dilation if needed. Her follow up with surgery is scheduled for June 18th, I would like her exam to be done before then. We will have to check schedule at the hospital to find a time.   Otherwise she will continue liquid diet and PPI BID. I remain concerned her stricture is not amenable to endoscopic dilation but agree she needs to be off all NSAIDs prior to consideration for surgery. Thanks

## 2016-12-06 NOTE — Telephone Encounter (Signed)
Spoke to patient she had her visit with [facility] surgeons today, you should be able to see it in [place]. Told her that she is not a candidate for surgery, want her to come off of her Excedrin, switch to Fioricet. They are going to check her for H. Pylori and want to repeat the EGD to see if swelling has decreased.

## 2016-12-07 ENCOUNTER — Encounter: Payer: Self-pay | Admitting: Internal Medicine

## 2016-12-07 NOTE — Telephone Encounter (Signed)
That's perfect. Thanks Almyra Free

## 2016-12-07 NOTE — Telephone Encounter (Signed)
Spoke to patient, she is scheduled at [facility] on 6/12 for repeat EGD possible dilation. She has pre-visit scheduled also. Patient will continue liquid diet and PPI twice daily.

## 2016-12-09 ENCOUNTER — Ambulatory Visit (AMBULATORY_SURGERY_CENTER): Payer: Self-pay

## 2016-12-09 VITALS — Ht 61.5 in | Wt 110.2 lb

## 2016-12-09 DIAGNOSIS — K311 Adult hypertrophic pyloric stenosis: Secondary | ICD-10-CM

## 2016-12-09 NOTE — Progress Notes (Signed)
No allergies to eggs or soy No diet meds No home oxygen No past problemsw with anesthesia  Registered emmi

## 2016-12-27 ENCOUNTER — Encounter (HOSPITAL_COMMUNITY): Payer: Self-pay | Admitting: *Deleted

## 2016-12-28 ENCOUNTER — Encounter (HOSPITAL_COMMUNITY)
Admission: RE | Disposition: A | Discharge status: Home / Self Care | Payer: Self-pay | Source: Ambulatory Visit | Attending: Gastroenterology

## 2016-12-28 ENCOUNTER — Ambulatory Visit (HOSPITAL_COMMUNITY)
Admission: RE | Admit: 2016-12-28 | Discharge: 2016-12-28 | Disposition: A | Discharge status: Home / Self Care | Payer: Self-pay | Source: Ambulatory Visit | Attending: Gastroenterology | Admitting: Gastroenterology

## 2016-12-28 ENCOUNTER — Other Ambulatory Visit: Payer: Self-pay

## 2016-12-28 ENCOUNTER — Ambulatory Visit (HOSPITAL_COMMUNITY): Payer: Self-pay | Admitting: Anesthesiology

## 2016-12-28 ENCOUNTER — Encounter (HOSPITAL_COMMUNITY): Payer: Self-pay | Admitting: *Deleted

## 2016-12-28 DIAGNOSIS — K311 Adult hypertrophic pyloric stenosis: Secondary | ICD-10-CM

## 2016-12-28 DIAGNOSIS — I10 Essential (primary) hypertension: Secondary | ICD-10-CM | POA: Insufficient documentation

## 2016-12-28 DIAGNOSIS — Z79899 Other long term (current) drug therapy: Secondary | ICD-10-CM | POA: Insufficient documentation

## 2016-12-28 DIAGNOSIS — K2950 Unspecified chronic gastritis without bleeding: Secondary | ICD-10-CM | POA: Insufficient documentation

## 2016-12-28 DIAGNOSIS — K219 Gastro-esophageal reflux disease without esophagitis: Secondary | ICD-10-CM | POA: Insufficient documentation

## 2016-12-28 DIAGNOSIS — K3189 Other diseases of stomach and duodenum: Secondary | ICD-10-CM | POA: Insufficient documentation

## 2016-12-28 DIAGNOSIS — Z8711 Personal history of peptic ulcer disease: Secondary | ICD-10-CM | POA: Insufficient documentation

## 2016-12-28 DIAGNOSIS — Z791 Long term (current) use of non-steroidal anti-inflammatories (NSAID): Secondary | ICD-10-CM | POA: Insufficient documentation

## 2016-12-28 DIAGNOSIS — Z888 Allergy status to other drugs, medicaments and biological substances status: Secondary | ICD-10-CM | POA: Insufficient documentation

## 2016-12-28 HISTORY — PX: ESOPHAGOGASTRODUODENOSCOPY (EGD) WITH PROPOFOL: SHX5813

## 2016-12-28 SURGERY — ESOPHAGOGASTRODUODENOSCOPY (EGD) WITH PROPOFOL
Anesthesia: Monitor Anesthesia Care

## 2016-12-28 MED ORDER — PROPOFOL 10 MG/ML IV BOLUS
INTRAVENOUS | Status: AC
  Filled 2016-12-28: qty 20

## 2016-12-28 MED ORDER — PROPOFOL 10 MG/ML IV BOLUS
INTRAVENOUS | Status: DC | PRN
  Administered 2016-12-28: 20 mg via INTRAVENOUS
  Administered 2016-12-28: 10 mg via INTRAVENOUS
  Administered 2016-12-28: 20 mg via INTRAVENOUS
  Administered 2016-12-28: 10 mg via INTRAVENOUS
  Administered 2016-12-28 (×2): 20 mg via INTRAVENOUS
  Administered 2016-12-28: 10 mg via INTRAVENOUS
  Administered 2016-12-28: 20 mg via INTRAVENOUS
  Administered 2016-12-28 (×5): 10 mg via INTRAVENOUS
  Administered 2016-12-28 (×3): 20 mg via INTRAVENOUS

## 2016-12-28 MED ORDER — LACTATED RINGERS IV SOLN
INTRAVENOUS | Status: DC
  Administered 2016-12-28: 09:00:00 via INTRAVENOUS

## 2016-12-28 MED ORDER — SODIUM CHLORIDE 0.9 % IV SOLN: INTRAVENOUS | Status: DC

## 2016-12-28 SURGICAL SUPPLY — 15 items

## 2016-12-28 NOTE — Anesthesia Postprocedure Evaluation (Signed)
Anesthesia Post Note  Patient: Teresa Lozano  Procedure(s) Performed: Procedure(s) (LRB): ESOPHAGOGASTRODUODENOSCOPY (EGD) WITH PROPOFOL (N/A)     Patient location during evaluation: PACU Anesthesia Type: MAC Level of consciousness: awake and alert Pain management: pain level controlled Vital Signs Assessment: post-procedure vital signs reviewed and stable Respiratory status: spontaneous breathing, nonlabored ventilation, respiratory function stable and patient connected to nasal cannula oxygen Cardiovascular status: stable and blood pressure returned to baseline Anesthetic complications: no    Last Vitals:  Vitals:   12/28/16 0950 12/28/16 1000  BP: 126/66 119/72  Pulse: 70 74  Resp: 16 17  Temp:      Last Pain:  Vitals:   12/28/16 0942  TempSrc: Oral                 Hyden Soley S

## 2016-12-28 NOTE — H&P (Signed)
   HPI :  66 y/o female with history of PUD related to NSAIDs, with pyloric stenosis and partial GOO, here for follow up EGD. Previously seen by general surgery to discuss surgical therapy, she had resumed NSAIDs without our knowledge, they recommended complete cessation and repeat EGD to see if there was interval improvement. She is tolerating soft diet. No vomiting, weight stable.  Past Medical History:  Diagnosis Date  . Allergy   . Arthritis   . Chicken pox   . Frequent headaches   . GERD (gastroesophageal reflux disease)   . History of kidney stones   . Hyperlipidemia   . Hypertension   . Osteoporosis      Past Surgical History:  Procedure Laterality Date  . CERVICAL FUSION  2000  . EYE SURGERY    . TONSILLECTOMY     Family History  Problem Relation Age of Onset  . Hyperlipidemia Mother   . Hypertension Mother   . Prostate cancer Father   . Hyperlipidemia Father   . Heart disease Father   . Heart disease Brother   . Hypertension Sister   . Hypertension Maternal Grandmother   . Colon cancer Neg Hx    Social History  Substance Use Topics  . Smoking status: Never Smoker  . Smokeless tobacco: Never Used  . Alcohol use No   Current Facility-Administered Medications  Medication Dose Route Frequency Provider Last Rate Last Dose  . lactated ringers infusion   Intravenous Continuous Armbruster, Renelda Loma, MD 20 mL/hr at 12/28/16 9702     Allergies  Allergen Reactions  . Lisinopril Cough     Review of Systems: All systems reviewed and negative except where noted in HPI.    No results found.  Physical Exam: BP (!) 133/59   Pulse 78   Temp 97.9 F (36.6 C) (Oral)   Resp 16   Ht 5' 1.5" (1.562 m)   Wt 110 lb 3.2 oz (50 kg)   SpO2 100%   BMI 20.48 kg/m  Constitutional: Pleasant,well-developed, female in no acute distress. HEENT: Normocephalic and atraumatic. Conjunctivae are normal. No scleral icterus. Neck supple.  Cardiovascular: Normal rate, regular  rhythm.  Pulmonary/chest: Effort normal and breath sounds normal. No wheezing, rales or rhonchi. Abdominal: Soft, nondistended, nontender. . There are no masses palpable. No hepatomegaly. Extremities: no edema   ASSESSMENT AND PLAN: 66 y/o female with history of PUD due to NSAIDs, severe pyloric stricture with partial GOO. Here for EGD for reassessment, possible dilation if amenable. Discussed risks / benefits, she wishes to proceed.   [place], MD [facility] Pager [phone]

## 2016-12-28 NOTE — Op Note (Addendum)
[facility] Patient Name: Teresa Lozano Procedure Date: 12/28/2016 MRN: [id number] Attending MD: Carlota Raspberry. Armbruster MD, MD Date of Birth: 1951-07-06 CSN: [id number] Age: 65 Admit Type: Outpatient Procedure:                Upper GI endoscopy Indications:              Follow-up of pyloric stenosis secondary to PUD /                            NSAID use, severe stenosis roughly 1.5cm long with                            angulated lumen 1.35m diameter on barium study, has                            stopped NSAIDs, here for repeat EGD per request of                            general surgery to see if lumen has opened since                            she stopped NSAIDs. She reports tolerating soft diet Providers:                SRemo LippsP. Armbruster MD, MD, PCleda Daub RN,                            CElspeth [employer]., Technician, SKensettAlday                            CRNA, CRNA Referring MD:              Medicines:                Monitored Anesthesia Care Complications:            No immediate complications. Estimated blood loss:                            Minimal. Estimated Blood Loss:     Estimated blood loss was minimal. Procedure:                Pre-Anesthesia Assessment:                           - Prior to the procedure, a History and Physical                            was performed, and patient medications and                            allergies were reviewed. The patient's tolerance of                            previous anesthesia was also reviewed. The risks  and benefits of the procedure and the sedation                            options and risks were discussed with the patient.                            All questions were answered, and informed consent                            was obtained. Prior Anticoagulants: The patient has                            taken no previous anticoagulant or antiplatelet                agents. ASA Grade Assessment: II - A patient with                            mild systemic disease. After reviewing the risks                            and benefits, the patient was deemed in                            satisfactory condition to undergo the procedure.                           After obtaining informed consent, the endoscope was                            passed under direct vision. Throughout the                            procedure, the patient's blood pressure, pulse, and                            oxygen saturations were monitored continuously. The                            Endoscope was introduced through the mouth, and                            advanced to the pylorus. The upper GI endoscopy was                            accomplished without difficulty. The patient                            tolerated the procedure well. Scope In: Scope Out: Findings:      Esophagogastric landmarks were identified: the Z-line was found at 35       cm, the gastroesophageal junction was found at 35 cm and the upper       extent of the gastric folds was found at 35 cm from the incisors.      The exam of  the esophagus was otherwise normal.      Some residual food / fluid was found in the gastric fundus and in the       gastric body, prohibiting complete exam of this area      A benign-appearing, intrinsic severe stenosis was found at the pylorus,       with a pinhole lumen, roughly 27m in diameter. This was non-traversed       and could not visualize through the lumen of it. Biopsies were taken       with a cold forceps for histology of the stricture and duodenal bulb.      The exam of the stomach was otherwise normal.      Biopsies were taken with a cold forceps in the gastric body, at the       incisura and in the gastric antrum for Helicobacter pylori testing. Impression:               - Esophagogastric landmarks identified.                           - Normal  esophagus                           - Residual food in the proximal stomach.                           - Severe benign appearing gastric stenosis was                            found at the pylorus. Biopsied. Given the severity                            of the stenosis and angulated lumen noted on barium                            study, this lesion is high risk for balloon                            dilation and was not attempted.                           - Biopsies were taken with a cold forceps for                            Helicobacter pylori testing. Moderate Sedation:      No moderate sedation, case performed with MAC Recommendation:           - Patient has a contact number available for                            emergencies. The signs and symptoms of potential                            delayed complications were discussed with the                            patient. Return  to normal activities tomorrow.                            Written discharge instructions were provided to the                            patient.                           - Liquid diet only.                           - Sleep with head of bed elevated, no PO intake                            within 3 hours of sleeping                           - Continue present medications (prevacid)                           - No aspirin, ibuprofen, naproxen, or other                            non-steroidal anti-inflammatory drugs.                           - Await pathology results.                           - Follow up with general surgery for                            reconsideration for surgical treatment. I think                            this patient may be best served by surgical therapy                            given the severity of this stenosis, which is high                            risk for balloon dilation                           - If you develop intolerance to liquids at any                             time, with vomiting, please contact us for                            immediate assessment Procedure Code(s):        --- Professional ---                           43239, 52, Esophagogastroduodenoscopy, flexible,  transoral; with biopsy, single or multiple Diagnosis Code(s):        --- Professional ---                           K31.1, Adult hypertrophic pyloric stenosis CPT copyright 2016 American Medical Association. All rights reserved. The codes documented in this report are preliminary and upon coder review may  be revised to meet current compliance requirements. Remo Lipps P. Armbruster MD, MD 12/28/2016 9:50:28 AM This report has been signed electronically. Number of Addenda: 0

## 2016-12-28 NOTE — Discharge Instructions (Signed)
YOU HAD AN ENDOSCOPIC PROCEDURE TODAY: Refer to the procedure report and other information in the discharge instructions given to you for any specific questions about what was found during the examination. If this information does not answer your questions, please call [place] office at [phone] to clarify.  ° °YOU SHOULD EXPECT: Some feelings of bloating in the abdomen. Passage of more gas than usual. Walking can help get rid of the air that was put into your GI tract during the procedure and reduce the bloating. If you had a lower endoscopy (such as a colonoscopy or flexible sigmoidoscopy) you may notice spotting of blood in your stool or on the toilet paper. Some abdominal soreness may be present for a day or two, also. ° °DIET: Your first meal following the procedure should be a light meal and then it is ok to progress to your normal diet. A half-sandwich or bowl of soup is an example of a good first meal. Heavy or fried foods are harder to digest and may make you feel nauseous or bloated. Drink plenty of fluids but you should avoid alcoholic beverages for 24 hours. If you had a esophageal dilation, please see attached instructions for diet.   ° °ACTIVITY: Your care partner should take you home directly after the procedure. You should plan to take it easy, moving slowly for the rest of the day. You can resume normal activity the day after the procedure however YOU SHOULD NOT DRIVE, use power tools, machinery or perform tasks that involve climbing or major physical exertion for 24 hours (because of the sedation medicines used during the test).  ° °SYMPTOMS TO REPORT IMMEDIATELY: °A gastroenterologist can be reached at any hour. Please call [phone]  for any of the following symptoms:  °Following lower endoscopy (colonoscopy, flexible sigmoidoscopy) °Excessive amounts of blood in the stool  °Significant tenderness, worsening of abdominal pains  °Swelling of the abdomen that is new, acute  °Fever of 100° or  higher  °Following upper endoscopy (EGD, EUS, ERCP, esophageal dilation) °Vomiting of blood or coffee ground material  °New, significant abdominal pain  °New, significant chest pain or pain under the shoulder blades  °Painful or persistently difficult swallowing  °New shortness of breath  °Black, tarry-looking or red, bloody stools ° °FOLLOW UP:  °If any biopsies were taken you will be contacted by phone or by letter within the next 1-3 weeks. Call [phone]  if you have not heard about the biopsies in 3 weeks.  °Please also call with any specific questions about appointments or follow up tests. ° °

## 2016-12-28 NOTE — Anesthesia Preprocedure Evaluation (Signed)
Anesthesia Evaluation  Patient identified by MRN, date of birth, ID band Patient awake    Reviewed: Allergy & Precautions, NPO status , Patient's Chart, lab work & pertinent test results  Airway Mallampati: II  TM Distance: >3 FB Neck ROM: Full    Dental no notable dental hx.    Pulmonary neg pulmonary ROS,    Pulmonary exam normal breath sounds clear to auscultation       Cardiovascular hypertension, Normal cardiovascular exam Rhythm:Regular Rate:Normal     Neuro/Psych negative neurological ROS  negative psych ROS   GI/Hepatic Neg liver ROS, GERD  Medicated,  Endo/Other  negative endocrine ROS  Renal/GU negative Renal ROS  negative genitourinary   Musculoskeletal negative musculoskeletal ROS (+)   Abdominal   Peds negative pediatric ROS (+)  Hematology negative hematology ROS (+)   Anesthesia Other Findings   Reproductive/Obstetrics negative OB ROS                             Anesthesia Physical Anesthesia Plan  ASA: II  Anesthesia Plan: MAC   Post-op Pain Management:    Induction: Intravenous  PONV Risk Score and Plan: 0  Airway Management Planned:   Additional Equipment:   Intra-op Plan:   Post-operative Plan:   Informed Consent: I have reviewed the patients History and Physical, chart, labs and discussed the procedure including the risks, benefits and alternatives for the proposed anesthesia with the patient or authorized representative who has indicated his/her understanding and acceptance.   Dental advisory given  Plan Discussed with: CRNA and Surgeon  Anesthesia Plan Comments:         Anesthesia Quick Evaluation

## 2016-12-28 NOTE — Transfer of Care (Signed)
Immediate Anesthesia Transfer of Care Note  Patient: Teresa Lozano  Procedure(s) Performed: Procedure(s): ESOPHAGOGASTRODUODENOSCOPY (EGD) WITH PROPOFOL (N/A)  Patient Location: PACU  Anesthesia Type:MAC  Level of Consciousness: sedated  Airway & Oxygen Therapy: Patient Spontanous Breathing and Patient connected to nasal cannula oxygen  Post-op Assessment: Report given to RN and Post -op Vital signs reviewed and stable  Post vital signs: Reviewed and stable  Last Vitals:  Vitals:   12/28/16 0825  BP: (!) 133/59  Pulse: 78  Resp: 16  Temp: 36.6 C    Last Pain:  Vitals:   12/28/16 0825  TempSrc: Oral         Complications: No apparent anesthesia complications

## 2016-12-28 NOTE — Interval H&P Note (Signed)
History and Physical Interval Note:  12/28/2016 9:12 AM  Teresa Lozano  has presented today for surgery, with the diagnosis of partial gastric outlet obstruction  The various methods of treatment have been discussed with the patient and family. After consideration of risks, benefits and other options for treatment, the patient has consented to  Procedure(s): ESOPHAGOGASTRODUODENOSCOPY (EGD) WITH PROPOFOL (N/A) BALLOON DILATION (N/A) as a surgical intervention .  The patient's history has been reviewed, patient examined, no change in status, stable for surgery.  I have reviewed the patient's chart and labs.  Questions were answered to the patient's satisfaction.     [name]

## 2016-12-29 ENCOUNTER — Encounter (HOSPITAL_COMMUNITY): Payer: Self-pay | Admitting: Gastroenterology

## 2017-02-20 ENCOUNTER — Other Ambulatory Visit: Payer: Self-pay | Admitting: Internal Medicine

## 2017-02-20 DIAGNOSIS — R1013 Epigastric pain: Secondary | ICD-10-CM

## 2017-02-21 NOTE — Telephone Encounter (Signed)
Last refilled 11/18/16 after 10/21/16 OV for epigastric pain... Please advise

## 2017-03-14 ENCOUNTER — Telehealth: Payer: Self-pay | Admitting: Gastroenterology

## 2017-03-14 NOTE — Telephone Encounter (Signed)
Patient will have insurance starting in September, she wants to see a Psychologist, sport and exercise in [place] and is requesting a referral. Patient did not contact Dr. Ailene Ravel at [facility]. Please advise.

## 2017-03-14 NOTE — Telephone Encounter (Signed)
I had previously discussed her case with Dr. Ailene Ravel and he agreed to proceed with surgery. If she would prefer to have this done in [place], we can refer her to Dr. Johney Maine or Dr. Donne Hazel for pyloric stenosis - can you let me know who she is scheduled with so I can discuss her case with them? thanks

## 2017-03-15 NOTE — Telephone Encounter (Signed)
Faxed referral to [facility], attn: Dr. Johney Maine or Dr. Donne Hazel. Left patient a voice message that I have faxed over her information and she should expect a call from them to schedule appointment. I also left her their phone number incase she has not heard back from them.

## 2017-03-18 ENCOUNTER — Telehealth: Payer: Self-pay

## 2017-03-18 NOTE — Telephone Encounter (Signed)
Appointment with Dr. Johney Maine at [place] on 04/11/17 at 11:00, patient aware.

## 2017-03-20 ENCOUNTER — Other Ambulatory Visit: Payer: Self-pay | Admitting: Internal Medicine

## 2017-03-24 ENCOUNTER — Ambulatory Visit (INDEPENDENT_AMBULATORY_CARE_PROVIDER_SITE_OTHER): Payer: Medicare Other | Admitting: Internal Medicine

## 2017-03-24 ENCOUNTER — Encounter: Payer: Self-pay | Admitting: Internal Medicine

## 2017-03-24 VITALS — BP 112/78 | HR 79 | Temp 97.9°F | Wt 118.0 lb

## 2017-03-24 DIAGNOSIS — M2550 Pain in unspecified joint: Secondary | ICD-10-CM | POA: Diagnosis not present

## 2017-03-24 NOTE — Patient Instructions (Signed)
Arthritis Arthritis means joint pain. It can also mean joint disease. A joint is a place where bones come together. People who have arthritis may have:  Red joints.  Swollen joints.  Stiff joints.  Warm joints.  A fever.  A feeling of being sick.  Follow these instructions at home: Pay attention to any changes in your symptoms. Take these actions to help with your pain and swelling. Medicines  Take over-the-counter and prescription medicines only as told by your doctor.  Do not take aspirin for pain if your doctor says that you may have gout. Activity  Rest your joint if your doctor tells you to.  Avoid activities that make the pain worse.  Exercise your joint regularly as told by your doctor. Try doing exercises like: ? Swimming. ? Water aerobics. ? Biking. ? Walking. Joint Care   If your joint is swollen, keep it raised (elevated) if told by your doctor.  If your joint feels stiff in the morning, try taking a warm shower.  If you have diabetes, do not apply heat without asking your doctor.  If told, apply heat to the joint: ? Put a towel between the joint and the hot pack or heating pad. ? Leave the heat on the area for 20-30 minutes.  If told, apply ice to the joint: ? Put ice in a plastic bag. ? Place a towel between your skin and the bag. ? Leave the ice on for 20 minutes, 2-3 times per day.  Keep all follow-up visits as told by your doctor. Contact a doctor if:  The pain gets worse.  You have a fever. Get help right away if:  You have very bad pain in your joint.  You have swelling in your joint.  Your joint is red.  Many joints become painful and swollen.  You have very bad back pain.  Your leg is very weak.  You cannot control your pee (urine) or poop (stool). This information is not intended to replace advice given to you by your health care provider. Make sure you discuss any questions you have with your health care provider. Document  Released: 09/29/2009 Document Revised: 12/11/2015 Document Reviewed: 09/30/2014 Elsevier Interactive Patient Education  2018 Elsevier Inc.  

## 2017-03-24 NOTE — Progress Notes (Signed)
Subjective:    Patient ID: Teresa Lozano, female    DOB: September 25, 1950, 66 y.o.   MRN: [id number]  HPI  Pt presents to the clinic today with multiple joint pains. She reports this started 2-3 months ago. She is having pain in her shoulders, fingers, ankles and toes. She describes the pain as sore and achy. She denies numbness or tingling. She does have intermittent swelling of her ankles. She has no history of arthritis but reports is runs in her family. She denies history of autoimmune disorder. She has taken Tylenol Arthritis with minimal relief.  Review of Systems      Past Medical History:  Diagnosis Date  . Allergy   . Arthritis   . Chicken pox   . Frequent headaches   . GERD (gastroesophageal reflux disease)   . History of kidney stones   . Hyperlipidemia   . Hypertension   . Osteoporosis     Current Outpatient Prescriptions  Medication Sig Dispense Refill  . acetaminophen (TYLENOL) 650 MG CR tablet Take 1,300 mg by mouth 2 (two) times daily.    Marland Kitchen amLODipine (NORVASC) 10 MG tablet Take 1 tablet (10 mg total) by mouth daily. 30 tablet 11  . cetirizine (ZYRTEC) 10 MG tablet Take 10 mg by mouth daily.    . lansoprazole (PREVACID SOLUTAB) 30 MG disintegrating tablet Take 1 tablet (30 mg total) by mouth 2 (two) times daily. (Patient not taking: Reported on 12/23/2016) 90 tablet 1  . ondansetron (ZOFRAN) 4 MG tablet TAKE 1 TABLET (4 MG TOTAL) BY MOUTH EVERY 8 (EIGHT) HOURS AS NEEDED. 20 tablet 0  . simvastatin (ZOCOR) 10 MG tablet TAKE 1 TABLET (10 MG TOTAL) BY MOUTH DAILY. 30 tablet 1   No current facility-administered medications for this visit.     Allergies  Allergen Reactions  . Lisinopril Cough    Family History  Problem Relation Age of Onset  . Hyperlipidemia Mother   . Hypertension Mother   . Prostate cancer Father   . Hyperlipidemia Father   . Heart disease Father   . Heart disease Brother   . Hypertension Sister   . Hypertension Maternal Grandmother   .  Colon cancer Neg Hx     Social History   Social History  . Marital status: Widowed    Spouse name: N/A  . Number of children: N/A  . Years of education: N/A   Occupational History  . Not on file.   Social History Main Topics  . Smoking status: Never Smoker  . Smokeless tobacco: Never Used  . Alcohol use No  . Drug use: No  . Sexual activity: No   Other Topics Concern  . Not on file   Social History Narrative  . No narrative on file     Constitutional: Denies fever, malaise, fatigue, headache or abrupt weight changes.  Respiratory: Denies difficulty breathing, shortness of breath, cough or sputum production.   Cardiovascular: Pt reports intermittent swelling of feet. Denies chest pain, chest tightness, palpitations or swelling in the hands.  Musculoskeletal: Pt reports joint pains. Denies decrease in range of motion, difficulty with gait, muscle pain or joint swelling.    No other specific complaints in a complete review of systems (except as listed in HPI above).  Objective:   Physical Exam   BP 112/78   Pulse 79   Temp 97.9 F (36.6 C) (Oral)   Wt 118 lb (53.5 kg)   SpO2 97%   BMI 21.93 kg/m  Wt Readings from Last 3 Encounters:  03/24/17 118 lb (53.5 kg)  12/28/16 110 lb 3.2 oz (50 kg)  12/09/16 110 lb 3.2 oz (50 kg)    General: Appears her stated age, well developed, well nourished in NAD. Cardiovascular: Normal rate and rhythm. Pedal pulse 2+ bilaterally. No JVD or BLE edema.  Pulmonary/Chest: Normal effort and positive vesicular breath sounds. No respiratory distress. No wheezes, rales or ronchi noted.  Musculoskeletal: Normal internal and external range of motion of bilateral shoulders. No pain with palpation of either shoulder. Negative drop can test. Normal flexion, extension, abduction and adduction of the fingers. Joint enlargement noted of the fingers.  No signs of joint swelling. Hand grips equal. Normal flexion, extension and rotation of the  ankles. No difficulty with gait.  Neurological: Alert and oriented. Sensation intact.   BMET    Component Value Date/Time   NA 143 10/21/2016 1025   K 4.2 10/21/2016 1025   CL 103 10/21/2016 1025   CO2 32 10/21/2016 1025   GLUCOSE 106 (H) 10/21/2016 1025   BUN 17 10/21/2016 1025   CREATININE 1.20 10/21/2016 1025   CALCIUM 10.0 10/21/2016 1025   GFRNONAA 49 (L) 12/17/2009 0855   GFRAA (L) 12/17/2009 0855    59        The eGFR has been calculated using the MDRD equation. This calculation has not been validated in all clinical situations. eGFR's persistently <60 mL/min signify possible Chronic Kidney Disease.    Lipid Panel     Component Value Date/Time   CHOL 176 03/02/2016 1006   TRIG 128.0 03/02/2016 1006   HDL 49.50 03/02/2016 1006   CHOLHDL 4 03/02/2016 1006   VLDL 25.6 03/02/2016 1006   LDLCALC 101 (H) 03/02/2016 1006    CBC    Component Value Date/Time   WBC 13.2 (H) 10/21/2016 1025   RBC 4.49 10/21/2016 1025   HGB 14.1 10/21/2016 1025   HCT 43.1 10/21/2016 1025   PLT 276.0 10/21/2016 1025   MCV 95.9 10/21/2016 1025   MCHC 32.7 10/21/2016 1025   RDW 13.1 10/21/2016 1025   LYMPHSABS 2.3 12/17/2009 0855   MONOABS 0.7 12/17/2009 0855   EOSABS 0.2 12/17/2009 0855   BASOSABS 0.0 12/17/2009 0855    Hgb A1C No results found for: HGBA1C         Assessment & Plan:   Multiple Joint Pain:  Will try to hold statin x 2 weeks to see if this is the cause Update me in 2 weeks via mychart If no improvement, consider xray of shoulders and hands along with autoimmune workup  Return precautions discussed Webb Silversmith, NP

## 2017-03-25 ENCOUNTER — Other Ambulatory Visit: Payer: Self-pay | Admitting: Internal Medicine

## 2017-03-25 DIAGNOSIS — Z1231 Encounter for screening mammogram for malignant neoplasm of breast: Secondary | ICD-10-CM

## 2017-04-01 ENCOUNTER — Encounter: Payer: Self-pay | Admitting: Internal Medicine

## 2017-04-01 ENCOUNTER — Ambulatory Visit: Payer: PRIVATE HEALTH INSURANCE

## 2017-04-01 DIAGNOSIS — E2839 Other primary ovarian failure: Secondary | ICD-10-CM

## 2017-04-01 DIAGNOSIS — Z13820 Encounter for screening for osteoporosis: Secondary | ICD-10-CM

## 2017-04-05 ENCOUNTER — Ambulatory Visit
Admission: RE | Admit: 2017-04-05 | Discharge: 2017-04-05 | Disposition: A | Discharge status: Home / Self Care | Payer: Medicare Other | Source: Ambulatory Visit | Attending: Internal Medicine | Admitting: Internal Medicine

## 2017-04-05 DIAGNOSIS — Z1231 Encounter for screening mammogram for malignant neoplasm of breast: Secondary | ICD-10-CM

## 2017-04-06 ENCOUNTER — Encounter: Payer: Self-pay | Admitting: Internal Medicine

## 2017-04-07 ENCOUNTER — Ambulatory Visit (INDEPENDENT_AMBULATORY_CARE_PROVIDER_SITE_OTHER)
Admission: RE | Admit: 2017-04-07 | Discharge: 2017-04-07 | Disposition: A | Discharge status: Home / Self Care | Payer: Medicare Other | Source: Ambulatory Visit | Attending: Internal Medicine | Admitting: Internal Medicine

## 2017-04-07 ENCOUNTER — Encounter: Payer: Self-pay | Admitting: Internal Medicine

## 2017-04-07 ENCOUNTER — Ambulatory Visit (INDEPENDENT_AMBULATORY_CARE_PROVIDER_SITE_OTHER): Payer: Medicare Other | Admitting: Internal Medicine

## 2017-04-07 VITALS — BP 116/78 | HR 84 | Temp 98.2°F | Wt 120.0 lb

## 2017-04-07 DIAGNOSIS — M79672 Pain in left foot: Secondary | ICD-10-CM | POA: Diagnosis not present

## 2017-04-07 DIAGNOSIS — M2550 Pain in unspecified joint: Secondary | ICD-10-CM

## 2017-04-07 DIAGNOSIS — M19041 Primary osteoarthritis, right hand: Secondary | ICD-10-CM | POA: Diagnosis not present

## 2017-04-07 LAB — SEDIMENTATION RATE: Sed Rate: 11 mm/hr (ref 0–30)

## 2017-04-07 NOTE — Patient Instructions (Signed)
Joint Pain Joint pain can be caused by many things. The joint can be bruised, infected, weak from aging, or sore from exercise. The pain will probably go away if you follow your doctor's instructions for home care. If your joint pain continues, more tests may be needed to help find the cause of your condition. Follow these instructions at home: Watch your condition for any changes. Follow these instructions as told to lessen the pain that you are feeling:  Take medicines only as told by your doctor.  Rest the sore joint for as long as told by your doctor. If your doctor tells you to, raise (elevate) the painful joint above the level of your heart while you are sitting or lying down.  Do not do things that cause pain or make the pain worse.  If told, put ice on the painful area: ? Put ice in a plastic bag. ? Place a towel between your skin and the bag. ? Leave the ice on for 20 minutes, 2-3 times per day.  Wear an elastic bandage, splint, or sling as told by your doctor. Loosen the bandage or splint if your fingers or toes lose feeling (become numb) and tingle, or if they turn cold and blue.  Begin exercising or stretching the joint as told by your doctor. Ask your doctor what types of exercise are safe for you.  Keep all follow-up visits as told by your doctor. This is important.  Contact a doctor if:  Your pain gets worse and medicine does not help it.  Your joint pain does not get better in 3 days.  You have more bruising or swelling.  You have a fever.  You lose 10 pounds (4.5 kg) or more without trying. Get help right away if:  You are not able to move the joint.  Your fingers or toes become numb or they turn cold and blue. This information is not intended to replace advice given to you by your health care provider. Make sure you discuss any questions you have with your health care provider. Document Released: 06/23/2009 Document Revised: 12/11/2015 Document Reviewed:  04/16/2014 Elsevier Interactive Patient Education  2018 Elsevier Inc.  

## 2017-04-07 NOTE — Progress Notes (Signed)
Subjective:    Patient ID: Teresa Lozano, female    DOB: 02-15-1951, 66 y.o.   MRN: [id number]  HPI  Pt presents to the clinic today to follow up multiple joint pain. She was seen 9/6 for the same. We tried to hold her statin x 2 weeks but she reports this did not provide and relief. Most of her joint pain is in her left shoulder, bilateral elbows, hands and feet. She reports the pain is achy. She denies joint swelling. She has difficulty opening bottles or cans. She can not take NSAID's due to stomach issues, she is following with GI. She has tried Tylenol Arthritis with some relief. She has no history of autoimmune disorders.  Review of Systems      Past Medical History:  Diagnosis Date  . Allergy   . Arthritis   . Chicken pox   . Frequent headaches   . GERD (gastroesophageal reflux disease)   . History of kidney stones   . Hyperlipidemia   . Hypertension   . Osteoporosis     Current Outpatient Prescriptions  Medication Sig Dispense Refill  . acetaminophen (TYLENOL) 650 MG CR tablet Take 1,300 mg by mouth 2 (two) times daily.    Marland Kitchen amLODipine (NORVASC) 10 MG tablet Take 1 tablet (10 mg total) by mouth daily. 30 tablet 11  . cetirizine (ZYRTEC) 10 MG tablet Take 10 mg by mouth daily.    . lansoprazole (PREVACID SOLUTAB) 30 MG disintegrating tablet Take 1 tablet (30 mg total) by mouth 2 (two) times daily. 90 tablet 1  . Magnesium 500 MG CAPS Take 1 capsule by mouth daily.    Marland Kitchen omeprazole (PRILOSEC) 40 MG capsule     . ondansetron (ZOFRAN) 4 MG tablet TAKE 1 TABLET (4 MG TOTAL) BY MOUTH EVERY 8 (EIGHT) HOURS AS NEEDED. 20 tablet 0  . simvastatin (ZOCOR) 10 MG tablet TAKE 1 TABLET (10 MG TOTAL) BY MOUTH DAILY. 30 tablet 1   No current facility-administered medications for this visit.     Allergies  Allergen Reactions  . Lisinopril Cough    Family History  Problem Relation Age of Onset  . Hyperlipidemia Mother   . Hypertension Mother   . Prostate cancer Father   .  Hyperlipidemia Father   . Heart disease Father   . Heart disease Brother   . Hypertension Sister   . Hypertension Maternal Grandmother   . Colon cancer Neg Hx     Social History   Social History  . Marital status: Widowed    Spouse name: N/A  . Number of children: N/A  . Years of education: N/A   Occupational History  . Not on file.   Social History Main Topics  . Smoking status: Never Smoker  . Smokeless tobacco: Never Used  . Alcohol use No  . Drug use: No  . Sexual activity: No   Other Topics Concern  . Not on file   Social History Narrative  . No narrative on file     Constitutional: Denies fever, malaise, fatigue, headache or abrupt weight changes.  Musculoskeletal: Pt reports joint pain. Denies decrease in range of motion, difficulty with gait, muscle pain or joint swelling.    No other specific complaints in a complete review of systems (except as listed in HPI above).  Objective:   Physical Exam   BP 116/78   Pulse 84   Temp 98.2 F (36.8 C) (Oral)   Wt 120 lb (54.4 kg)  SpO2 99%   BMI 22.31 kg/m  Wt Readings from Last 3 Encounters:  04/07/17 120 lb (54.4 kg)  03/24/17 118 lb (53.5 kg)  12/28/16 110 lb 3.2 oz (50 kg)    General: Appears her stated age, well developed, well nourished in NAD. Musculoskeletal: Normal internal and external rotation of bilateral elbows. No pain with palpation of the left elbow. Normal flexion, extension and rotation of bilateral elbows. No pain with palpation of either elbow. Normal flexion, extension and rotation of the wrist. Normal flexion, extension, abduction and adduction of the fingers bilaterally. Joint enlargement noted in multiple fingers. Normal flexion, extension and rotation of the ankles. Strength 5/5 BUE/BLE. No joint swelling noted.  BMET    Component Value Date/Time   NA 143 10/21/2016 1025   K 4.2 10/21/2016 1025   CL 103 10/21/2016 1025   CO2 32 10/21/2016 1025   GLUCOSE 106 (H) 10/21/2016  1025   BUN 17 10/21/2016 1025   CREATININE 1.20 10/21/2016 1025   CALCIUM 10.0 10/21/2016 1025   GFRNONAA 49 (L) 12/17/2009 0855   GFRAA (L) 12/17/2009 0855    59        The eGFR has been calculated using the MDRD equation. This calculation has not been validated in all clinical situations. eGFR's persistently <60 mL/min signify possible Chronic Kidney Disease.    Lipid Panel     Component Value Date/Time   CHOL 176 03/02/2016 1006   TRIG 128.0 03/02/2016 1006   HDL 49.50 03/02/2016 1006   CHOLHDL 4 03/02/2016 1006   VLDL 25.6 03/02/2016 1006   LDLCALC 101 (H) 03/02/2016 1006    CBC    Component Value Date/Time   WBC 13.2 (H) 10/21/2016 1025   RBC 4.49 10/21/2016 1025   HGB 14.1 10/21/2016 1025   HCT 43.1 10/21/2016 1025   PLT 276.0 10/21/2016 1025   MCV 95.9 10/21/2016 1025   MCHC 32.7 10/21/2016 1025   RDW 13.1 10/21/2016 1025   LYMPHSABS 2.3 12/17/2009 0855   MONOABS 0.7 12/17/2009 0855   EOSABS 0.2 12/17/2009 0855   BASOSABS 0.0 12/17/2009 0855    Hgb A1C No results found for: HGBA1C         Assessment & Plan:   Multiple Joint Pain:  Will xray right hand and left foot today, I suspect OA Will check ESR, RF and ANA Pending labs, will discuss with GUI about starting low dose antiinflammatory with use of PPI  Will follow up after labs and xrays, return precautions discussed Webb Silversmith, NP

## 2017-04-08 LAB — ANA: Anti Nuclear Antibody(ANA): NEGATIVE

## 2017-04-08 LAB — RHEUMATOID FACTOR

## 2017-04-11 ENCOUNTER — Ambulatory Visit: Payer: Self-pay | Admitting: Surgery

## 2017-04-11 ENCOUNTER — Telehealth: Payer: Self-pay | Admitting: Internal Medicine

## 2017-04-11 DIAGNOSIS — K257 Chronic gastric ulcer without hemorrhage or perforation: Secondary | ICD-10-CM | POA: Diagnosis not present

## 2017-04-11 DIAGNOSIS — E2839 Other primary ovarian failure: Secondary | ICD-10-CM

## 2017-04-11 DIAGNOSIS — K311 Adult hypertrophic pyloric stenosis: Secondary | ICD-10-CM | POA: Diagnosis not present

## 2017-04-11 NOTE — Telephone Encounter (Signed)
Order fixed per request.

## 2017-04-11 NOTE — H&P (Signed)
Teresa Lozano 04/11/2017 11:27 AM Location: [facility] Patient #: [id number] DOB: 1950-09-04 Widowed / Language: Cleophus Molt / Race: White Female  History of Present Illness [name] MD; 04/11/2017 12:50 PM) The patient is a 66 year old female who presents with a gastric outlet obstruction. Note for "Gastric outlet obstruction": ` ` ` Patient sent for surgical consultation at the request of Dr. Montevallo Cellar, [facility]  Chief Complaint: Gastric Outlet Obstruction  The patient is a pleasant 66 year old female. She is to request stomach ulcer for some time. Usually at the pylorus. She struggle with chronic headaches and has depended on Excedrin usually. She is developed partial gastric outlet obstruction. His had prior endoscopies and dilations. However, she began to have worsening tolerance. Cannot tolerate any solid food. Depended on liquids. Unintentional weight loss. PPI medication was adjusted. Last endoscopy noted a pinhole opening. Not amenable to any more dilations. Biopsy consistent with ulcer disease. No cancer. He Helicobacter pylori negative. Gastroenterology recommended surgical consultation. She is been abstinent on nonsteroidals for over 3 months. Was felt that she was too strictured to tolerate dilations. Surgical consultation recommended. He went to [facility] due to insurance issues. She would like to stay in [place]. Therefore second opinion request for [facility], here.  Since she has stayed off the Excedrin, she fine she can tolerate mostly a full liquid diet. Mash potatoes. Sometimes chicken if she it her well. Not vomiting too much. Her weight is more stable and she actually regained a little bit weight. She is normal rather active. Used to hike 5 miles a day. A little less active now. She rarely drinks any alcohol. Maybe one or 2 daiquiri a year. Never smokes. No coffee. She's been compliant on increased omeprazole  proton pump inhibitor. She is frustrated that she cannot advance her diet and her food choices are extremely limited. She wishes to reconsider surgery.  No personal nor family history of GI/colon cancer, inflammatory bowel disease, irritable bowel syndrome, allergy such as Celiac Sprue, dietary/dairy problems, colitis, ulcers nor gastritis. No recent sick contacts/gastroenteritis. No travel outside the country. No changes in diet. No dysphagia to solids or liquids. No significant heartburn or reflux. No hematochezia, hematemesis, coffee ground emesis. No evidence of prior gastric/peptic ulceration.  (Review of systems as stated in this history (HPI) or in the review of systems. Otherwise all other 12 point ROS are negative)   Past Surgical History Illene Regulus, CMA; 04/11/2017 11:28 AM) Spinal Surgery - Neck Tonsillectomy  Diagnostic Studies History Lars Mage Spillers, CMA; 04/11/2017 11:28 AM) Colonoscopy >10 years ago Mammogram >3 years ago Pap Smear >5 years ago  Allergies Illene Regulus, CMA; 04/11/2017 11:30 AM) Lisinopril *CHEMICALS*  Medication History (Alisha Spillers, CMA; 04/11/2017 11:32 AM) AmLODIPine Besylate (10MG  Tablet, Oral) Active. Omeprazole (40MG  Capsule DR, Oral) Active. Tylenol 8 Hour (650MG  Tablet ER, Oral) Active. ZyrTEC Allergy (10MG  Tablet, Oral) Active. Prevacid (30MG  Capsule DR, Oral) Active. Magnesium (500MG  Tablet, Oral) Active. Zofran (4MG  Tablet, Oral) Active. Medications Reconciled  Social History Illene Regulus, CMA; 04/11/2017 11:28 AM) No alcohol use No caffeine use No drug use Tobacco use Former smoker.  Family History Illene Regulus, [place]; 04/11/2017 11:28 AM) Arthritis Father. Heart Disease Brother, Father. Hypertension Father, Mother, Sister. Kidney Disease Father. Migraine Headache Mother. Prostate Cancer Father.  Pregnancy / Birth History Illene Regulus, CMA; 04/11/2017 11:28 AM) Age at menarche  15 years. Age of menopause <46 Gravida 1 Maternal age 80-30 Para 1  Other Problems Illene Regulus, [place]; 04/11/2017 11:28  AM) Gastric Ulcer Gastroesophageal Reflux Disease Hemorrhoids High blood pressure Hypercholesterolemia Kidney Stone Migraine Headache     Review of Systems (Alisha Spillers CMA; 04/11/2017 11:28 AM) General Not Present- Appetite Loss, Chills, Fatigue, Fever, Night Sweats, Weight Gain and Weight Loss. Skin Not Present- Change in Wart/Mole, Dryness, Hives, Jaundice, New Lesions, Non-Healing Wounds, Rash and Ulcer. HEENT Present- Wears glasses/contact lenses. Not Present- Earache, Hearing Loss, Hoarseness, Nose Bleed, Oral Ulcers, Ringing in the Ears, Seasonal Allergies, Sinus Pain, Sore Throat, Visual Disturbances and Yellow Eyes. Respiratory Not Present- Bloody sputum, Chronic Cough, Difficulty Breathing, Snoring and Wheezing. Breast Not Present- Breast Mass, Breast Pain, Nipple Discharge and Skin Changes. Cardiovascular Present- Swelling of Extremities. Not Present- Chest Pain, Difficulty Breathing Lying Down, Leg Cramps, Palpitations, Rapid Heart Rate and Shortness of Breath. Gastrointestinal Present- Abdominal Pain, Excessive gas and Nausea. Not Present- Bloating, Bloody Stool, Change in Bowel Habits, Chronic diarrhea, Constipation, Difficulty Swallowing, Gets full quickly at meals, Hemorrhoids, Indigestion, Rectal Pain and Vomiting. Female Genitourinary Not Present- Frequency, Nocturia, Painful Urination, Pelvic Pain and Urgency. Musculoskeletal Present- Joint Pain and Swelling of Extremities. Not Present- Back Pain, Joint Stiffness, Muscle Pain and Muscle Weakness. Neurological Present- Headaches. Not Present- Decreased Memory, Fainting, Numbness, Seizures, Tingling, Tremor, Trouble walking and Weakness. Psychiatric Not Present- Anxiety, Bipolar, Change in Sleep Pattern, Depression, Fearful and Frequent crying. Endocrine Not Present- Cold Intolerance,  Excessive Hunger, Hair Changes, Heat Intolerance, Hot flashes and New Diabetes. Hematology Not Present- Blood Thinners, Easy Bruising, Excessive bleeding, Gland problems, HIV and Persistent Infections.  Vitals (Alisha Spillers CMA; 04/11/2017 11:30 AM) 04/11/2017 11:30 AM Weight: 122 lb Height: 61.5in Body Surface Area: 1.54 m Body Mass Index: 22.68 kg/m  Pulse: 74 (Regular)  BP: 142/78 (Sitting, Left Arm, Standard)      Assessment & Plan [name] MD; 04/11/2017 12:14 PM)  PARTIAL GASTRIC OUTLET OBSTRUCTION (K31.1) Impression: Chronic pyloric stricture assumed to ulcer disease due to heavy NSAID use.  She's regained some weight and is feeling a little bit better avoiding her Excedrin for severe headaches. These remained abstinent off nonsteroidals for the past 3 months.  However, she cannot advance beyond mashed potatoes and is really on a full liquid diet. She is frustrated and wishes to try and have a shot at surgery since she can have a better diet options and be less vulnerable to episodes of obstruction and nausea vomiting. Her son agrees.  Discussed at length with patient and my partners, Dr. Brantley Stage and Dr. Ninfa Linden. She is rather young and active, making the likelihood of worsening stricture imminent. She's done things to maximize nonoperative control with risk avoidance. On acid suppression but still struggles. We'll plan truncal vagotomy with antrectomy. Try and resect the ulcer as well. Billroth II reconstruction. Most likely would put in a feeding gastrojejunostomy to allow some jejunal feeds and gastric decompression. Excellent candidate for a minimally invasive approach. Robotic. She wishes to proceed.  Agree with her discussed with her primary care physician perhaps neurologist for workup her headaches and find a way to help control them so that she doesn't return to NSAIDs and risk stricturing her gastrojejunostomy.  PYLORIC ULCER, CHRONIC (K25.7)  Current  Plans You are being scheduled for surgery- Our schedulers will call you.  You should hear from our office's scheduling department within 5 working days about the location, date, and time of surgery. We try to make accommodations for patient's preferences in scheduling surgery, but sometimes the OR schedule or the surgeon's schedule prevents [place] from making  those accommodations.  If you have not heard from our office [phone]) in 5 working days, call the office and ask for your surgeon's nurse.  If you have other questions about your diagnosis, plan, or surgery, call the office and ask for your surgeon's nurse.  The anatomy & physiology of the foregut and digestive tract was discussed. The gastric pathophysiology was discussed. Natural history risks without surgery was discussed. The patient's situation is not adequately controlled by medicines and other non-operative treatments. I feel the risks of no intervention will lead to serious problems that outweigh the operative risks; therefore, I recommended surgery to resect part of the stomach. Need for a thorough workup to rule out the differential diagnosis and plan treatment was explained. I explained minimally invasive techniques with possible need for an open approach.  Risks such as bleeding, infection, abscess, leak, need for further treatment, heart attack, death, and other risks were discussed. I noted a good likelihood this will help address the problem. Goals of post-operative recovery were discussed as well. Possibility that this will not correct all symptoms was explained. Post-operative dysphagia, need for short-term liquid & pureed diet, inability to vomit, possibility of reherniation, possible need for medicines to help control symptoms in addition to surgery were discussed. Possible need for a feeding tube was discussed. Prolonged hospital stay with gastric ileus risks discussed. We will work to minimize complications.  Educational handouts further explaining the pathology, treatment options, and dysphagia diet was given as well. Questions were answered. The patient expresses understanding & wishes to proceed with surgery.  Pt Education - [facility] HCI Pt Education - Peptic Ulcer: discussed with patient and provided information.  [name], M.D., F.A.C.S. Gastrointestinal and Minimally Invasive Surgery [facility] [address], [place], [place] [address] [phone] Main / Paging

## 2017-04-11 NOTE — Telephone Encounter (Signed)
Teresa Lozano,[facility],called about the order for patient's bone density.  They can't use the diagnosis given.  You can use Osteopenia or Estrogen Deficient.  Please change in Epic. Patient doesn't have appointment scheduled,yet.  No need to call Clarise Cruz back unless you have a question. If you do call her, use option 1 and then option 2.

## 2017-04-11 NOTE — Addendum Note (Signed)
Addended by: [name] on: 04/11/2017 09:46 AM   Modules accepted: Orders

## 2017-04-18 HISTORY — PX: STOMACH SURGERY: SHX791

## 2017-04-20 ENCOUNTER — Ambulatory Visit
Admission: RE | Admit: 2017-04-20 | Discharge: 2017-04-20 | Disposition: A | Discharge status: Home / Self Care | Payer: Medicare Other | Source: Ambulatory Visit | Attending: Internal Medicine | Admitting: Internal Medicine

## 2017-04-20 DIAGNOSIS — Z780 Asymptomatic menopausal state: Secondary | ICD-10-CM | POA: Diagnosis not present

## 2017-04-20 DIAGNOSIS — M810 Age-related osteoporosis without current pathological fracture: Secondary | ICD-10-CM | POA: Diagnosis not present

## 2017-04-20 DIAGNOSIS — E2839 Other primary ovarian failure: Secondary | ICD-10-CM

## 2017-04-21 ENCOUNTER — Telehealth: Payer: Self-pay | Admitting: Internal Medicine

## 2017-04-21 ENCOUNTER — Telehealth: Payer: Self-pay

## 2017-04-21 DIAGNOSIS — R1013 Epigastric pain: Secondary | ICD-10-CM

## 2017-04-21 MED ORDER — ONDANSETRON HCL 4 MG PO TABS: 4.0000 mg | ORAL_TABLET | Freq: Three times a day (TID) | ORAL | 0 refills | Status: DC | PRN

## 2017-04-21 NOTE — Telephone Encounter (Signed)
zofran sent to [facility]

## 2017-04-21 NOTE — Telephone Encounter (Signed)
dexa reordered with Dx of estrogen deficiency

## 2017-04-21 NOTE — Telephone Encounter (Signed)
Caller Name:sara  Relationship to Patient:[facility]  Best number:[phone] press 1, then 2  Pharmacy:  Reason for call: need dx code to : not able to use screening for diagnosis, can use estrogen deficiency, osteopenia, ect.Marland Kitchen

## 2017-04-21 NOTE — Telephone Encounter (Signed)
Call back and have them use Estrogen Deficiency

## 2017-04-21 NOTE — Telephone Encounter (Signed)
Pt left v/m pt has had a h/a for 3 days; pt took last ondansetron this morning;pt request med for h/a and nausea to [facility]. Pt has CPX scheduled on 04/26/17. Last acute 04/07/17.Please advise.

## 2017-04-21 NOTE — Addendum Note (Signed)
Addended by: Lurlean Nanny on: 04/21/2017 03:50 PM   Modules accepted: Orders

## 2017-04-21 NOTE — Addendum Note (Signed)
Addended by: [name] on: 04/21/2017 12:30 PM   Modules accepted: Orders

## 2017-04-26 ENCOUNTER — Other Ambulatory Visit (HOSPITAL_COMMUNITY)
Admission: RE | Admit: 2017-04-26 | Discharge: 2017-04-26 | Disposition: A | Discharge status: Home / Self Care | Payer: Medicare Other | Source: Ambulatory Visit | Attending: Internal Medicine | Admitting: Internal Medicine

## 2017-04-26 ENCOUNTER — Encounter: Payer: Self-pay | Admitting: Internal Medicine

## 2017-04-26 ENCOUNTER — Ambulatory Visit (INDEPENDENT_AMBULATORY_CARE_PROVIDER_SITE_OTHER): Payer: Medicare Other | Admitting: Internal Medicine

## 2017-04-26 VITALS — BP 122/62 | HR 91 | Temp 97.8°F | Ht 62.0 in | Wt 120.2 lb

## 2017-04-26 DIAGNOSIS — Z1159 Encounter for screening for other viral diseases: Secondary | ICD-10-CM

## 2017-04-26 DIAGNOSIS — M810 Age-related osteoporosis without current pathological fracture: Secondary | ICD-10-CM | POA: Diagnosis not present

## 2017-04-26 DIAGNOSIS — Z124 Encounter for screening for malignant neoplasm of cervix: Secondary | ICD-10-CM | POA: Diagnosis not present

## 2017-04-26 DIAGNOSIS — Z114 Encounter for screening for human immunodeficiency virus [HIV]: Secondary | ICD-10-CM

## 2017-04-26 DIAGNOSIS — R519 Headache, unspecified: Secondary | ICD-10-CM

## 2017-04-26 DIAGNOSIS — Z01419 Encounter for gynecological examination (general) (routine) without abnormal findings: Secondary | ICD-10-CM | POA: Insufficient documentation

## 2017-04-26 DIAGNOSIS — Z0000 Encounter for general adult medical examination without abnormal findings: Secondary | ICD-10-CM | POA: Diagnosis not present

## 2017-04-26 DIAGNOSIS — R51 Headache: Principal | ICD-10-CM

## 2017-04-26 DIAGNOSIS — Z23 Encounter for immunization: Secondary | ICD-10-CM | POA: Diagnosis not present

## 2017-04-26 DIAGNOSIS — E7800 Pure hypercholesterolemia, unspecified: Secondary | ICD-10-CM | POA: Diagnosis not present

## 2017-04-26 LAB — COMPREHENSIVE METABOLIC PANEL
ALBUMIN: 4.4 g/dL (ref 3.5–5.2)
ALT: 57 U/L — AB (ref 0–35)
AST: 64 U/L — AB (ref 0–37)
Alkaline Phosphatase: 118 U/L — ABNORMAL HIGH (ref 39–117)
BUN: 19 mg/dL (ref 6–23)
CHLORIDE: 100 meq/L (ref 96–112)
CO2: 30 meq/L (ref 19–32)
Calcium: 10 mg/dL (ref 8.4–10.5)
Creatinine, Ser: 1.03 mg/dL (ref 0.40–1.20)
GFR: 57.15 mL/min — ABNORMAL LOW (ref >60.00)
Glucose, Bld: 103 mg/dL — ABNORMAL HIGH (ref 70–99)
Potassium: 4.5 mEq/L (ref 3.5–5.1)
SODIUM: 141 meq/L (ref 135–145)
Total Bilirubin: 0.4 mg/dL (ref 0.2–1.2)
Total Protein: 7.7 g/dL (ref 6.0–8.3)

## 2017-04-26 LAB — LIPID PANEL
CHOL/HDL RATIO: 5
Cholesterol: 255 mg/dL — ABNORMAL HIGH (ref 0–200)
HDL: 49.2 mg/dL (ref >39.00)
LDL Cholesterol: 170 mg/dL — ABNORMAL HIGH (ref 0–99)
NONHDL: 205.75
Triglycerides: 178 mg/dL — ABNORMAL HIGH (ref 0.0–149.0)
VLDL: 35.6 mg/dL (ref 0.0–40.0)

## 2017-04-26 LAB — CBC
HCT: 43.2 % (ref 36.0–46.0)
Hemoglobin: 14 g/dL (ref 12.0–15.0)
MCHC: 32.5 g/dL (ref 30.0–36.0)
MCV: 99.6 fl (ref 78.0–100.0)
Platelets: 308 10*3/uL (ref 150.0–400.0)
RBC: 4.34 Mil/uL (ref 3.87–5.11)
RDW: 13.5 % (ref 11.5–15.5)
WBC: 4.9 10*3/uL (ref 4.0–10.5)

## 2017-04-26 LAB — VITAMIN D 25 HYDROXY (VIT D DEFICIENCY, FRACTURES): VITD: 33.37 ng/mL (ref 30.00–100.00)

## 2017-04-26 NOTE — Assessment & Plan Note (Signed)
She is interested in trying Reclast Encouraged her to get 30 minutes of weight bearing exercise daily Will check Vit D today so that I can recommend a dose of Calcium and Vit D supplementation

## 2017-04-26 NOTE — Progress Notes (Signed)
HPI:  Pt presents to the clinic today for her Welcome to Medicare Exam.  She does reports myalgias with the Simvastatin. She held it for 2 weeks, the myalgias improved. She tried to restart it, but reports the myalgias returned, so she stopped it. She in interested in trying another statin. She reports she has not tried any other cholesterol lowering medications in the past.  Past Medical History:  Diagnosis Date  . Allergy   . Arthritis   . Chicken pox   . Frequent headaches   . GERD (gastroesophageal reflux disease)   . History of kidney stones   . Hyperlipidemia   . Hypertension   . Osteoporosis     Current Outpatient Prescriptions  Medication Sig Dispense Refill  . acetaminophen (TYLENOL) 650 MG CR tablet Take 1,300 mg by mouth 2 (two) times daily.    Marland Kitchen amLODipine (NORVASC) 10 MG tablet Take 1 tablet (10 mg total) by mouth daily. 30 tablet 11  . cetirizine (ZYRTEC) 10 MG tablet Take 10 mg by mouth daily.    . lansoprazole (PREVACID SOLUTAB) 30 MG disintegrating tablet Take 1 tablet (30 mg total) by mouth 2 (two) times daily. 90 tablet 1  . Magnesium 500 MG CAPS Take 500 mg by mouth 2 (two) times a week.     Marland Kitchen omeprazole (PRILOSEC) 40 MG capsule Take 40 mg by mouth 2 (two) times daily.     . ondansetron (ZOFRAN) 4 MG tablet Take 1 tablet (4 mg total) by mouth every 8 (eight) hours as needed. (Patient taking differently: Take 4 mg by mouth every 8 (eight) hours as needed for nausea or vomiting. ) 20 tablet 0  . simvastatin (ZOCOR) 10 MG tablet TAKE 1 TABLET (10 MG TOTAL) BY MOUTH DAILY. (Patient not taking: Reported on 04/26/2017) 30 tablet 1   No current facility-administered medications for this visit.     Allergies  Allergen Reactions  . Lisinopril Cough    Family History  Problem Relation Age of Onset  . Hyperlipidemia Mother   . Hypertension Mother   . Prostate cancer Father   . Hyperlipidemia Father   . Heart disease Father   . Heart disease Brother   .  Hypertension Sister   . Hypertension Maternal Grandmother   . Colon cancer Neg Hx     Social History   Social History  . Marital status: Widowed    Spouse name: N/A  . Number of children: N/A  . Years of education: N/A   Occupational History  . Not on file.   Social History Main Topics  . Smoking status: Never Smoker  . Smokeless tobacco: Never Used  . Alcohol use No  . Drug use: No  . Sexual activity: No   Other Topics Concern  . Not on file   Social History Narrative  . No narrative on file    Hospitiliaztions: None  Health Maintenance:    Flu: 2015  Tetanus: 2011  Pneumovax: never  Prevnar: never  Zostavax: never  Mammogram: 03/2017  Pap Smear: 2011  Bone Density: 03/2017, wants to discuss results and treatment options today  Colon Screening: never  Eye Doctor: annually  Dental Exam: as needed   Providers:   PCP: Webb Silversmith, Np-C  General Surgery: Dr. Johney Maine  Gastroenterologist: Dr. Havery Moros     I have personally reviewed and have noted:  1. The patient's medical and social history 2. Their use of alcohol, tobacco or illicit drugs 3. Their current medications and supplements 4.  The patient's functional ability including ADL's, fall risks, home safety risks and hearing or visual impairment. 5. Diet and physical activities 6. Evidence for depression or mood disorder  Subjective:   Review of Systems:   Constitutional: Pt reports intermittent headaches. Denies fever, malaise, fatigue, headache or abrupt weight changes.  HEENT: Denies eye pain, eye redness, ear pain, ringing in the ears, wax buildup, runny nose, nasal congestion, bloody nose, or sore throat. Respiratory: Denies difficulty breathing, shortness of breath, cough or sputum production.   Cardiovascular: Denies chest pain, chest tightness, palpitations or swelling in the hands or feet.  Gastrointestinal: Pt reports abdominal pain (known gastric ulcer). Denies abdominal pain, bloating,  constipation, diarrhea or blood in the stool.  GU: Denies urgency, frequency, pain with urination, burning sensation, blood in urine, odor or discharge. Musculoskeletal: Pt reports bilateral foot pain. Denies decrease in range of motion, difficulty with gait, muscle pain or joint swelling.  Skin: Denies redness, rashes, lesions or ulcercations.  Neurological: Denies dizziness, difficulty with memory, difficulty with speech or problems with balance and coordination.  Psych: Denies anxiety, depression, SI/HI.  No other specific complaints in a complete review of systems (except as listed in HPI above).  Objective:  PE:   BP 122/62   Pulse 91   Temp 97.8 F (36.6 C) (Oral)   Ht '5\' 2"'$  (1.575 m)   Wt 120 lb 4 oz (54.5 kg)   SpO2 98%   BMI 21.99 kg/m  Wt Readings from Last 3 Encounters:  04/26/17 120 lb 4 oz (54.5 kg)  04/07/17 120 lb (54.4 kg)  03/24/17 118 lb (53.5 kg)    General: Appears her  stated age, in NAD. Skin: Warm, dry and intact.  HEENT: Head: normal shape and size; Eyes: sclera white, no icterus, conjunctiva pink, PERRLA and EOMs intact; Ears: Bilateral cerumen impaction; Throat/Mouth: Teeth present, mucosa pink and moist, no exudate, lesions or ulcerations noted.  Neck: Neck supple, trachea midline. No masses, lumps or thyromegaly present.  Cardiovascular: Normal rate and rhythm. S1,S2 noted.  No murmur, rubs or gallops noted. No JVD or BLE edema. No carotid bruits noted. Pulmonary/Chest: Normal effort and positive vesicular breath sounds. No respiratory distress. No wheezes, rales or ronchi noted.  Abdomen: Soft and mildly tender in the epigastric region. Normal bowel sounds. No distention or masses noted. Liver, spleen and kidneys non palpable. Pelvic: Normal female anatomy. Cervix friable. Adnexa palpable on the right. Musculoskeletal:  Strength 5/5 BUE/BLE. No signs of joint swelling.  Neurological: Alert and oriented. Cranial nerves II-XII grossly intact.  Coordination normal.  Psychiatric: Mood and affect normal. Behavior is normal. Judgment and thought content normal.    BMET    Component Value Date/Time   NA 143 10/21/2016 1025   K 4.2 10/21/2016 1025   CL 103 10/21/2016 1025   CO2 32 10/21/2016 1025   GLUCOSE 106 (H) 10/21/2016 1025   BUN 17 10/21/2016 1025   CREATININE 1.20 10/21/2016 1025   CALCIUM 10.0 10/21/2016 1025   GFRNONAA 49 (L) 12/17/2009 0855   GFRAA (L) 12/17/2009 0855    59        The eGFR has been calculated using the MDRD equation. This calculation has not been validated in all clinical situations. eGFR's persistently <60 mL/min signify possible Chronic Kidney Disease.    Lipid Panel     Component Value Date/Time   CHOL 176 03/02/2016 1006   TRIG 128.0 03/02/2016 1006   HDL 49.50 03/02/2016 1006   CHOLHDL 4 03/02/2016  1006   VLDL 25.6 03/02/2016 1006   LDLCALC 101 (H) 03/02/2016 1006    CBC    Component Value Date/Time   WBC 13.2 (H) 10/21/2016 1025   RBC 4.49 10/21/2016 1025   HGB 14.1 10/21/2016 1025   HCT 43.1 10/21/2016 1025   PLT 276.0 10/21/2016 1025   MCV 95.9 10/21/2016 1025   MCHC 32.7 10/21/2016 1025   RDW 13.1 10/21/2016 1025   LYMPHSABS 2.3 12/17/2009 0855   MONOABS 0.7 12/17/2009 0855   EOSABS 0.2 12/17/2009 0855   BASOSABS 0.0 12/17/2009 0855    Hgb A1C No results found for: HGBA1C    Assessment and Plan:   Medicare Annual Wellness Visit:  Diet: She does eat meat. She has not been consuming a lot of fruits secondary to her gastric ulcer. She does consume veggies. She has been avoiding fried foods. She drinks mostly water. Physical activity: None Depression/mood screen: Negative Hearing: Intact to whispered voice Visual acuity: Grossly normal, performs annual eye exam  ADLs: Capable Fall risk: None Home safety: Good Cognitive evaluation: Intact to orientation, naming, recall and repetition EOL planning: No adv directives, full code/ I agree  Preventative  Medicine: Flu and prevnar today. Tetanus UTD per her report. Will get pneumovax in 1 year. She will call insurance about Shingrix. Mammogram and bone density UTD. Pap smear today. She wants to hold off on colon cancer screening at this time, will consider it after her surgery next week for a gastric ulcer. Encouraged her to consume a balanced diet and exercise regimen. Advised her to see an eye doctor and dentist annually. Will check CBC, CMET, Lipid, Vit D, HIV and Hep C today.   Next appointment: 3 month, lab only CMET and Lipid   BAITY, REGINA, NP

## 2017-04-26 NOTE — Patient Instructions (Addendum)
Health Maintenance for Postmenopausal Women Menopause is a normal process in which your reproductive ability comes to an end. This process happens gradually over a span of months to years, usually between the ages of 31 and 13. Menopause is complete when you have missed 12 consecutive menstrual periods. It is important to talk with your health care provider about some of the most common conditions that affect postmenopausal women, such as heart disease, cancer, and bone loss (osteoporosis). Adopting a healthy lifestyle and getting preventive care can help to promote your health and wellness. Those actions can also lower your chances of developing some of these common conditions. What should I know about menopause? During menopause, you may experience a number of symptoms, such as:  Moderate-to-severe hot flashes.  Night sweats.  Decrease in sex drive.  Mood swings.  Headaches.  Tiredness.  Irritability.  Memory problems.  Insomnia.  Choosing to treat or not to treat menopausal changes is an individual decision that you make with your health care provider. What should I know about hormone replacement therapy and supplements? Hormone therapy products are effective for treating symptoms that are associated with menopause, such as hot flashes and night sweats. Hormone replacement carries certain risks, especially as you become older. If you are thinking about using estrogen or estrogen with progestin treatments, discuss the benefits and risks with your health care provider. What should I know about heart disease and stroke? Heart disease, heart attack, and stroke become more likely as you age. This may be due, in part, to the hormonal changes that your body experiences during menopause. These can affect how your body processes dietary fats, triglycerides, and cholesterol. Heart attack and stroke are both medical emergencies. There are many things that you can do to help prevent heart  disease and stroke:  Have your blood pressure checked at least every 1-2 years. High blood pressure causes heart disease and increases the risk of stroke.  If you are 14-13 years old, ask your health care provider if you should take aspirin to prevent a heart attack or a stroke.  Do not use any tobacco products, including cigarettes, chewing tobacco, or electronic cigarettes. If you need help quitting, ask your health care provider.  It is important to eat a healthy diet and maintain a healthy weight. ? Be sure to include plenty of vegetables, fruits, low-fat dairy products, and lean protein. ? Avoid eating foods that are high in solid fats, added sugars, or salt (sodium).  Get regular exercise. This is one of the most important things that you can do for your health. ? Try to exercise for at least 150 minutes each week. The type of exercise that you do should increase your heart rate and make you sweat. This is known as moderate-intensity exercise. ? Try to do strengthening exercises at least twice each week. Do these in addition to the moderate-intensity exercise.  Know your numbers.Ask your health care provider to check your cholesterol and your blood glucose. Continue to have your blood tested as directed by your health care provider.  What should I know about cancer screening? There are several types of cancer. Take the following steps to reduce your risk and to catch any cancer development as early as possible. Breast Cancer  Practice breast self-awareness. ? This means understanding how your breasts normally appear and feel. ? It also means doing regular breast self-exams. Let your health care provider know about any changes, no matter how small.  If you are 40  or older, have a clinician do a breast exam (clinical breast exam or CBE) every year. Depending on your age, family history, and medical history, it may be recommended that you also have a yearly breast X-ray  (mammogram).  If you have a family history of breast cancer, talk with your health care provider about genetic screening.  If you are at high risk for breast cancer, talk with your health care provider about having an MRI and a mammogram every year.  Breast cancer (BRCA) gene test is recommended for women who have family members with BRCA-related cancers. Results of the assessment will determine the need for genetic counseling and BRCA1 and for BRCA2 testing. BRCA-related cancers include these types: ? Breast. This occurs in males or females. ? Ovarian. ? Tubal. This may also be called fallopian tube cancer. ? Cancer of the abdominal or pelvic lining (peritoneal cancer). ? Prostate. ? Pancreatic.  Cervical, Uterine, and Ovarian Cancer Your health care provider may recommend that you be screened regularly for cancer of the pelvic organs. These include your ovaries, uterus, and vagina. This screening involves a pelvic exam, which includes checking for microscopic changes to the surface of your cervix (Pap test).  For women ages 21-65, health care providers may recommend a pelvic exam and a Pap test every three years. For women ages 72-65, they may recommend the Pap test and pelvic exam, combined with testing for human papilloma virus (HPV), every five years. Some types of HPV increase your risk of cervical cancer. Testing for HPV may also be done on women of any age who have unclear Pap test results.  Other health care providers may not recommend any screening for nonpregnant women who are considered low risk for pelvic cancer and have no symptoms. Ask your health care provider if a screening pelvic exam is right for you.  If you have had past treatment for cervical cancer or a condition that could lead to cancer, you need Pap tests and screening for cancer for at least 20 years after your treatment. If Pap tests have been discontinued for you, your risk factors (such as having a new sexual  partner) need to be reassessed to determine if you should start having screenings again. Some women have medical problems that increase the chance of getting cervical cancer. In these cases, your health care provider may recommend that you have screening and Pap tests more often.  If you have a family history of uterine cancer or ovarian cancer, talk with your health care provider about genetic screening.  If you have vaginal bleeding after reaching menopause, tell your health care provider.  There are currently no reliable tests available to screen for ovarian cancer.  Lung Cancer Lung cancer screening is recommended for adults 65-82 years old who are at high risk for lung cancer because of a history of smoking. A yearly low-dose CT scan of the lungs is recommended if you:  Currently smoke.  Have a history of at least 30 pack-years of smoking and you currently smoke or have quit within the past 15 years. A pack-year is smoking an average of one pack of cigarettes per day for one year.  Yearly screening should:  Continue until it has been 15 years since you quit.  Stop if you develop a health problem that would prevent you from having lung cancer treatment.  Colorectal Cancer  This type of cancer can be detected and can often be prevented.  Routine colorectal cancer screening usually begins at  age 30 and continues through age 22.  If you have risk factors for colon cancer, your health care provider may recommend that you be screened at an earlier age.  If you have a family history of colorectal cancer, talk with your health care provider about genetic screening.  Your health care provider may also recommend using home test kits to check for hidden blood in your stool.  A small camera at the end of a tube can be used to examine your colon directly (sigmoidoscopy or colonoscopy). This is done to check for the earliest forms of colorectal cancer.  Direct examination of the colon  should be repeated every 5-10 years until age 28. However, if early forms of precancerous polyps or small growths are found or if you have a family history or genetic risk for colorectal cancer, you may need to be screened more often.  Skin Cancer  Check your skin from head to toe regularly.  Monitor any moles. Be sure to tell your health care provider: ? About any new moles or changes in moles, especially if there is a change in a mole's shape or color. ? If you have a mole that is larger than the size of a pencil eraser.  If any of your family members has a history of skin cancer, especially at a young age, talk with your health care provider about genetic screening.  Always use sunscreen. Apply sunscreen liberally and repeatedly throughout the day.  Whenever you are outside, protect yourself by wearing long sleeves, pants, a wide-brimmed hat, and sunglasses.  What should I know about osteoporosis? Osteoporosis is a condition in which bone destruction happens more quickly than new bone creation. After menopause, you may be at an increased risk for osteoporosis. To help prevent osteoporosis or the bone fractures that can happen because of osteoporosis, the following is recommended:  If you are 62-69 years old, get at least 1,000 mg of calcium and at least 600 mg of vitamin D per day.  If you are older than age 60 but younger than age 68, get at least 1,200 mg of calcium and at least 600 mg of vitamin D per day.  If you are older than age 35, get at least 1,200 mg of calcium and at least 800 mg of vitamin D per day.  Smoking and excessive alcohol intake increase the risk of osteoporosis. Eat foods that are rich in calcium and vitamin D, and do weight-bearing exercises several times each week as directed by your health care provider. What should I know about how menopause affects my mental health? Depression may occur at any age, but it is more common as you become older. Common symptoms of  depression include:  Low or sad mood.  Changes in sleep patterns.  Changes in appetite or eating patterns.  Feeling an overall lack of motivation or enjoyment of activities that you previously enjoyed.  Frequent crying spells.  Talk with your health care provider if you think that you are experiencing depression. What should I know about immunizations? It is important that you get and maintain your immunizations. These include:  Tetanus, diphtheria, and pertussis (Tdap) booster vaccine.  Influenza every year before the flu season begins.  Pneumonia vaccine.  Shingles vaccine.  Your health care provider may also recommend other immunizations. This information is not intended to replace advice given to you by your health care provider. Make sure you discuss any questions you have with your health care provider. Document Released: 08/27/2005  Document Revised: 01/23/2016 Document Reviewed: 04/08/2015 Elsevier Interactive Patient Education  2018 [employer]  

## 2017-04-26 NOTE — Assessment & Plan Note (Signed)
CMET and lipid profile today If LDL > 130, consider Atorvastatin Encouraged her to consume a low fat diet

## 2017-04-27 ENCOUNTER — Other Ambulatory Visit: Payer: Self-pay | Admitting: Internal Medicine

## 2017-04-27 DIAGNOSIS — E7800 Pure hypercholesterolemia, unspecified: Secondary | ICD-10-CM

## 2017-04-27 LAB — HEPATITIS C ANTIBODY
HEP C AB: NONREACTIVE
SIGNAL TO CUT-OFF: 0.01 (ref <1.00)

## 2017-04-27 LAB — HIV ANTIBODY (ROUTINE TESTING W REFLEX): HIV: NONREACTIVE

## 2017-04-27 MED ORDER — ATORVASTATIN CALCIUM 20 MG PO TABS: 20.0000 mg | ORAL_TABLET | Freq: Every day | ORAL | 2 refills | Status: DC

## 2017-04-27 NOTE — Patient Instructions (Signed)
Teresa Lozano  04/27/2017   Your procedure is scheduled on: 05-04-17  Report to [facility] Main  Entrance Take [place]  elevators to 3rd floor to  [place] at [phone].   Call this number if you have problems the morning of surgery [phone]    Remember: ONLY 1 PERSON MAY GO WITH YOU TO SHORT STAY TO GET  READY MORNING OF Teresa Lozano.  Do not eat food or drink liquids :After Midnight.     Take these medicines the morning of surgery with A SIP OF WATER: tylenol if needed, amlodipine(norvasc), cetirizine(zyrtec), lansoprazole(prevacid), omeprazole(prilosec),                                 You may not have any metal on your body including hair pins and              piercings  Do not wear jewelry, make-up, lotions, powders or perfumes, deodorant             Do not wear nail polish.  Do not shave  48 hours prior to surgery.             Do not bring valuables to the hospital. [place].  Contacts, dentures or bridgework may not be worn into surgery.  Leave suitcase in the car. After surgery it may be brought to your room.                Please read over the following fact sheets you were given: _____________________________________________________________________           [facility] - Preparing for Surgery Before surgery, you can play an important role.  Because skin is not sterile, your skin needs to be as free of germs as possible.  You can reduce the number of germs on your skin by washing with CHG (chlorahexidine gluconate) soap before surgery.  CHG is an antiseptic cleaner which kills germs and bonds with the skin to continue killing germs even after washing. Please DO NOT use if you have an allergy to CHG or antibacterial soaps.  If your skin becomes reddened/irritated stop using the CHG and inform your nurse when you arrive at Short Stay. Do not shave (including legs and underarms) for at  least 48 hours prior to the first CHG shower.  You may shave your face/neck. Please follow these instructions carefully:  1.  Shower with CHG Soap the night before surgery and the  morning of Surgery.  2.  If you choose to wash your hair, wash your hair first as usual with your  normal  shampoo.  3.  After you shampoo, rinse your hair and body thoroughly to remove the  shampoo.                           4.  Use CHG as you would any other liquid soap.  You can apply chg directly  to the skin and wash                       Gently with a scrungie or clean washcloth.  5.  Apply the CHG Soap to your body ONLY FROM  THE NECK DOWN.   Do not use on face/ open                           Wound or open sores. Avoid contact with eyes, ears mouth and genitals (private parts).                       Wash face,  Genitals (private parts) with your normal soap.             6.  Wash thoroughly, paying special attention to the area where your surgery  will be performed.  7.  Thoroughly rinse your body with warm water from the neck down.  8.  DO NOT shower/wash with your normal soap after using and rinsing off  the CHG Soap.                9.  Pat yourself dry with a clean towel.            10.  Wear clean pajamas.            11.  Place clean sheets on your bed the night of your first shower and do not  sleep with pets. Day of Surgery : Do not apply any lotions/deodorants the morning of surgery.  Please wear clean clothes to the hospital/surgery center.  FAILURE TO FOLLOW THESE INSTRUCTIONS MAY RESULT IN THE CANCELLATION OF YOUR SURGERY PATIENT SIGNATURE_________________________________  NURSE SIGNATURE__________________________________  ________________________________________________________________________

## 2017-04-27 NOTE — Progress Notes (Signed)
EKG 04-26-17 epic  CBC, CMP 04-26-17 epic

## 2017-04-28 ENCOUNTER — Encounter (HOSPITAL_COMMUNITY)
Admission: RE | Admit: 2017-04-28 | Discharge: 2017-04-28 | Disposition: A | Discharge status: Home / Self Care | Payer: Medicare Other | Source: Ambulatory Visit | Attending: Surgery | Admitting: Surgery

## 2017-04-28 ENCOUNTER — Encounter (HOSPITAL_COMMUNITY): Payer: Self-pay

## 2017-04-28 DIAGNOSIS — K257 Chronic gastric ulcer without hemorrhage or perforation: Secondary | ICD-10-CM | POA: Insufficient documentation

## 2017-04-28 DIAGNOSIS — K311 Adult hypertrophic pyloric stenosis: Secondary | ICD-10-CM | POA: Insufficient documentation

## 2017-04-28 DIAGNOSIS — Z01818 Encounter for other preprocedural examination: Secondary | ICD-10-CM | POA: Insufficient documentation

## 2017-04-28 LAB — CYTOLOGY - PAP
Diagnosis: NEGATIVE
HPV (WINDOPATH): NOT DETECTED

## 2017-05-04 ENCOUNTER — Inpatient Hospital Stay (HOSPITAL_COMMUNITY): Payer: Medicare Other | Admitting: Certified Registered Nurse Anesthetist

## 2017-05-04 ENCOUNTER — Encounter (HOSPITAL_COMMUNITY): Payer: Self-pay

## 2017-05-04 ENCOUNTER — Encounter (HOSPITAL_COMMUNITY)
Admission: RE | Disposition: A | Discharge status: Home / Self Care | Payer: Self-pay | Source: Ambulatory Visit | Attending: Surgery

## 2017-05-04 ENCOUNTER — Inpatient Hospital Stay (HOSPITAL_COMMUNITY)
Admission: RE | Admit: 2017-05-04 | Discharge: 2017-05-08 | DRG: 328 | Disposition: A | Discharge status: Home / Self Care | Payer: Medicare Other | Source: Ambulatory Visit | Attending: Surgery | Admitting: Surgery

## 2017-05-04 DIAGNOSIS — K257 Chronic gastric ulcer without hemorrhage or perforation: Secondary | ICD-10-CM | POA: Diagnosis present

## 2017-05-04 DIAGNOSIS — K311 Adult hypertrophic pyloric stenosis: Principal | ICD-10-CM | POA: Diagnosis present

## 2017-05-04 DIAGNOSIS — E7800 Pure hypercholesterolemia, unspecified: Secondary | ICD-10-CM | POA: Diagnosis present

## 2017-05-04 DIAGNOSIS — E785 Hyperlipidemia, unspecified: Secondary | ICD-10-CM | POA: Diagnosis not present

## 2017-05-04 DIAGNOSIS — Z6822 Body mass index (BMI) 22.0-22.9, adult: Secondary | ICD-10-CM

## 2017-05-04 DIAGNOSIS — K219 Gastro-esophageal reflux disease without esophagitis: Secondary | ICD-10-CM | POA: Diagnosis present

## 2017-05-04 DIAGNOSIS — Z8249 Family history of ischemic heart disease and other diseases of the circulatory system: Secondary | ICD-10-CM | POA: Diagnosis not present

## 2017-05-04 DIAGNOSIS — G43909 Migraine, unspecified, not intractable, without status migrainosus: Secondary | ICD-10-CM | POA: Diagnosis present

## 2017-05-04 DIAGNOSIS — Z9889 Other specified postprocedural states: Secondary | ICD-10-CM

## 2017-05-04 DIAGNOSIS — Z980 Intestinal bypass and anastomosis status: Secondary | ICD-10-CM

## 2017-05-04 DIAGNOSIS — K2950 Unspecified chronic gastritis without bleeding: Secondary | ICD-10-CM | POA: Diagnosis not present

## 2017-05-04 DIAGNOSIS — K267 Chronic duodenal ulcer without hemorrhage or perforation: Secondary | ICD-10-CM | POA: Diagnosis present

## 2017-05-04 DIAGNOSIS — R634 Abnormal weight loss: Secondary | ICD-10-CM | POA: Diagnosis present

## 2017-05-04 DIAGNOSIS — I10 Essential (primary) hypertension: Secondary | ICD-10-CM | POA: Diagnosis present

## 2017-05-04 DIAGNOSIS — T39395S Adverse effect of other nonsteroidal anti-inflammatory drugs [NSAID], sequela: Secondary | ICD-10-CM

## 2017-05-04 DIAGNOSIS — K649 Unspecified hemorrhoids: Secondary | ICD-10-CM | POA: Diagnosis present

## 2017-05-04 DIAGNOSIS — Z888 Allergy status to other drugs, medicaments and biological substances status: Secondary | ICD-10-CM | POA: Diagnosis not present

## 2017-05-04 SURGERY — FUNDOPLICATION, NISSEN, ROBOT-ASSISTED, LAPAROSCOPIC
Anesthesia: General

## 2017-05-04 MED ORDER — LIDOCAINE 2% (20 MG/ML) 5 ML SYRINGE
INTRAMUSCULAR | Status: DC | PRN
  Administered 2017-05-04: 1.5 mg/kg/h via INTRAVENOUS

## 2017-05-04 MED ORDER — HYDROMORPHONE HCL-NACL 0.5-0.9 MG/ML-% IV SOSY
0.2500 mg | PREFILLED_SYRINGE | INTRAVENOUS | Status: DC | PRN
  Administered 2017-05-04 (×4): 0.5 mg via INTRAVENOUS

## 2017-05-04 MED ORDER — SCOPOLAMINE 1 MG/3DAYS TD PT72
MEDICATED_PATCH | TRANSDERMAL | Status: DC | PRN
  Administered 2017-05-04: 1 via TRANSDERMAL

## 2017-05-04 MED ORDER — KETAMINE HCL-SODIUM CHLORIDE 100-0.9 MG/10ML-% IV SOSY
PREFILLED_SYRINGE | INTRAVENOUS | Status: AC
  Filled 2017-05-04: qty 10

## 2017-05-04 MED ORDER — OXYCODONE HCL 5 MG PO TABS: 5.0000 mg | ORAL_TABLET | ORAL | Status: DC | PRN

## 2017-05-04 MED ORDER — POLYETHYLENE GLYCOL 3350 17 G PO PACK: 17.0000 g | PACK | Freq: Every day | ORAL | Status: DC | PRN

## 2017-05-04 MED ORDER — SUGAMMADEX SODIUM 200 MG/2ML IV SOLN
INTRAVENOUS | Status: DC | PRN
  Administered 2017-05-04: 110 mg via INTRAVENOUS

## 2017-05-04 MED ORDER — SUGAMMADEX SODIUM 200 MG/2ML IV SOLN
INTRAVENOUS | Status: AC
  Filled 2017-05-04: qty 2

## 2017-05-04 MED ORDER — ZOLPIDEM TARTRATE 5 MG PO TABS
5.0000 mg | ORAL_TABLET | Freq: Every evening | ORAL | Status: DC | PRN
  Administered 2017-05-07: 5 mg via ORAL
  Filled 2017-05-04: qty 1

## 2017-05-04 MED ORDER — DEXAMETHASONE SODIUM PHOSPHATE 10 MG/ML IJ SOLN
INTRAMUSCULAR | Status: AC
  Filled 2017-05-04: qty 1

## 2017-05-04 MED ORDER — LIDOCAINE 2% (20 MG/ML) 5 ML SYRINGE
INTRAMUSCULAR | Status: AC
  Filled 2017-05-04: qty 5

## 2017-05-04 MED ORDER — METRONIDAZOLE IN NACL 5-0.79 MG/ML-% IV SOLN
500.0000 mg | INTRAVENOUS | Status: AC
  Administered 2017-05-04: 500 mg via INTRAVENOUS
  Filled 2017-05-04: qty 100

## 2017-05-04 MED ORDER — ONDANSETRON HCL 4 MG/2ML IJ SOLN: 4.0000 mg | Freq: Four times a day (QID) | INTRAMUSCULAR | Status: DC | PRN

## 2017-05-04 MED ORDER — SIMETHICONE 80 MG PO CHEW: 40.0000 mg | CHEWABLE_TABLET | Freq: Four times a day (QID) | ORAL | Status: DC | PRN

## 2017-05-04 MED ORDER — BUPIVACAINE-EPINEPHRINE 0.25% -1:200000 IJ SOLN
INTRAMUSCULAR | Status: DC | PRN
  Administered 2017-05-04: 50 mL

## 2017-05-04 MED ORDER — DEXAMETHASONE SODIUM PHOSPHATE 10 MG/ML IJ SOLN
INTRAMUSCULAR | Status: DC | PRN
  Administered 2017-05-04: 5 mg via INTRAVENOUS

## 2017-05-04 MED ORDER — HYDROMORPHONE HCL-NACL 0.5-0.9 MG/ML-% IV SOSY
PREFILLED_SYRINGE | INTRAVENOUS | Status: AC
  Administered 2017-05-04: 0.5 mg via INTRAVENOUS
  Filled 2017-05-04: qty 2

## 2017-05-04 MED ORDER — LACTATED RINGERS IV SOLN: 1000.0000 mL | Freq: Three times a day (TID) | INTRAVENOUS | Status: AC | PRN

## 2017-05-04 MED ORDER — SODIUM CHLORIDE 0.9 % IV SOLN
25.0000 mg | Freq: Four times a day (QID) | INTRAVENOUS | Status: DC | PRN
  Filled 2017-05-04: qty 1

## 2017-05-04 MED ORDER — DEXTROSE 5 % IV SOLN
2.0000 g | INTRAVENOUS | Status: AC
  Administered 2017-05-04: 2 g via INTRAVENOUS
  Filled 2017-05-04: qty 2

## 2017-05-04 MED ORDER — PROPOFOL 10 MG/ML IV BOLUS
INTRAVENOUS | Status: DC | PRN
  Administered 2017-05-04: 110 mg via INTRAVENOUS

## 2017-05-04 MED ORDER — ALUM & MAG HYDROXIDE-SIMETH 200-200-20 MG/5ML PO SUSP: 30.0000 mL | Freq: Four times a day (QID) | ORAL | Status: DC | PRN

## 2017-05-04 MED ORDER — BISACODYL 10 MG RE SUPP: 10.0000 mg | Freq: Every day | RECTAL | Status: DC | PRN

## 2017-05-04 MED ORDER — BUPIVACAINE LIPOSOME 1.3 % IJ SUSP
20.0000 mL | Freq: Once | INTRAMUSCULAR | Status: DC
  Filled 2017-05-04: qty 20

## 2017-05-04 MED ORDER — ONDANSETRON HCL 4 MG/2ML IJ SOLN
INTRAMUSCULAR | Status: DC | PRN
  Administered 2017-05-04: 4 mg via INTRAVENOUS

## 2017-05-04 MED ORDER — DIPHENHYDRAMINE HCL 12.5 MG/5ML PO ELIX: 12.5000 mg | ORAL_SOLUTION | Freq: Four times a day (QID) | ORAL | Status: DC | PRN

## 2017-05-04 MED ORDER — KETAMINE HCL 10 MG/ML IJ SOLN
INTRAMUSCULAR | Status: DC | PRN
  Administered 2017-05-04: 30 mg via INTRAVENOUS
  Administered 2017-05-04: 10 mg via INTRAVENOUS

## 2017-05-04 MED ORDER — ACETAMINOPHEN 500 MG PO TABS
1000.0000 mg | ORAL_TABLET | ORAL | Status: DC
  Filled 2017-05-04: qty 2

## 2017-05-04 MED ORDER — SUCCINYLCHOLINE CHLORIDE 200 MG/10ML IV SOSY
PREFILLED_SYRINGE | INTRAVENOUS | Status: DC | PRN
  Administered 2017-05-04: 100 mg via INTRAVENOUS

## 2017-05-04 MED ORDER — ONDANSETRON HCL 4 MG/2ML IJ SOLN
INTRAMUSCULAR | Status: AC
  Filled 2017-05-04: qty 2

## 2017-05-04 MED ORDER — PROPOFOL 10 MG/ML IV BOLUS
INTRAVENOUS | Status: AC
  Filled 2017-05-04: qty 20

## 2017-05-04 MED ORDER — BUPIVACAINE-EPINEPHRINE 0.25% -1:200000 IJ SOLN
INTRAMUSCULAR | Status: AC
  Filled 2017-05-04: qty 1

## 2017-05-04 MED ORDER — LACTATED RINGERS IV BOLUS (SEPSIS): 1000.0000 mL | Freq: Three times a day (TID) | INTRAVENOUS | Status: DC | PRN

## 2017-05-04 MED ORDER — LIP MEDEX EX OINT
1.0000 "application " | TOPICAL_OINTMENT | Freq: Two times a day (BID) | CUTANEOUS | Status: DC
  Administered 2017-05-05 – 2017-05-08 (×7): 1 via TOPICAL
  Filled 2017-05-04: qty 7

## 2017-05-04 MED ORDER — PHENYLEPHRINE 40 MCG/ML (10ML) SYRINGE FOR IV PUSH (FOR BLOOD PRESSURE SUPPORT)
PREFILLED_SYRINGE | INTRAVENOUS | Status: DC | PRN
  Administered 2017-05-04 (×5): 80 ug via INTRAVENOUS
  Administered 2017-05-04: 120 ug via INTRAVENOUS
  Administered 2017-05-04 (×3): 80 ug via INTRAVENOUS

## 2017-05-04 MED ORDER — SODIUM CHLORIDE 0.9 % IV SOLN
INTRAVENOUS | Status: AC
  Administered 2017-05-04 – 2017-05-06 (×3): via INTRAVENOUS

## 2017-05-04 MED ORDER — OXYCODONE HCL 5 MG/5ML PO SOLN
5.0000 mg | Freq: Once | ORAL | Status: DC | PRN
  Filled 2017-05-04: qty 5

## 2017-05-04 MED ORDER — LIDOCAINE 2% (20 MG/ML) 5 ML SYRINGE
INTRAMUSCULAR | Status: DC | PRN
  Administered 2017-05-04: 50 mg via INTRAVENOUS

## 2017-05-04 MED ORDER — MAGIC MOUTHWASH
15.0000 mL | Freq: Four times a day (QID) | ORAL | Status: DC | PRN
  Administered 2017-05-07: 15 mL via ORAL
  Filled 2017-05-04 (×2): qty 15

## 2017-05-04 MED ORDER — GABAPENTIN 300 MG PO CAPS
300.0000 mg | ORAL_CAPSULE | ORAL | Status: AC
  Administered 2017-05-04: 300 mg via ORAL
  Filled 2017-05-04: qty 1

## 2017-05-04 MED ORDER — ENOXAPARIN SODIUM 40 MG/0.4ML ~~LOC~~ SOLN
40.0000 mg | SUBCUTANEOUS | Status: DC
  Administered 2017-05-05 – 2017-05-08 (×4): 40 mg via SUBCUTANEOUS
  Filled 2017-05-04 (×4): qty 0.4

## 2017-05-04 MED ORDER — FENTANYL CITRATE (PF) 250 MCG/5ML IJ SOLN
INTRAMUSCULAR | Status: AC
  Filled 2017-05-04: qty 5

## 2017-05-04 MED ORDER — PHENOL 1.4 % MT LIQD: 1.0000 | OROMUCOSAL | Status: DC | PRN

## 2017-05-04 MED ORDER — DEXTROSE 5 % IV SOLN
2.0000 g | Freq: Two times a day (BID) | INTRAVENOUS | Status: AC
  Administered 2017-05-04: 2 g via INTRAVENOUS
  Filled 2017-05-04: qty 2

## 2017-05-04 MED ORDER — LACTATED RINGERS IR SOLN
Status: DC | PRN
  Administered 2017-05-04: 1000 mL

## 2017-05-04 MED ORDER — PROCHLORPERAZINE EDISYLATE 5 MG/ML IJ SOLN: 5.0000 mg | INTRAMUSCULAR | Status: DC | PRN

## 2017-05-04 MED ORDER — PHENYLEPHRINE 40 MCG/ML (10ML) SYRINGE FOR IV PUSH (FOR BLOOD PRESSURE SUPPORT)
PREFILLED_SYRINGE | INTRAVENOUS | Status: AC
Start: 2017-05-04 — End: 2017-05-04
  Filled 2017-05-04: qty 10

## 2017-05-04 MED ORDER — MEPERIDINE HCL 50 MG/ML IJ SOLN: 6.2500 mg | INTRAMUSCULAR | Status: DC | PRN

## 2017-05-04 MED ORDER — OXYCODONE HCL 5 MG PO TABS: 5.0000 mg | ORAL_TABLET | Freq: Once | ORAL | Status: DC | PRN

## 2017-05-04 MED ORDER — METOCLOPRAMIDE HCL 5 MG/ML IJ SOLN: 5.0000 mg | Freq: Four times a day (QID) | INTRAMUSCULAR | Status: DC | PRN

## 2017-05-04 MED ORDER — FENTANYL CITRATE (PF) 100 MCG/2ML IJ SOLN
INTRAMUSCULAR | Status: DC | PRN
  Administered 2017-05-04 (×5): 50 ug via INTRAVENOUS

## 2017-05-04 MED ORDER — DIPHENHYDRAMINE HCL 50 MG/ML IJ SOLN: 12.5000 mg | Freq: Four times a day (QID) | INTRAMUSCULAR | Status: DC | PRN

## 2017-05-04 MED ORDER — LACTATED RINGERS IV SOLN
INTRAVENOUS | Status: DC
  Administered 2017-05-04 (×2): via INTRAVENOUS

## 2017-05-04 MED ORDER — ONDANSETRON 4 MG PO TBDP: 4.0000 mg | ORAL_TABLET | Freq: Four times a day (QID) | ORAL | Status: DC | PRN

## 2017-05-04 MED ORDER — GUAIFENESIN-DM 100-10 MG/5ML PO SYRP: 10.0000 mL | ORAL_SOLUTION | ORAL | Status: DC | PRN

## 2017-05-04 MED ORDER — METHOCARBAMOL 1000 MG/10ML IJ SOLN
1000.0000 mg | Freq: Four times a day (QID) | INTRAVENOUS | Status: DC | PRN
  Administered 2017-05-04 – 2017-05-05 (×2): 1000 mg via INTRAVENOUS
  Filled 2017-05-04 (×3): qty 10

## 2017-05-04 MED ORDER — SCOPOLAMINE 1 MG/3DAYS TD PT72
MEDICATED_PATCH | TRANSDERMAL | Status: AC
  Filled 2017-05-04: qty 1

## 2017-05-04 MED ORDER — ROCURONIUM BROMIDE 50 MG/5ML IV SOSY
PREFILLED_SYRINGE | INTRAVENOUS | Status: DC | PRN
  Administered 2017-05-04: 40 mg via INTRAVENOUS
  Administered 2017-05-04: 10 mg via INTRAVENOUS

## 2017-05-04 MED ORDER — HYDROCORTISONE 2.5 % RE CREA
1.0000 "application " | TOPICAL_CREAM | Freq: Four times a day (QID) | RECTAL | Status: DC | PRN
  Filled 2017-05-04: qty 28.35

## 2017-05-04 MED ORDER — MENTHOL 3 MG MT LOZG: 1.0000 | LOZENGE | OROMUCOSAL | Status: DC | PRN

## 2017-05-04 MED ORDER — SUCCINYLCHOLINE CHLORIDE 200 MG/10ML IV SOSY
PREFILLED_SYRINGE | INTRAVENOUS | Status: AC
  Filled 2017-05-04: qty 10

## 2017-05-04 MED ORDER — HYDRALAZINE HCL 20 MG/ML IJ SOLN
5.0000 mg | INTRAMUSCULAR | Status: DC | PRN
Start: 2017-05-04 — End: 2017-05-08

## 2017-05-04 MED ORDER — ACETAMINOPHEN 500 MG PO TABS
1000.0000 mg | ORAL_TABLET | Freq: Three times a day (TID) | ORAL | Status: DC
  Filled 2017-05-04: qty 2

## 2017-05-04 MED ORDER — BUPIVACAINE LIPOSOME 1.3 % IJ SUSP
INTRAMUSCULAR | Status: DC | PRN
  Administered 2017-05-04: 20 mL

## 2017-05-04 MED ORDER — HYDROCORTISONE 1 % EX CREA: 1.0000 "application " | TOPICAL_CREAM | Freq: Three times a day (TID) | CUTANEOUS | Status: DC | PRN

## 2017-05-04 MED ORDER — HYDROMORPHONE HCL 1 MG/ML IJ SOLN
0.5000 mg | INTRAMUSCULAR | Status: DC | PRN
  Administered 2017-05-05: 2 mg via INTRAVENOUS
  Administered 2017-05-05: 1 mg via INTRAVENOUS
  Administered 2017-05-05: 2 mg via INTRAVENOUS
  Administered 2017-05-05 (×2): 1 mg via INTRAVENOUS
  Administered 2017-05-06 (×3): 2 mg via INTRAVENOUS
  Administered 2017-05-06: 1 mg via INTRAVENOUS
  Administered 2017-05-07: 2 mg via INTRAVENOUS
  Filled 2017-05-04: qty 1
  Filled 2017-05-04 (×4): qty 2
  Filled 2017-05-04 (×2): qty 1
  Filled 2017-05-04 (×2): qty 2
  Filled 2017-05-04: qty 1

## 2017-05-04 MED ORDER — ROCURONIUM BROMIDE 50 MG/5ML IV SOSY
PREFILLED_SYRINGE | INTRAVENOUS | Status: AC
  Filled 2017-05-04: qty 5

## 2017-05-04 MED ORDER — PROMETHAZINE HCL 25 MG/ML IJ SOLN: 6.2500 mg | INTRAMUSCULAR | Status: DC | PRN

## 2017-05-04 SURGICAL SUPPLY — 74 items
APPLIER CLIP 5 13 M/L LIGAMAX5 (MISCELLANEOUS)
APPLIER CLIP ROT 10 11.4 M/L (STAPLE)
APR CLP MED LRG 11.4X10 (STAPLE)
APR CLP MED LRG 5 ANG JAW (MISCELLANEOUS)
BAG SPEC RTRVL LRG 6X4 10 (ENDOMECHANICALS) ×1
BLADE SURG SZ11 CARB STEEL (BLADE) ×3 IMPLANT
CHLORAPREP W/TINT 26ML (MISCELLANEOUS) ×3 IMPLANT
CLIP APPLIE 5 13 M/L LIGAMAX5 (MISCELLANEOUS) IMPLANT
CLIP APPLIE ROT 10 11.4 M/L (STAPLE) IMPLANT
COVER SURGICAL LIGHT HANDLE (MISCELLANEOUS) ×3 IMPLANT
COVER TIP SHEARS 8 DVNC (MISCELLANEOUS) ×1 IMPLANT
COVER TIP SHEARS 8MM DA VINCI (MISCELLANEOUS) ×2
DECANTER SPIKE VIAL GLASS SM (MISCELLANEOUS) ×3 IMPLANT
DRAIN CHANNEL 19F RND (DRAIN) IMPLANT
DRAIN PENROSE 18X1/2 LTX STRL (DRAIN) ×2 IMPLANT
DRAPE ARM DVNC X/XI (DISPOSABLE) ×4 IMPLANT
DRAPE COLUMN DVNC XI (DISPOSABLE) ×1 IMPLANT
DRAPE DA VINCI XI ARM (DISPOSABLE) ×8
DRAPE DA VINCI XI COLUMN (DISPOSABLE) ×2
DRAPE WARM FLUID 44X44 (DRAPE) ×3 IMPLANT
DRSG TEGADERM 2-3/8X2-3/4 SM (GAUZE/BANDAGES/DRESSINGS) ×12 IMPLANT
ELECT REM PT RETURN 15FT ADLT (MISCELLANEOUS) ×3 IMPLANT
ENDOLOOP SUT PDS II  0 18 (SUTURE)
ENDOLOOP SUT PDS II 0 18 (SUTURE) IMPLANT
EVACUATOR SILICONE 100CC (DRAIN) IMPLANT
GAUZE SPONGE 2X2 8PLY STRL LF (GAUZE/BANDAGES/DRESSINGS) ×1 IMPLANT
GLOVE ECLIPSE 8.0 STRL XLNG CF (GLOVE) ×24 IMPLANT
GLOVE INDICATOR 8.0 STRL GRN (GLOVE) ×6 IMPLANT
GOWN STRL REUS W/TWL XL LVL3 (GOWN DISPOSABLE) ×15 IMPLANT
IRRIG SUCT STRYKERFLOW 2 WTIP (MISCELLANEOUS) ×3
IRRIGATION SUCT STRKRFLW 2 WTP (MISCELLANEOUS) ×1 IMPLANT
KIT BASIN OR (CUSTOM PROCEDURE TRAY) ×3 IMPLANT
NDL INSUFFLATION 14GA 120MM (NEEDLE) ×1 IMPLANT
NEEDLE HYPO 22GX1.5 SAFETY (NEEDLE) ×3 IMPLANT
NEEDLE INSUFFLATION 14GA 120MM (NEEDLE) ×3 IMPLANT
PACK CARDIOVASCULAR III (CUSTOM PROCEDURE TRAY) ×3 IMPLANT
PAD POSITIONING PINK XL (MISCELLANEOUS) ×3 IMPLANT
POUCH SPECIMEN RETRIEVAL 10MM (ENDOMECHANICALS) ×3 IMPLANT
RELOAD STAPLE 45 BLU REG DVNC (STAPLE) IMPLANT
RELOAD STAPLE 45 GRN THCK DVNC (STAPLE) IMPLANT
SCISSORS LAP 5X45 EPIX DISP (ENDOMECHANICALS) ×3 IMPLANT
SEAL CANN UNIV 5-8 DVNC XI (MISCELLANEOUS) ×4 IMPLANT
SEAL XI 5MM-8MM UNIVERSAL (MISCELLANEOUS) ×8
SEALER VESSEL DA VINCI XI (MISCELLANEOUS) ×2
SEALER VESSEL EXT DVNC XI (MISCELLANEOUS) ×1 IMPLANT
SOLUTION ELECTROLUBE (MISCELLANEOUS) ×3 IMPLANT
SPONGE GAUZE 2X2 STER 10/PKG (GAUZE/BANDAGES/DRESSINGS) ×2
SPONGE LAP 18X18 X RAY DECT (DISPOSABLE) ×1 IMPLANT
STAPLER 45 BLU RELOAD XI (STAPLE) ×1 IMPLANT
STAPLER 45 BLUE RELOAD XI (STAPLE) ×2
STAPLER 45 GREEN RELOAD XI (STAPLE) ×10
STAPLER 45 GRN RELOAD XI (STAPLE) ×5 IMPLANT
STAPLER SHEATH (SHEATH) ×2
STAPLER SHEATH ENDOWRIST DVNC (SHEATH) ×1 IMPLANT
SUT ETHIBOND 0 36 GRN (SUTURE) IMPLANT
SUT ETHIBOND NAB CT1 #1 30IN (SUTURE) IMPLANT
SUT MNCRL AB 4-0 PS2 18 (SUTURE) ×5 IMPLANT
SUT PDS AB 1 CT1 27 (SUTURE) ×4 IMPLANT
SUT PROLENE 2 0 SH DA (SUTURE) IMPLANT
SUT V-LOC BARB 180 2/0GR6 GS22 (SUTURE)
SUT VIC AB 2-0 SH 27 (SUTURE) ×3
SUT VIC AB 2-0 SH 27X BRD (SUTURE) ×1 IMPLANT
SUTURE V-LC BRB 180 2/0GR6GS22 (SUTURE) IMPLANT
SYR 10ML LL (SYRINGE) ×3 IMPLANT
SYR 20CC LL (SYRINGE) ×3 IMPLANT
TIP INNERVISION DETACH 40FR (MISCELLANEOUS) IMPLANT
TIP INNERVISION DETACH 50FR (MISCELLANEOUS) IMPLANT
TIP INNERVISION DETACH 56FR (MISCELLANEOUS) IMPLANT
TIPS INNERVISION DETACH 40FR (MISCELLANEOUS)
TOWEL OR 17X26 10 PK STRL BLUE (TOWEL DISPOSABLE) ×3 IMPLANT
TOWEL OR NON WOVEN STRL DISP B (DISPOSABLE) ×3 IMPLANT
TRAY FOLEY W/METER SILVER 16FR (SET/KITS/TRAYS/PACK) IMPLANT
TROCAR ADV FIXATION 5X100MM (TROCAR) ×3 IMPLANT
TUBING INSUFFLATION 10FT LAP (TUBING) ×3 IMPLANT

## 2017-05-04 NOTE — Anesthesia Preprocedure Evaluation (Signed)
Anesthesia Evaluation  Patient identified by MRN, date of birth, ID band Patient awake    Reviewed: Allergy & Precautions, NPO status , Patient's Chart, lab work & pertinent test results  Airway Mallampati: II  TM Distance: >3 FB Neck ROM: Full    Dental no notable dental hx.    Pulmonary neg pulmonary ROS,    Pulmonary exam normal breath sounds clear to auscultation       Cardiovascular hypertension, Pt. on medications Normal cardiovascular exam Rhythm:Regular Rate:Normal     Neuro/Psych  Headaches, negative neurological ROS  negative psych ROS   GI/Hepatic Neg liver ROS, GERD  Medicated,  Endo/Other  negative endocrine ROS  Renal/GU negative Renal ROS  negative genitourinary   Musculoskeletal negative musculoskeletal ROS (+) Arthritis , Osteoarthritis,    Abdominal   Peds negative pediatric ROS (+)  Hematology negative hematology ROS (+)   Anesthesia Other Findings   Reproductive/Obstetrics negative OB ROS                             Anesthesia Physical  Anesthesia Plan  ASA: II  Anesthesia Plan: General   Post-op Pain Management:    Induction: Intravenous  PONV Risk Score and Plan: 3 and Ondansetron, Dexamethasone and Midazolam  Airway Management Planned: Oral ETT  Additional Equipment:   Intra-op Plan:   Post-operative Plan: Extubation in OR  Informed Consent: I have reviewed the patients History and Physical, chart, labs and discussed the procedure including the risks, benefits and alternatives for the proposed anesthesia with the patient or authorized representative who has indicated his/her understanding and acceptance.   Dental advisory given  Plan Discussed with: CRNA and Surgeon  Anesthesia Plan Comments:         Anesthesia Quick Evaluation

## 2017-05-04 NOTE — Op Note (Addendum)
05/04/2017  3:54 PM  PATIENT:  Teresa Lozano  66 y.o. female  Patient Care Team: [name], NP as PCP - General (Internal Medicine) [name], MD as Consulting Physician (General Surgery) Armbruster, Carlota Raspberry, MD as Consulting Physician (Gastroenterology)  PRE-OPERATIVE DIAGNOSIS:  Chronic pyloric ulcer with gastric outlet obstruction  POST-OPERATIVE DIAGNOSIS:  Chronic pyloric ulcer with gastric outlet obstruction  PROCEDURE:   XI ROBOTIC DISTAL GASTRECTOMY TRUNCAL VAGOTOMY GASTROJEJUNOSTOMY (BILLROTH II RECONSTRUCTION) OMENTOPEXY of duodenal bulb  SURGEON:  [name], MD  ASSIST:  Leighton Ruff, MD, FACS.  ANESTHESIA:   local and general  EBL:  Total I/O In: 1000 [I.V.:1000] Out: 650 [Urine:600; Blood:50]  Delay start of Pharmacological VTE agent (>24hrs) due to surgical blood loss or risk of bleeding:  no  ANESTHESIA: 1. General anesthesia. 2. Local anesthetic in a field block around all port sites.  SPECIMEN:  Mediastinal hernia sac (not sent).  DRAINS:  A 19-French Blake drain goes from the right upper quadrant along the lesser curvature of the stomach into the mediastinum.  COUNTS:  YES  PLAN OF CARE: Admit to inpatient   PATIENT DISPOSITION:  PACU - hemodynamically stable.  INDICATION:   Patient with chronic ulcer disease most likely due to Helicobacter pylori which was treated and chronic nonsteroidal use.  Follow-up chronic prepyloric stricturing recurrent after prior dilatations endoscopically.  I offered distal gastrectomy with truncal vagotomy tumors removed acid portion with gastrojejunostomy.  Possible just gastrojejunostomy of specimen cannot be excised.  The anatomy & physiology of the foregut and digestive tract was discussed.  The gastric pathophysiology was discussed.  Natural history risks without surgery was discussed.   The patient's situation is not adequately controlled by medicines and other non-operative treatments.  I  feel the risks of no intervention will lead to serious problems that outweigh the operative risks; therefore, I recommended surgery to resect part of the stomach.  Need for a thorough workup to rule out the differential diagnosis and plan treatment was explained.  I explained laparoscopic techniques with possible need for an open approach.  Risks such as bleeding, infection, abscess, leak, injury to other organs, need for repair of tissues / organs, need for further treatment, heart attack, death, and other risks were discussed.   I noted a good likelihood this will help address the problem.  Goals of post-operative recovery were discussed as well.  Possibility that this will not correct all symptoms was explained.  Post-operative dysphagia, need for short-term liquid & pureed diet, inability to vomit, possibility of reherniation, possible need for medicines to help control symptoms in addition to surgery were discussed.  Possible need for a feeding tube was discussed.  We will work to minimize complications.   Educational handouts further explaining the pathology, treatment options, and dysphagia diet was given as well.  Questions were answered.  The patient expresses understanding & wishes to proceed with surgery.   OR FINDINGS:   Patient had moderately dilated stomach.  Smooth thickening in the prepyloric region over 4 cm consistent with the chronic pyloric stricture.  No lymphadenopathy.  No sign of metastatic disease.  Patient has an antecolic isoperistaltic loop gastrojejunostomy, Billroth II reconstruction..  DESCRIPTION:   Informed consent was confirmed.  The patient received IV antibiotics prior to incision.  The underwent general anesthesia without difficulty.  A Foley catheter sterilely placed.  The patient was positioned in split leg with arms tucked. The abdomen was prepped and draped in the sterile fashion.  Surgical  time-out confirmed our plan.  Induced Pneumoperitoneum through a Varess  needle in the left upper quadrant subcostal region.  I placed a 8 mm port in the left upper quadrant.  Inspection revealed no injury.  I freed some adhesions the left upper quadrant.  I placed more ports around the level of the umbilicus transversely.  I placed a Nathanson liver retractor through a right subcostal site and secured to the bed with the Ironman system to elevate the liver & falciform ligament and gallbladder off of the stomach and duodenal bulb.  Inspection revealed no evidence of metastatic disease or lymphadenopathy.  Could confirm thickening in the distal antrum at the pylorus several centimeters long consistent with a benign stricture.  Distal duodenal bulb soft and uninvolved.  Xi robot docked.  Proceeded with dissection around the duodenum.  I came through the greater omentum just distal to the antral gastroepiploics to get into the lesser sac.  Again confirmed no mass or tumor on the posterior antrum or pylorus.  Transected the omentum along the greater curvature and beyond the pylorus to the duodenal bulb.  Came through the hepatogastric ligament and took some of the fat off of the pancreas and porta hepatis.  No major lymphadenopathy but resect en bloc just in case.  Became more proximally in the lesser curve just proximal to the insertion at the larger obvious crows foot, a little over halfway down.  Then focused back on the greater curvature and came up more proximally a third up the greater curvature to prove that I was at the junction of body & antrum.  Hemostasis was good.  I proceeded with vagotomy.  I dissected the peritoneal coverings between the right and left crus.  Was able to get in the posterior mediastinum and elevate the esophagogastric junction anteriorly.  I could find the right posterior vagus nerve on the distal esophagus.  I came around anteriorly.  The left anterior esophagus at the esophageal hiatus revealed a less prominent vagus nerve.  I further skeletonized and  proved that it was indeed a thinner left anterior vagus nerve.  I skeletonized the left anterior vagus nerve 6 cm on the esophagus as well as a few centimeters on the cardia.  Transected it.  Did the same for the right posterior vagus nerve.  These were sent separately.  This completed truncal vagotomy.  I transected the stomach and junction between the body and antrum using a robotic 45 green load stapler x3 firings in an oblique fashion just proximal to the crows foot on the lesser curvature.  Then elevated and transected at the duodenal bulb that was soft, pink, and distal to the obvious prepyloric/pyloric stricture.  Healthy viable cuff.  Proceeded with anastomosis.  Elevated the transverse colon.  Found ligament of Treitz.  Ran jejunum 30 cm more distally.  Came antecolically over the mid distal transverse colon where there is very little omentum.  Reached to the remaining gastric remnant.  I created a posterior wall gastrotomy and antimesenteric jejunal enterotomy.  I did a 45 mm green load stapler between the posterior gastric wall and the antimesenteric jejunum.  I made sure the jejunum was coming isoperistaltic.  Fired.  Inspection confirmed a nice wide open patent anastomosis.  I closed the common staple defect transversely using 2-0 serrated V-lock absorbable suture with robotic intracorporeal suturing from the corners in a running [place]wah fashion.  That helped keep the gastrojejunal anastomosis open and patent to minimize stricturing.  I clamped the jejunum  distal to the [place] while anesthesia insufflated the stomach with low flow oxygen under water.  A good distention of the stomach & gastrojejunostomy..  Negative air leak test.  The anastomosis laid well.  Ran the V-lock suture inferior along the efferent distal gastrojejunal limb going inferiorly back down over the colon and mesocolon to help close off any potential internal defect.  Ran the other suture superiorly to have omentum covering over the  distal stomach staple line as well.  I then brought greater omentum and laid it over the duodenal bulb.  I used to 0 Vicryl suture to help patch that over it to help protect the duodenal bulb as a gastropexy.  Any sutures on the pancreas.  The patient is being extubated and brought back to the recovery room.  I discussed postop care in detail with the patient and family in in the office.  Discussed again with the patient in the holding area.  I discussed operative findings, updated the patient's status, discussed probable steps to recovery, and gave postoperative recommendations to the patient's family.  Recommendations were made.  Questions were answered.  He expressed understanding & appreciation.   [name], M.D., F.A.C.S. Gastrointestinal and Minimally Invasive Surgery [facility] [address] [place], [place] [address] [phone] Main / Paging

## 2017-05-04 NOTE — Transfer of Care (Signed)
Immediate Anesthesia Transfer of Care Note  Patient: Teresa Lozano  Procedure(s) Performed: Procedure(s): XI ROBOTIC DISTAL GASTRECTOMY WITH BILLROTH II RECONSTRUCTION WITH VAGOTOMY,  ERAS PATHWAY (N/A)  Patient Location: PACU  Anesthesia Type:General  Level of Consciousness:  sedated, patient cooperative and responds to stimulation  Airway & Oxygen Therapy:Patient Spontanous Breathing and Patient connected to face mask oxgen  Post-op Assessment:  Report given to PACU RN and Post -op Vital signs reviewed and stable  Post vital signs:  Reviewed and stable  Last Vitals:  Vitals:   05/04/17 1555 05/04/17 1600  BP: (!) 124/57   Pulse: (!) 102 98  Resp: 15 15  Temp: 36.6 C   SpO2: 814% 481%    Complications: No apparent anesthesia complications

## 2017-05-04 NOTE — H&P (View-Only) (Signed)
Teresa Lozano 04/11/2017 11:27 AM Location: [facility] Patient #: [id number] DOB: 1950-12-10 Widowed / Language: Cleophus Molt / Race: White Female  History of Present Illness [name] MD; 04/11/2017 12:50 PM) The patient is a 66 year old female who presents with a gastric outlet obstruction. Note for "Gastric outlet obstruction": ` ` ` Patient sent for surgical consultation at the request of Dr.  AFB Cellar, [facility]  Chief Complaint: Gastric Outlet Obstruction  The patient is a pleasant 66 year old female. She is to request stomach ulcer for some time. Usually at the pylorus. She struggle with chronic headaches and has depended on Excedrin usually. She is developed partial gastric outlet obstruction. His had prior endoscopies and dilations. However, she began to have worsening tolerance. Cannot tolerate any solid food. Depended on liquids. Unintentional weight loss. PPI medication was adjusted. Last endoscopy noted a pinhole opening. Not amenable to any more dilations. Biopsy consistent with ulcer disease. No cancer. He Helicobacter pylori negative. Gastroenterology recommended surgical consultation. She is been abstinent on nonsteroidals for over 3 months. Was felt that she was too strictured to tolerate dilations. Surgical consultation recommended. He went to [facility] due to insurance issues. She would like to stay in [place]. Therefore second opinion request for [facility], here.  Since she has stayed off the Excedrin, she fine she can tolerate mostly a full liquid diet. Mash potatoes. Sometimes chicken if she it her well. Not vomiting too much. Her weight is more stable and she actually regained a little bit weight. She is normal rather active. Used to hike 5 miles a day. A little less active now. She rarely drinks any alcohol. Maybe one or 2 daiquiri a year. Never smokes. No coffee. She's been compliant on increased omeprazole  proton pump inhibitor. She is frustrated that she cannot advance her diet and her food choices are extremely limited. She wishes to reconsider surgery.  No personal nor family history of GI/colon cancer, inflammatory bowel disease, irritable bowel syndrome, allergy such as Celiac Sprue, dietary/dairy problems, colitis, ulcers nor gastritis. No recent sick contacts/gastroenteritis. No travel outside the country. No changes in diet. No dysphagia to solids or liquids. No significant heartburn or reflux. No hematochezia, hematemesis, coffee ground emesis. No evidence of prior gastric/peptic ulceration.  (Review of systems as stated in this history (HPI) or in the review of systems. Otherwise all other 12 point ROS are negative)   Past Surgical History Illene Regulus, CMA; 04/11/2017 11:28 AM) Spinal Surgery - Neck Tonsillectomy  Diagnostic Studies History Lars Mage Spillers, CMA; 04/11/2017 11:28 AM) Colonoscopy >10 years ago Mammogram >3 years ago Pap Smear >5 years ago  Allergies Illene Regulus, CMA; 04/11/2017 11:30 AM) Lisinopril *CHEMICALS*  Medication History (Alisha Spillers, CMA; 04/11/2017 11:32 AM) AmLODIPine Besylate (10MG  Tablet, Oral) Active. Omeprazole (40MG  Capsule DR, Oral) Active. Tylenol 8 Hour (650MG  Tablet ER, Oral) Active. ZyrTEC Allergy (10MG  Tablet, Oral) Active. Prevacid (30MG  Capsule DR, Oral) Active. Magnesium (500MG  Tablet, Oral) Active. Zofran (4MG  Tablet, Oral) Active. Medications Reconciled  Social History Illene Regulus, CMA; 04/11/2017 11:28 AM) No alcohol use No caffeine use No drug use Tobacco use Former smoker.  Family History Illene Regulus, [place]; 04/11/2017 11:28 AM) Arthritis Father. Heart Disease Brother, Father. Hypertension Father, Mother, Sister. Kidney Disease Father. Migraine Headache Mother. Prostate Cancer Father.  Pregnancy / Birth History Illene Regulus, CMA; 04/11/2017 11:28 AM) Age at menarche  48 years. Age of menopause <45 Gravida 1 Maternal age 65-30 Para 1  Other Problems Illene Regulus, [place]; 04/11/2017 11:28  AM) Gastric Ulcer Gastroesophageal Reflux Disease Hemorrhoids High blood pressure Hypercholesterolemia Kidney Stone Migraine Headache     Review of Systems (Alisha Spillers CMA; 04/11/2017 11:28 AM) General Not Present- Appetite Loss, Chills, Fatigue, Fever, Night Sweats, Weight Gain and Weight Loss. Skin Not Present- Change in Wart/Mole, Dryness, Hives, Jaundice, New Lesions, Non-Healing Wounds, Rash and Ulcer. HEENT Present- Wears glasses/contact lenses. Not Present- Earache, Hearing Loss, Hoarseness, Nose Bleed, Oral Ulcers, Ringing in the Ears, Seasonal Allergies, Sinus Pain, Sore Throat, Visual Disturbances and Yellow Eyes. Respiratory Not Present- Bloody sputum, Chronic Cough, Difficulty Breathing, Snoring and Wheezing. Breast Not Present- Breast Mass, Breast Pain, Nipple Discharge and Skin Changes. Cardiovascular Present- Swelling of Extremities. Not Present- Chest Pain, Difficulty Breathing Lying Down, Leg Cramps, Palpitations, Rapid Heart Rate and Shortness of Breath. Gastrointestinal Present- Abdominal Pain, Excessive gas and Nausea. Not Present- Bloating, Bloody Stool, Change in Bowel Habits, Chronic diarrhea, Constipation, Difficulty Swallowing, Gets full quickly at meals, Hemorrhoids, Indigestion, Rectal Pain and Vomiting. Female Genitourinary Not Present- Frequency, Nocturia, Painful Urination, Pelvic Pain and Urgency. Musculoskeletal Present- Joint Pain and Swelling of Extremities. Not Present- Back Pain, Joint Stiffness, Muscle Pain and Muscle Weakness. Neurological Present- Headaches. Not Present- Decreased Memory, Fainting, Numbness, Seizures, Tingling, Tremor, Trouble walking and Weakness. Psychiatric Not Present- Anxiety, Bipolar, Change in Sleep Pattern, Depression, Fearful and Frequent crying. Endocrine Not Present- Cold Intolerance,  Excessive Hunger, Hair Changes, Heat Intolerance, Hot flashes and New Diabetes. Hematology Not Present- Blood Thinners, Easy Bruising, Excessive bleeding, Gland problems, HIV and Persistent Infections.  Vitals (Alisha Spillers CMA; 04/11/2017 11:30 AM) 04/11/2017 11:30 AM Weight: 122 lb Height: 61.5in Body Surface Area: 1.54 m Body Mass Index: 22.68 kg/m  Pulse: 74 (Regular)  BP: 142/78 (Sitting, Left Arm, Standard)      Assessment & Plan [name] MD; 04/11/2017 12:14 PM)  PARTIAL GASTRIC OUTLET OBSTRUCTION (K31.1) Impression: Chronic pyloric stricture assumed to ulcer disease due to heavy NSAID use.  She's regained some weight and is feeling a little bit better avoiding her Excedrin for severe headaches. These remained abstinent off nonsteroidals for the past 3 months.  However, she cannot advance beyond mashed potatoes and is really on a full liquid diet. She is frustrated and wishes to try and have a shot at surgery since she can have a better diet options and be less vulnerable to episodes of obstruction and nausea vomiting. Her son agrees.  Discussed at length with patient and my partners, Dr. Brantley Stage and Dr. Ninfa Linden. She is rather young and active, making the likelihood of worsening stricture imminent. She's done things to maximize nonoperative control with risk avoidance. On acid suppression but still struggles. We'll plan truncal vagotomy with antrectomy. Try and resect the ulcer as well. Billroth II reconstruction. Most likely would put in a feeding gastrojejunostomy to allow some jejunal feeds and gastric decompression. Excellent candidate for a minimally invasive approach. Robotic. She wishes to proceed.  Agree with her discussed with her primary care physician perhaps neurologist for workup her headaches and find a way to help control them so that she doesn't return to NSAIDs and risk stricturing her gastrojejunostomy.  PYLORIC ULCER, CHRONIC (K25.7)  Current  Plans You are being scheduled for surgery- Our schedulers will call you.  You should hear from our office's scheduling department within 5 working days about the location, date, and time of surgery. We try to make accommodations for patient's preferences in scheduling surgery, but sometimes the OR schedule or the surgeon's schedule prevents [place] from making  those accommodations.  If you have not heard from our office [phone]) in 5 working days, call the office and ask for your surgeon's nurse.  If you have other questions about your diagnosis, plan, or surgery, call the office and ask for your surgeon's nurse.  The anatomy & physiology of the foregut and digestive tract was discussed. The gastric pathophysiology was discussed. Natural history risks without surgery was discussed. The patient's situation is not adequately controlled by medicines and other non-operative treatments. I feel the risks of no intervention will lead to serious problems that outweigh the operative risks; therefore, I recommended surgery to resect part of the stomach. Need for a thorough workup to rule out the differential diagnosis and plan treatment was explained. I explained minimally invasive techniques with possible need for an open approach.  Risks such as bleeding, infection, abscess, leak, need for further treatment, heart attack, death, and other risks were discussed. I noted a good likelihood this will help address the problem. Goals of post-operative recovery were discussed as well. Possibility that this will not correct all symptoms was explained. Post-operative dysphagia, need for short-term liquid & pureed diet, inability to vomit, possibility of reherniation, possible need for medicines to help control symptoms in addition to surgery were discussed. Possible need for a feeding tube was discussed. Prolonged hospital stay with gastric ileus risks discussed. We will work to minimize complications.  Educational handouts further explaining the pathology, treatment options, and dysphagia diet was given as well. Questions were answered. The patient expresses understanding & wishes to proceed with surgery.  Pt Education - [facility] HCI Pt Education - Peptic Ulcer: discussed with patient and provided information.  [name], M.D., F.A.C.S. Gastrointestinal and Minimally Invasive Surgery [facility] [address], [place], [place] [address] [phone] Main / Paging

## 2017-05-04 NOTE — Anesthesia Postprocedure Evaluation (Signed)
Anesthesia Post Note  Patient: Teresa Lozano  Procedure(s) Performed: XI ROBOTIC DISTAL GASTRECTOMY WITH BILLROTH II RECONSTRUCTION WITH VAGOTOMY,  ERAS PATHWAY (N/A )     Patient location during evaluation: PACU Anesthesia Type: General Level of consciousness: awake and alert Pain management: pain level controlled Vital Signs Assessment: post-procedure vital signs reviewed and stable Respiratory status: spontaneous breathing, nonlabored ventilation and respiratory function stable Cardiovascular status: blood pressure returned to baseline and stable Postop Assessment: no apparent nausea or vomiting Anesthetic complications: no    Last Vitals:  Vitals:   05/04/17 1700 05/04/17 1728  BP: 112/64 132/74  Pulse: 86 89  Resp: 14 16  Temp: 36.6 C 36.6 C  SpO2: 100% 100%    Last Pain:  Vitals:   05/04/17 1700  TempSrc:   PainSc: [place]

## 2017-05-04 NOTE — Anesthesia Procedure Notes (Signed)
Procedure Name: Intubation Date/Time: 05/04/2017 1:04 PM Performed by: Montel Clock Pre-anesthesia Checklist: Patient identified, Emergency Drugs available, Suction available, Patient being monitored and Timeout performed Patient Re-evaluated:Patient Re-evaluated prior to induction Oxygen Delivery Method: Circle system utilized Preoxygenation: Pre-oxygenation with 100% oxygen Induction Type: IV induction Laryngoscope Size: Mac and 3 Grade View: Grade I Tube type: Oral Tube size: 7.0 mm Number of attempts: 1 Airway Equipment and Method: Stylet Placement Confirmation: ETT inserted through vocal cords under direct vision,  positive ETCO2 and breath sounds checked- equal and bilateral Secured at: 21 cm Tube secured with: Tape Dental Injury: Teeth and Oropharynx as per pre-operative assessment

## 2017-05-04 NOTE — Interval H&P Note (Signed)
History and Physical Interval Note:  05/04/2017 12:23 PM  Teresa Lozano  has presented today for surgery, with the diagnosis of chronic pyloric ulcer with gastric outlet obstruction  The various methods of treatment have been discussed with the patient and family. After consideration of risks, benefits and other options for treatment, the patient has consented to  Procedure(s): XI ROBOTIC [place] (N/A) as a surgical intervention .  The patient's history has been reviewed, patient examined, no change in status, stable for surgery.  I have reviewed the patient's chart and labs.  Questions were answered to the patient's satisfaction.    I have re-reviewed the the patient's records, history, medications, and allergies.  I have re-examined the patient.  I again discussed intraoperative plans and goals of post-operative recovery.  The patient agrees to proceed.  Teresa Lozano  February 19, 1951 [id number]  Patient Care Team: [name], NP as PCP - General (Internal Medicine) [name], MD as Consulting Physician (General Surgery) Armbruster, Carlota Raspberry, MD as Consulting Physician (Gastroenterology)  Patient Active Problem List   Diagnosis Date Noted  . Age-related osteoporosis without current pathological fracture 04/26/2017  . Gastric outlet obstruction   . HLD (hyperlipidemia) 12/01/2015  . Essential hypertension 11/10/2015  . Frequent headaches 11/10/2015  . GERD (gastroesophageal reflux disease) 11/10/2015  . Seasonal allergies 11/10/2015    Past Medical History:  Diagnosis Date  . Allergy   . Arthritis   . Chicken pox   . Frequent headaches   . GERD (gastroesophageal reflux disease)   . History of kidney stones   . Hyperlipidemia   . Hypertension   . Osteoporosis     Past Surgical History:  Procedure Laterality Date  . CERVICAL  FUSION  2000  . ESOPHAGOGASTRODUODENOSCOPY  11/2016  . ESOPHAGOGASTRODUODENOSCOPY (EGD) WITH PROPOFOL N/A 12/28/2016   Procedure: ESOPHAGOGASTRODUODENOSCOPY (EGD) WITH PROPOFOL;  Surgeon: Manus Gunning, MD;  Location: [facility];  Service: Gastroenterology;  Laterality: N/A;  . EYE SURGERY    . TONSILLECTOMY      Social History   Social History  . Marital status: Widowed    Spouse name: N/A  . Number of children: N/A  . Years of education: N/A   Occupational History  . Not on file.   Social History Main Topics  . Smoking status: Never Smoker  . Smokeless tobacco: Never Used  . Alcohol use No  . Drug use: No  . Sexual activity: No   Other Topics Concern  . Not on file   Social History Narrative  . No narrative on file    Family History  Problem Relation Age of Onset  . Hyperlipidemia Mother   . Hypertension Mother   . Prostate cancer Father   . Hyperlipidemia Father   . Heart disease Father   . Heart disease Brother   . Hypertension Sister   . Hypertension Maternal Grandmother   . Colon cancer Neg Hx     Prescriptions Prior to Admission  Medication Sig Dispense Refill Last Dose  . acetaminophen (TYLENOL) 650 MG CR tablet Take 1,300 mg by mouth 2 (two) times daily.   05/04/2017 at 0800  . amLODipine (NORVASC) 10 MG tablet Take 1 tablet (10 mg total) by mouth daily. 30 tablet 11 05/04/2017 at 0800  . atorvastatin (LIPITOR) 20 MG tablet Take 1 tablet (20 mg total) by mouth daily. 30 tablet 2 05/03/2017 at Unknown  time  . cetirizine (ZYRTEC) 10 MG tablet Take 10 mg by mouth daily.   05/04/2017 at 0800  . Magnesium 500 MG CAPS Take 500 mg by mouth 2 (two) times a week.    Past Week at Unknown time  . omeprazole (PRILOSEC) 40 MG capsule Take 40 mg by mouth 2 (two) times daily.    05/04/2017 at 0800  . ondansetron (ZOFRAN) 4 MG tablet Take 1 tablet (4 mg total) by mouth every 8 (eight) hours as needed. (Patient taking differently: Take 4 mg by mouth every 8  (eight) hours as needed for nausea or vomiting. ) 20 tablet 0 Past Week at Unknown time  . lansoprazole (PREVACID SOLUTAB) 30 MG disintegrating tablet Take 1 tablet (30 mg total) by mouth 2 (two) times daily. 90 tablet 1 Taking    Current Facility-Administered Medications  Medication Dose Route Frequency Provider Last Rate Last Dose  . acetaminophen (TYLENOL) tablet 1,000 mg  1,000 mg Oral On Call to OR [name], MD      . cefTRIAXone (ROCEPHIN) 2 g in dextrose 5 % 50 mL IVPB  2 g Intravenous On Call to OR [name], MD       And  . metroNIDAZOLE (FLAGYL) IVPB 500 mg  500 mg Intravenous On Call to OR [name], MD      . lactated ringers infusion   Intravenous Continuous Lynda Rainwater, MD 75 mL/hr at 05/04/17 1110       Allergies  Allergen Reactions  . Lisinopril Cough    BP (!) 154/87   Pulse (!) 109   Temp 97.8 F (36.6 C) (Oral)   Resp 16   Ht 5\' 2"  (1.575 m)   Wt 54.9 kg (121 lb)   SpO2 98%   BMI 22.13 kg/m   Labs: No results found for this or any previous visit (from the past 48 hour(s)).  Imaging / Studies: Dg Bone Density  Result Date: 04/20/2017 EXAM: DUAL X-RAY ABSORPTIOMETRY (DXA) FOR BONE MINERAL DENSITY IMPRESSION: Referring Physician:  [name] PATIENT: Name: Teresa, Lozano Patient ID: [id number] Birth Date: 09/08/1950 Height: 61.5 in. Sex: Female Measured: 04/20/2017 Weight: 120.2 lbs. Indications: Caucasian, Estrogen Deficient, Family History of Osteoporosis, Postmenopausal Fractures: None Treatments: None ASSESSMENT: The BMD measured at Femur Total Right is 0.572 g/cm2 with a T-score of -3.5. This patient is considered osteoporotic according to [facility]) criteria. Site Region Measured Date Measured Age YA BMD Significant CHANGE T-score DualFemur Total Right 04/20/2017    65.0         -3.5    0.572 g/cm2 AP Spine  L1-L4       04/20/2017    65.0         -2.0    0.944 g/cm2 World Health Organization [facility]) criteria for  post-menopausal, Caucasian Women: Normal       T-score at or above -1 SD Osteopenia   T-score between -1 and -2.5 SD Osteoporosis T-score at or below -2.5 SD RECOMMENDATION: [place] [facility] recommends that FDA-approved medical therapies be considered in postmenopausal women and men age 75 or older with a: 1. Hip or vertebral (clinical or morphometric) fracture. 2. T-score of <-2.5 at the spine or hip. 3. Ten-year fracture probability by FRAX of 3% or greater for hip fracture or 20% or greater for major osteoporotic fracture. All treatment decisions require clinical judgment and consideration of individual patient factors, including patient preferences, co-morbidities, previous drug use, risk factors not captured in the  FRAX model (e.g. falls, vitamin D deficiency, increased bone turnover, interval significant decline in bone density) and possible under - or over-estimation of fracture risk by FRAX. All patients should ensure an adequate intake of dietary calcium (1200 mg/d) and vitamin D (800 IU daily) unless contraindicated. FOLLOW-UP: People with diagnosed cases of osteoporosis or at high risk for fracture should have regular bone mineral density tests. For patients eligible for Medicare, routine testing is allowed once every 2 years. The testing frequency can be increased to one year for patients who have rapidly progressing disease, those who are receiving or discontinuing medical therapy to restore bone mass, or have additional risk factors. I have reviewed this report, and agree with the above findings. [facility] Radiology Electronically Signed   By: Marijo Conception, M.D.   On: 04/20/2017 10:39   Dg Hand Complete Right  Result Date: 04/07/2017 CLINICAL DATA:  Joint pain for 3 months, no known injury, initial encounter EXAM: RIGHT HAND - COMPLETE 3+ VIEW COMPARISON:  None. FINDINGS: Mild degenerative changes are noted in the distal interphalangeal joints. No soft tissue abnormality is  noted. No acute fracture or dislocation is seen. No erosive changes are noted. IMPRESSION: Mild degenerative changes without acute abnormality. Electronically Signed   By: Inez Catalina M.D.   On: 04/07/2017 16:43   Dg Foot Complete Left  Result Date: 04/07/2017 CLINICAL DATA:  Foot pain for several months, no known injury, initial encounter EXAM: LEFT FOOT - COMPLETE 3+ VIEW COMPARISON:  None. FINDINGS: No acute fracture or dislocation is noticed. No significant degenerative changes are seen. No erosive changes are noted. No soft tissue abnormality is seen. IMPRESSION: No acute abnormality noted. Electronically Signed   By: Inez Catalina M.D.   On: 04/07/2017 16:44   Mm Digital Screening Bilateral  Result Date: 04/05/2017 CLINICAL DATA:  Screening. EXAM: DIGITAL SCREENING BILATERAL MAMMOGRAM WITH CAD COMPARISON:  None. ACR Breast Density Category c: The breast tissue is heterogeneously dense, which may obscure small masses FINDINGS: There are no findings suspicious for malignancy. Images were processed with CAD. IMPRESSION: No mammographic evidence of malignancy. A result letter of this screening mammogram will be mailed directly to the patient. RECOMMENDATION: Screening mammogram in one year. (Code:SM-B-01Y) BI-RADS CATEGORY  1: Negative. Electronically Signed   By: Abelardo Diesel M.D.   On: 04/05/2017 16:07     .[name], M.D., F.A.C.S. Gastrointestinal and Minimally Invasive Surgery [facility] [address] [place], [place] [address] [phone] Main / Paging  05/04/2017 12:23 PM    Tysheena Ginzburg C.

## 2017-05-05 MED ORDER — OXYCODONE HCL 5 MG PO TABS
5.0000 mg | ORAL_TABLET | ORAL | Status: DC | PRN
  Administered 2017-05-07 (×3): 5 mg via ORAL
  Administered 2017-05-07: 10 mg via ORAL
  Administered 2017-05-08: 5 mg via ORAL
  Filled 2017-05-05 (×5): qty 1
  Filled 2017-05-05: qty 2
  Filled 2017-05-05: qty 1

## 2017-05-05 MED ORDER — BISACODYL 10 MG RE SUPP
10.0000 mg | Freq: Every day | RECTAL | Status: DC
  Administered 2017-05-05 – 2017-05-08 (×4): 10 mg via RECTAL
  Filled 2017-05-05 (×4): qty 1

## 2017-05-05 NOTE — Progress Notes (Signed)
Called MD regarding possibility of NG tube coming out after patient said the tape on her face/tube was twisted. Initial reading on date of insertion was 60.5 cm, today it measured 67 cm.  MD said to leave it as is for now.  Will continue to monitor pt for nausea and abdominal distention.

## 2017-05-05 NOTE — Progress Notes (Signed)
[facility]  [address] [place], [place] [address] Phone: [phone]  FAX: [phone]      Teresa Lozano [id number] [birth year]  CARE TEAM:  PCP: [name], NP  Outpatient Care Team: Patient Care Team: [name], NP as PCP - General (Internal Medicine) [name], MD as Consulting Physician (General Surgery) Armbruster, Carlota Raspberry, MD as Consulting Physician (Gastroenterology)  Inpatient Treatment Team: Treatment Team: Attending Provider: [name], MD; Registered Nurse: Neil Crouch D, RN   Problem List:   Principal Problem:   Chronic prepyloric ulcer with gastric outlet obstruction s/p distal gastrectomy 05/04/2017 Active Problems:   Essential hypertension   GERD (gastroesophageal reflux disease)   Gastric outlet obstruction   History of Billroth II gastrojejunostomy reconstruction 05/04/2017   History of truncal vagotomy for chronic ulcer disease 05/04/2017   1 Day Post-Op  05/04/2017   POST-OPERATIVE DIAGNOSIS:  Chronic pyloric ulcer with gastric outlet obstruction  PROCEDURE:   XI ROBOTIC DISTAL GASTRECTOMY TRUNCAL VAGOTOMY GASTROJEJUNOSTOMY (BILLROTH II RECONSTRUCTION) OMENTOPEXY of duodenal bulb  SURGEON:  [name], MD   Assessment  Recovering  Plan:  -NGT until flatus, then clamping trial -IVF with PRN backup -HTN - PRN backup for now -f/u pathology -VTE prophylaxis- SCDs, etc -mobilize as tolerated to help recovery  I updated the patient's status to the patient.  Recommendations were made.  Questions were answered.  She expressed understanding & appreciation.  20 minutes spent in review, evaluation, examination, counseling, and coordination of care.  More than 50% of that time was spent in counseling.  [name], M.D., F.A.C.S. Gastrointestinal and Minimally Invasive Surgery [facility] [address] [place], [place]  [address] [phone] Main / Paging   05/05/2017    Subjective: (Chief complaint)  Denies pain Walked in hallways  Objective:  Vital signs:  Vitals:   05/04/17 1828 05/04/17 2234 05/05/17 0142 05/05/17 0539  BP: 124/75 119/62 118/70 109/65  Pulse: 100 70 71 69  Resp: 20 18 16 17   Temp:  98 F (36.7 C) 98.4 F (36.9 C) 98.5 F (36.9 C)  TempSrc: Oral Oral Oral Oral  SpO2: 100% 98% 99% 95%  Weight:      Height:        Last BM Date: 05/04/17 (morning)  Intake/Output   Yesterday:  10/17 0701 - 10/18 0700 In: 2890 [P.O.:240; I.V.:2550; IV Piggyback:100] Out: 2895 [Urine:2645; Emesis/NG output:200; Blood:50] This shift:  Total I/O In: 48 [P.O.:240; I.V.:550; IV Piggyback:100] Out: 2200 [Urine:2000; Emesis/NG output:200]  Bowel function:  Flatus: No  BM:  No  Drain: Bilious   Physical Exam:  General: Pt awake/alert/oriented x4 in no acute distress Eyes: PERRL, normal EOM.  Sclera clear.  No icterus Neuro: CN II-XII intact w/o focal sensory/motor deficits. Lymph: No head/neck/groin lymphadenopathy Psych:  No delerium/psychosis/paranoia HENT: Normocephalic, Mucus membranes moist.  No thrush.  NGT in place Neck: Supple, No tracheal deviation Chest: No chest wall pain w good excursion CV:  Pulses intact.  Regular rhythm MS: Normal AROM mjr joints.  No obvious deformity  Abdomen: Soft.  Mildy distended.  Nontender.  No evidence of peritonitis.  No incarcerated hernias.  Ext:  No deformity.  No mjr edema.  No cyanosis Skin: No petechiae / purpura  Results:   Labs: No results found for this or any previous visit (from the past 48 hour(s)).  Imaging / Studies: No results found.  Medications / Allergies: per chart  Antibiotics: Anti-infectives    Start     Dose/Rate Route Frequency Ordered Stop   05/04/17 2200  cefoTEtan (CEFOTAN) 2 g in dextrose 5 % 50 mL IVPB     2 g 100 mL/hr over 30 Minutes Intravenous Every 12 hours 05/04/17 1803 05/04/17  2222   05/04/17 1028  cefTRIAXone (ROCEPHIN) 2 g in dextrose 5 % 50 mL IVPB     2 g 100 mL/hr over 30 Minutes Intravenous On call to O.R. 05/04/17 1028 05/04/17 1336   05/04/17 1028  metroNIDAZOLE (FLAGYL) IVPB 500 mg     500 mg 100 mL/hr over 60 Minutes Intravenous On call to O.R. 05/04/17 1028 05/04/17 1415        Note: Portions of this report may have been transcribed using voice recognition software. Every effort was made to ensure accuracy; however, inadvertent computerized transcription errors may be present.   Any transcriptional errors that result from this process are unintentional.     [name], M.D., F.A.C.S. Gastrointestinal and Minimally Invasive Surgery [facility] [address], [place], [place] [address] [phone] Main / Paging   05/05/2017

## 2017-05-06 NOTE — Progress Notes (Signed)
[facility]  [address] [place], [place] [address] Phone: [phone]  FAX: [phone]      Teresa Lozano [id number] [birth year]  CARE TEAM:  PCP: [name], NP  Outpatient Care Team: Patient Care Team: [name], NP as PCP - General (Internal Medicine) [name], MD as Consulting Physician (General Surgery) Armbruster, Carlota Raspberry, MD as Consulting Physician (Gastroenterology)  Inpatient Treatment Team: Treatment Team: Attending Provider: [name], MD; Registered Nurse: Neil Crouch D, RN; Student Nurse: McGugan, Jesicia C, Student-RN; Technician: Etheleen Sia, [place]; Case Manager: Guadalupe Maple, RN; Registered Nurse: Carlena Bjornstad, RN; Technician: Wynn Maudlin, NT; Registered Nurse: Dolores Patty, RN   Problem List:   Principal Problem:   Chronic prepyloric ulcer with gastric outlet obstruction s/p distal gastrectomy 05/04/2017 Active Problems:   Essential hypertension   GERD (gastroesophageal reflux disease)   Gastric outlet obstruction   History of Billroth II gastrojejunostomy reconstruction 05/04/2017   History of truncal vagotomy for chronic ulcer disease 05/04/2017   2 Days Post-Op  05/04/2017   POST-OPERATIVE DIAGNOSIS:  Chronic pyloric ulcer with gastric outlet obstruction  PROCEDURE:   XI ROBOTIC DISTAL GASTRECTOMY TRUNCAL VAGOTOMY GASTROJEJUNOSTOMY (BILLROTH II RECONSTRUCTION) OMENTOPEXY of duodenal bulb  SURGEON:  [name], MD   Assessment  Recovering  Plan:  -NGT clamping trial - if tolerates, d/c NGT in AM -IVF with PRN backup -HTN - PRN backup for now -f/u pathology -VTE prophylaxis- SCDs, etc -mobilize as tolerated to help recovery  I updated the patient's status to the patient.  Recommendations were made.  Questions were answered.  She expressed understanding & appreciation.  20 minutes spent in review, evaluation, examination, counseling,  and coordination of care.  More than 50% of that time was spent in counseling.  [name], M.D., F.A.C.S. Gastrointestinal and Minimally Invasive Surgery [facility] [address] [place], [place] [address] [phone] Main / Paging   05/06/2017    Subjective: (Chief complaint)  Denies pain except NGT annoying a little NGT pulled out ~ 2inches Walked in hallways  Objective:  Vital signs:  Vitals:   05/05/17 1000 05/05/17 1514 05/05/17 2137 05/06/17 0539  BP: 112/69 (!) 124/57 126/60 (!) 118/57  Pulse: 66 74 69 78  Resp: 16 16 16 16   Temp: 99.3 F (37.4 C) 99.4 F (37.4 C) 99.9 F (37.7 C) 99.2 F (37.3 C)  TempSrc: Oral Oral Oral Oral  SpO2: 96% 95% 95% 95%  Weight:      Height:        Last BM Date: 05/05/17 (small one)  Intake/Output   Yesterday:  10/18 0701 - 10/19 0700 In: 600 [I.V.:600] Out: 1750 [Urine:1450; Emesis/NG output:300] This shift:  No intake/output data recorded.  Bowel function:  Flatus: YES  BM:  YES  Drain: Bilious   Physical Exam:  General: Pt awake/alert/oriented x4 in no acute distress Eyes: PERRL, normal EOM.  Sclera clear.  No icterus Neuro: CN II-XII intact w/o focal sensory/motor deficits. Lymph: No head/neck/groin lymphadenopathy Psych:  No delerium/psychosis/paranoia HENT: Normocephalic, Mucus membranes moist.  No thrush.  NGT in place Neck: Supple, No tracheal deviation Chest: No chest wall pain w good excursion CV:  Pulses intact.  Regular rhythm MS: Normal AROM mjr joints.  No obvious deformity  Abdomen: Soft.  Nondistended.  Nontender.  No evidence of peritonitis.  No incarcerated hernias.  Ext:  No deformity.  No mjr edema.  No cyanosis  Skin: No petechiae / purpura  Results:   Labs: No results found for this or any previous visit (from the past 48 hour(s)).  Imaging / Studies: No results found.  Medications / Allergies: per  chart  Antibiotics: Anti-infectives    Start     Dose/Rate Route Frequency Ordered Stop   05/04/17 2200  cefoTEtan (CEFOTAN) 2 g in dextrose 5 % 50 mL IVPB     2 g 100 mL/hr over 30 Minutes Intravenous Every 12 hours 05/04/17 1803 05/04/17 2222   05/04/17 1028  cefTRIAXone (ROCEPHIN) 2 g in dextrose 5 % 50 mL IVPB     2 g 100 mL/hr over 30 Minutes Intravenous On call to O.R. 05/04/17 1028 05/04/17 1336   05/04/17 1028  metroNIDAZOLE (FLAGYL) IVPB 500 mg     500 mg 100 mL/hr over 60 Minutes Intravenous On call to O.R. 05/04/17 1028 05/04/17 1415        Note: Portions of this report may have been transcribed using voice recognition software. Every effort was made to ensure accuracy; however, inadvertent computerized transcription errors may be present.   Any transcriptional errors that result from this process are unintentional.     [name], M.D., F.A.C.S. Gastrointestinal and Minimally Invasive Surgery [facility] [address], [place], [place] [address] [phone] Main / Paging   05/06/2017

## 2017-05-07 MED ORDER — METHOCARBAMOL 500 MG PO TABS: 1000.0000 mg | ORAL_TABLET | Freq: Four times a day (QID) | ORAL | Status: DC | PRN

## 2017-05-07 MED ORDER — GABAPENTIN 300 MG PO CAPS
300.0000 mg | ORAL_CAPSULE | Freq: Every day | ORAL | Status: DC
  Administered 2017-05-07: 300 mg via ORAL
  Filled 2017-05-07: qty 1

## 2017-05-07 MED ORDER — LORATADINE 10 MG PO TABS
10.0000 mg | ORAL_TABLET | Freq: Every day | ORAL | Status: DC
  Administered 2017-05-07 – 2017-05-08 (×2): 10 mg via ORAL
  Filled 2017-05-07 (×2): qty 1

## 2017-05-07 MED ORDER — POLYETHYLENE GLYCOL 3350 17 G PO PACK
17.0000 g | PACK | Freq: Every day | ORAL | Status: DC
  Administered 2017-05-07 – 2017-05-08 (×2): 17 g via ORAL
  Filled 2017-05-07 (×2): qty 1

## 2017-05-07 MED ORDER — MAGNESIUM OXIDE 400 (241.3 MG) MG PO TABS: 400.0000 mg | ORAL_TABLET | ORAL | Status: DC

## 2017-05-07 MED ORDER — ACETAMINOPHEN 500 MG PO TABS
1000.0000 mg | ORAL_TABLET | Freq: Three times a day (TID) | ORAL | Status: DC
  Administered 2017-05-07 – 2017-05-08 (×3): 1000 mg via ORAL
  Filled 2017-05-07 (×5): qty 2

## 2017-05-07 MED ORDER — TAB-A-VITE/IRON PO TABS
1.0000 | ORAL_TABLET | Freq: Every day | ORAL | Status: DC
  Administered 2017-05-08: 1 via ORAL
  Filled 2017-05-07 (×3): qty 1

## 2017-05-07 MED ORDER — LACTATED RINGERS IV BOLUS (SEPSIS): 1000.0000 mL | Freq: Three times a day (TID) | INTRAVENOUS | Status: DC | PRN

## 2017-05-07 MED ORDER — ATORVASTATIN CALCIUM 20 MG PO TABS
20.0000 mg | ORAL_TABLET | Freq: Every day | ORAL | Status: DC
  Administered 2017-05-07 – 2017-05-08 (×2): 20 mg via ORAL
  Filled 2017-05-07 (×2): qty 1

## 2017-05-07 MED ORDER — PANTOPRAZOLE SODIUM 40 MG PO TBEC
40.0000 mg | DELAYED_RELEASE_TABLET | Freq: Every day | ORAL | Status: DC
  Administered 2017-05-07 – 2017-05-08 (×2): 40 mg via ORAL
  Filled 2017-05-07 (×2): qty 1

## 2017-05-07 NOTE — Progress Notes (Signed)
S/p Vagotomy/distal gastrectomy for prepyloric stricture Path benign. I told the pt the good news - copy of pathology report given to patient

## 2017-05-07 NOTE — Progress Notes (Signed)
NGT remains clamped, no C/O nausea or vomiting this shift, medicated once for abdominal pain with full relief, NGT checked for residual, no residual noted

## 2017-05-07 NOTE — Plan of Care (Signed)
Problem: Safety: Goal: Ability to remain free from injury will improve Outcome: Progressing No safety issues noted, patient is independent  Problem: Pain Managment: Goal: General experience of comfort will improve Outcome: Progressing Medicated once for pain with full relief  Problem: Physical Regulation: Goal: Will remain free from infection Outcome: Progressing No signs of infection noted, VS WNL  Problem: Tissue Perfusion: Goal: Risk factors for ineffective tissue perfusion will decrease Outcome: Progressing No S/S of DVT noted, SCDs are on  Problem: Activity: Goal: Risk for activity intolerance will decrease Outcome: Progressing Tolerates activities well, ambulated around nursing station 6 times this shift  Problem: Bowel/Gastric: Goal: Will not experience complications related to bowel motility Outcome: Progressing No nausea or vomiting noted this shift, NGT remains clamped

## 2017-05-07 NOTE — Progress Notes (Addendum)
[address] [place], [place] [address] Phone: [phone]  FAX: [phone]      Teresa Lozano [id number] April 07, 1951  CARE TEAM:  PCP: [name], NP  Outpatient Care Team: Patient Care Team: [name], NP as PCP - General (Internal Medicine) [name], MD as Consulting Physician (General Surgery) Armbruster, Carlota Raspberry, MD as Consulting Physician (Gastroenterology)  Inpatient Treatment Team: Treatment Team: Attending Provider: [name], MD; Registered Nurse: Neil Crouch D, RN; Student Nurse: McGugan, Jesicia C, Student-RN; Technician: Etheleen Sia, NT; Registered Nurse: Dolores Patty, RN; Registered Nurse: Rossie Muskrat, RN; Registered Nurse: Nolene Ebbs, RN   Problem List:   Principal Problem:   Chronic prepyloric ulcer with gastric outlet obstruction s/p distal gastrectomy 05/04/2017 Active Problems:   Essential hypertension   GERD (gastroesophageal reflux disease)   Gastric outlet obstruction   History of Billroth II gastrojejunostomy reconstruction 05/04/2017   History of truncal vagotomy for chronic ulcer disease 05/04/2017   3 Days Post-Op  05/04/2017   POST-OPERATIVE DIAGNOSIS:  Chronic pyloric ulcer with gastric outlet obstruction  PROCEDURE:   XI ROBOTIC DISTAL GASTRECTOMY TRUNCAL VAGOTOMY GASTROJEJUNOSTOMY (BILLROTH II RECONSTRUCTION) OMENTOPEXY of duodenal bulb  SURGEON:  [name], MD   Assessment  Recovering  Plan:  -NGT removed -adv to pureed diet -eventually advanced to anti-dumping syndrome diet.  Ask dietary/ nutrition to help with diet education. -IVF PRN backup -HTN - PRN backup for now -GERD - lower PPI daily only status post gastric resection. -f/u pathology - Benign stricture.  Printout of the pathology report left in room -VTE prophylaxis- SCDs, etc -mobilize as tolerated to help recovery  I updated the patient's  status to the patient and nurse.  Recommendations were made.  Questions were answered.  They expressed understanding & appreciation.  20 minutes spent in review, evaluation, examination, counseling, and coordination of care.  More than 50% of that time was spent in counseling.  [name], M.D., F.A.C.S. Gastrointestinal and Minimally Invasive Surgery [facility] [address] [place], [place] [address] [phone] Main / Paging   05/07/2017    Subjective: (Chief complaint)  Tolerating liquids without nausea.  Does not feel distended or bloated.  Occasional belching.  Sore nose and throat with NG tube.  Walking in hallways.  Objective:  Vital signs:  Vitals:   05/06/17 1000 05/06/17 1500 05/06/17 2137 05/07/17 0551  BP: 127/62 (!) 132/59 128/64 (!) 126/58  Pulse: 73 85 82 83  Resp: 16 14 16 16   Temp: 100.3 F (37.9 C) 100.3 F (37.9 C) 100 F (37.8 C) 99 F (37.2 C)  TempSrc:  Oral Oral Oral  SpO2: 97% 91% 93% 96%  Weight:      Height:        Last BM Date: 05/06/17  Intake/Output   Yesterday:  10/19 0701 - 10/20 0700 In: 700 [P.O.:700] Out: 6546 [Urine:1375; Emesis/NG output:275] This shift:  Total I/O In: -  Out: 350 [Urine:350]  Bowel function:  Flatus: YES  BM:  YES  Drain: clamped (minimal bile)   Physical Exam:  General: Pt awake/alert/oriented x4 in no acute distress Eyes: PERRL, normal EOM.  Sclera clear.  No icterus Neuro: CN II-XII intact w/o focal sensory/motor deficits. Lymph: No head/neck/groin lymphadenopathy Psych:  No delerium/psychosis/paranoia HENT: Normocephalic, Mucus membranes moist.  No thrush.  NGT in place Neck: Supple, No tracheal deviation Chest: No chest wall pain w good excursion  CV:  Pulses intact.  Regular rhythm MS: Normal AROM mjr joints.  No obvious deformity  Abdomen: Soft.  Mildy distended.  Nontender.  No evidence of peritonitis.  No incarcerated hernias.  Ext:   No deformity.  No mjr edema.  No cyanosis Skin: No petechiae / purpura  Results:   Labs: No results found for this or any previous visit (from the past 48 hour(s)).  Imaging / Studies: No results found.  Medications / Allergies: per chart  Antibiotics: Anti-infectives    Start     Dose/Rate Route Frequency Ordered Stop   05/04/17 2200  cefoTEtan (CEFOTAN) 2 g in dextrose 5 % 50 mL IVPB     2 g 100 mL/hr over 30 Minutes Intravenous Every 12 hours 05/04/17 1803 05/04/17 2222   05/04/17 1028  cefTRIAXone (ROCEPHIN) 2 g in dextrose 5 % 50 mL IVPB     2 g 100 mL/hr over 30 Minutes Intravenous On call to O.R. 05/04/17 1028 05/04/17 1336   05/04/17 1028  metroNIDAZOLE (FLAGYL) IVPB 500 mg     500 mg 100 mL/hr over 60 Minutes Intravenous On call to O.R. 05/04/17 1028 05/04/17 1415        Note: Portions of this report may have been transcribed using voice recognition software. Every effort was made to ensure accuracy; however, inadvertent computerized transcription errors may be present.   Any transcriptional errors that result from this process are unintentional.     [name], M.D., F.A.C.S. Gastrointestinal and Minimally Invasive Surgery [facility] [address] [place],  [address] [phone] Main / Paging   05/07/2017

## 2017-05-08 MED ORDER — TRAMADOL HCL 50 MG PO TABS: 50.0000 mg | ORAL_TABLET | Freq: Four times a day (QID) | ORAL | 0 refills | Status: DC | PRN

## 2017-05-08 MED ORDER — TRAMADOL HCL 50 MG PO TABS
50.0000 mg | ORAL_TABLET | Freq: Four times a day (QID) | ORAL | Status: DC | PRN
  Administered 2017-05-08 (×2): 50 mg via ORAL
  Filled 2017-05-08 (×2): qty 1

## 2017-05-08 MED ORDER — OMEPRAZOLE 40 MG PO CPDR: 40.0000 mg | DELAYED_RELEASE_CAPSULE | Freq: Every day | ORAL | 5 refills | Status: DC

## 2017-05-08 NOTE — Discharge Instructions (Signed)
Dumping Syndrome Diet Dumping syndrome is a term that is used to describe a group of symptoms that occur when the stomach empties too quickly. Changing how and what you eat can often help to control these symptoms. What is my plan? Your health care provider or dietitian may recommend specific changes to your diet to help prevent and treat symptoms. General recommendations include:  Eating 5-6 small meals each day.  Limiting fluid intake during meals to 4 oz.  Eating a balanced diet. ? Grains: 6-8 oz per day. ? Vegetables: 2-3 cups per day. ? Fruits: 1-2 cups per day. ? Meats and other protein sources: 5-6 oz per day. ? Dairy: 2-3 cups per day.  What do I need to know about a dumping syndrome diet?  Avoid drinking liquids for 30 minutes before meals.  Avoid drinking liquids for at least 30 minutes after meals.  Lie down for 15-30 minutes after meals. This will help to prevent lightheadedness and will slow down the emptying of your stomach.  Eat smaller portions of food.  Cut food into small pieces and chew thoroughly before you swallow.  Try to make sure that at least half of your daily servings of grains come from whole grains. These foods are high in fiber, which helps to slow down digestion.  Eat more complex carbohydrates, such as whole grains, vegetables, and beans.  Avoid eating simple carbohydrates and foods that have added sugar, such as fruit juices, white bread, pastries, and sweets. Foods that are high in sugar are especially likely to bring about symptoms.  With each meal and snack, eat one food that is high in protein.  Avoid dairy products if they aggravate your symptoms. Try lactose-free dairy products instead.  Avoid drinking alcohol.  Take a fiber supplement if recommended by your health care provider.  Increase the thickness of foods by adding plant-based thickening agents to them, such as pectin.  Avoid eating very hot or very cold foods. They may  aggravate your symptoms. What foods can I eat? Grains Whole-grain bread. Brown rice. Whole-grain pasta. Corn tortillas. Multigrain crackers. Vegetables All fresh, frozen, and canned vegetables. Fruits All fresh, frozen, and canned fruits that are packed in water. Unsweetened applesauce. Meats and Other Protein Sources Lean meats. Chicken. Fish. Eggs. Tofu. Deli meat. Nuts and seeds. Unsweetened nut butters. Beans. Dairy Low-fat or fat-free milk. Low-fat plain yogurt. Light yogurt. Cheese. Lactose-free dairy products. Beverages Water. Sugar-free, noncarbonated soft drinks. Unsweetened tea. Unsweetened coffee. Sugar-free hot chocolate. Unsweetened soy, almond, or rice milk. Condiments Sugar substitutes. Sugar-free jam and sugar-free jelly. Sugar-free syrup. Sweets and Desserts Sugar-free gelatin. Sugar-free pudding. Sugar-free ice cream. Fats and Oils Butter. Vegetable oil. Salad dressings. Mayonnaise. Avocado. Cream cheese. Other Broth. All spices and herbs. Over-the-counter fiber supplements. The items listed above may not be a complete list of recommended foods or beverages. Contact your dietitian for more options. What foods are not recommended? Grains Cereals with added sugar. Pastries. Cakes. Sweet and white breads. Flavored instant oatmeal. Vegetables Breaded vegetables. Fruits Fruits that are dried with added sugar. Canned fruit in syrup. Fruit juice. Meats and Other Protein Sources Breaded meats. Meats that are cooked with glazes or sweetened sauces. Dairy Flavored milk. Sugar-sweetened yogurt. Milkshakes. Beverages Hot chocolate. Regular sugar carbonated beverages. Diet carbonated beverages. Sweetened coconut milk. Sweetened coffee drinks. Condiments Honey. Sugar. Syrup. Jam and jelly. Molasses. Agave nectar. Sweets and Desserts Cakes. Candies. Ice Cream. Donuts. Puddings. Pies. Chocolate. Fats and Oils Frostings. Sweetened nut butters. Other Candied nuts.  Candied  fruit. The items listed above may not be a complete list of foods and beverages to avoid. Contact your dietitian for more information. This information is not intended to replace advice given to you by your health care provider. Make sure you discuss any questions you have with your health care provider. Document Released: 06/24/2011 Document Revised: 12/11/2015 Document Reviewed: 02/05/2014 Elsevier Interactive Patient Education  2018 [place]: POST OP INSTRUCTIONS (Surgery for stomach, small bowel obstruction, colon resection, etc)   ######################################################################  EAT Gradually transition to a high fiber diet with a fiber supplement over the next few days after discharge  WALK Walk an hour a day.  Control your pain to do that.    CONTROL PAIN Control pain so that you can walk, sleep, tolerate sneezing/coughing, go up/down stairs.  HAVE A BOWEL MOVEMENT DAILY Keep your bowels regular to avoid problems.  OK to try a laxative to override constipation.  OK to use an antidairrheal to slow down diarrhea.  Call if not better after 2 tries  CALL IF YOU HAVE PROBLEMS/CONCERNS Call if you are still struggling despite following these instructions. Call if you have concerns not answered by these instructions  ######################################################################   DIET Follow a light diet the first few days at home.  Start with a bland diet such as soups, liquids, starchy foods, low fat foods, etc.  If you feel full, bloated, or constipated, stay on a ful liquid or pureed/blenderized diet for a few days until you feel better and no longer constipated. Be sure to drink plenty of fluids every day to avoid getting dehydrated (feeling dizzy, not urinating, etc.). Gradually add a fiber supplement to your diet over the next week.  Gradually get back to a regular solid diet.  Avoid fast food or heavy meals the first week as  you are more likely to get nauseated. It is expected for your digestive tract to need a few months to get back to normal.  It is common for your bowel movements and stools to be irregular.  You will have occasional bloating and cramping that should eventually fade away.  Until you are eating solid food normally, off all pain medications, and back to regular activities; your bowels will not be normal. Focus on eating a low-fat, high fiber diet the rest of your life (See Getting to [place], below).  CARE of your INCISION or WOUND It is good for closed incision and even open wounds to be washed every day.  Shower every day.  Short baths are fine.  Wash the incisions and wounds clean with soap & water.    If you have a closed incision(s), wash the incision with soap & water every day.  You may leave closed incisions open to air if it is dry.   You may cover the incision with clean gauze & replace it after your daily shower for comfort. If you have skin tapes (Steristrips) or skin glue (Dermabond) on your incision, leave them in place.  They will fall off on their own like a scab.  You may trim any edges that curl up with clean scissors.  If you have staples, set up an appointment for them to be removed in the office in 10 days after surgery.  If you have a drain, wash around the skin exit site with soap & water and place a new dressing of gauze or band aid around the skin every day.  Keep the drain site  clean & dry.    If you have an open wound with packing, see wound care instructions.  In general, it is encouraged that you remove your dressing and packing, shower with soap & water, and replace your dressing once a day.  Pack the wound with clean gauze moistened with normal (0.9%) saline to keep the wound moist & uninfected.  Pressure on the dressing for 30 minutes will stop most wound bleeding.  Eventually your body will heal & pull the open wound closed over the next few months.  Raw open wounds  will occasionally bleed or secrete yellow drainage until it heals closed.  Drain sites will drain a little until the drain is removed.  Even closed incisions can have mild bleeding or drainage the first few days until the skin edges scab over & seal.   If you have an open wound with a wound vac, see wound vac care instructions.     ACTIVITIES as tolerated Start light daily activities --- self-care, walking, climbing stairs-- beginning the day after surgery.  Gradually increase activities as tolerated.  Control your pain to be active.  Stop when you are tired.  Ideally, walk several times a day, eventually an hour a day.   Most people are back to most day-to-day activities in a few weeks.  It takes 4-8 weeks to get back to unrestricted, intense activity. If you can walk 30 minutes without difficulty, it is safe to try more intense activity such as jogging, treadmill, bicycling, low-impact aerobics, swimming, etc. Save the most intensive and strenuous activity for last (Usually 4-8 weeks after surgery) such as sit-ups, heavy lifting, contact sports, etc.  Refrain from any intense heavy lifting or straining until you are off narcotics for pain control.  You will have off days, but things should improve week-by-week. DO NOT PUSH THROUGH PAIN.  Let pain be your guide: If it hurts to do something, don't do it.  Pain is your body warning you to avoid that activity for another week until the pain goes down. You may drive when you are no longer taking narcotic prescription pain medication, you can comfortably wear a seatbelt, and you can safely make sudden turns/stops to protect yourself without hesitating due to pain. You may have sexual intercourse when it is comfortable. If it hurts to do something, stop.  MEDICATIONS Take your usually prescribed home medications unless otherwise directed.   Blood thinners:  Usually you can restart any strong blood thinners after the second postoperative day.  It is OK  to take aspirin right away.     If you are on strong blood thinners (warfarin/Coumadin, Plavix, Xerelto, Eliquis, Pradaxa, etc), discuss with your surgeon, medicine PCP, and/or cardiologist for instructions on when to restart the blood thinner & if blood monitoring is needed (PT/INR blood check, etc).     PAIN CONTROL Pain after surgery or related to activity is often due to strain/injury to muscle, tendon, nerves and/or incisions.  This pain is usually short-term and will improve in a few months.  To help speed the process of healing and to get back to regular activity more quickly, DO THE FOLLOWING THINGS TOGETHER: 1. Increase activity gradually.  DO NOT PUSH THROUGH PAIN 2. Use Ice and/or Heat 3. Try Gentle Massage and/or Stretching 4. Take over the counter pain medication 5. Take Narcotic prescription pain medication for more severe pain  Good pain control = faster recovery.  It is better to take more medicine to be more active  than to stay in bed all day to avoid medications. 1.  Increase activity gradually Avoid heavy lifting at first, then increase to lifting as tolerated over the next 6 weeks. Do not push through the pain.  Listen to your body and avoid positions and maneuvers than reproduce the pain.  Wait a few days before trying something more intense Walking an hour a day is encouraged to help your body recover faster and more safely.  Start slowly and stop when getting sore.  If you can walk 30 minutes without stopping or pain, you can try more intense activity (running, jogging, aerobics, cycling, swimming, treadmill, sex, sports, weightlifting, etc.) Remember: If it hurts to do it, then dont do it! 2. Use Ice and/or Heat You will have swelling and bruising around the incisions.  This will take several weeks to resolve. Ice packs or heating pads (6-8 times a day, 30-60 minutes at a time) will help sooth soreness & bruising. Some people prefer to use ice alone, heat alone, or  alternate between ice & heat.  Experiment and see what works best for you.  Consider trying ice for the first few days to help decrease swelling and bruising; then, switch to heat to help relax sore spots and speed recovery. Shower every day.  Short baths are fine.  It feels good!  Keep the incisions and wounds clean with soap & water.   3. Try Gentle Massage and/or Stretching Massage at the area of pain many times a day Stop if you feel pain - do not overdo it 4. Take over the counter pain medication This helps the muscle and nerve tissues become less irritable and calm down faster Choose ONE of the following over-the-counter anti-inflammatory medications: Acetaminophen 500mg  tabs (Tylenol) 1-2 pills with every meal and just before bedtime (avoid if you have liver problems or if you have acetaminophen in you narcotic prescription) Naproxen 220mg  tabs (ex. Aleve, Naprosyn) 1-2 pills twice a day (avoid if you have kidney, stomach, IBD, or bleeding problems) Ibuprofen 200mg  tabs (ex. Advil, Motrin) 3-4 pills with every meal and just before bedtime (avoid if you have kidney, stomach, IBD, or bleeding problems) Take with food/snack several times a day as directed for at least 2 weeks to help keep pain / soreness down & more manageable. 5. Take Narcotic prescription pain medication for more severe pain A prescription for strong pain control is often given to you upon discharge (for example: oxycodone/Percocet, hydrocodone/Norco/Vicodin, or tramadol/Ultram) Take your pain medication as prescribed. Be mindful that most narcotic prescriptions contain Tylenol (acetaminophen) as well - avoid taking too much Tylenol. If you are having problems/concerns with the prescription medicine (does not control pain, nausea, vomiting, rash, itching, etc.), please call us [phone] to see if we need to switch you to a different pain medicine that will work better for you and/or control your side effects better. If  you need a refill on your pain medication, you must call the office before 4 pm and on weekdays only.  By federal law, prescriptions for narcotics cannot be called into a pharmacy.  They must be filled out on paper & picked up from our office by the patient or authorized caretaker.  Prescriptions cannot be filled after 4 pm nor on weekends.    WHEN TO CALL us [phone] Severe uncontrolled or worsening pain  Fever over 101 F (38.5 C) Concerns with the incision: Worsening pain, redness, rash/hives, swelling, bleeding, or drainage Reactions / problems with new medications (  itching, rash, hives, nausea, etc.) Nausea and/or vomiting Difficulty urinating Difficulty breathing Worsening fatigue, dizziness, lightheadedness, blurred vision Other concerns If you are not getting better after two weeks or are noticing you are getting worse, contact our office [phone] for further advice.  We may need to adjust your medications, re-evaluate you in the office, send you to the emergency room, or see what other things we can do to help. The clinic staff is available to answer your questions during regular business hours (8:30am-5pm).  Please dont hesitate to call and ask to speak to one of our nurses for clinical concerns.    A surgeon from [facility] is always on call at the hospitals 24 hours/day If you have a medical emergency, go to the nearest emergency room or call 911.  FOLLOW UP in our office One the day of your discharge from the hospital (or the next business weekday), please call [facility] to set up or confirm an appointment to see your surgeon in the office for a follow-up appointment.  Usually it is 2-3 weeks after your surgery.   If you have skin staples at your incision(s), let the office know so we can set up a time in the office for the nurse to remove them (usually around 10 days after surgery). Make sure that you call for appointments the day of  discharge (or the next business weekday) from the hospital to ensure a convenient appointment time. IF YOU HAVE DISABILITY OR FAMILY LEAVE FORMS, BRING THEM TO THE OFFICE FOR PROCESSING.  DO NOT GIVE THEM TO YOUR DOCTOR.  [facility] [address], [place], [place], [place]  [address] ? [phone] - Main [phone] - [place],  [phone] - Fax [email or web address]  GETTING TO GOOD BOWEL HEALTH. It is expected for your digestive tract to need a few months to get back to normal.  It is common for your bowel movements and stools to be irregular.  You will have occasional bloating and cramping that should eventually fade away.  Until you are eating solid food normally, off all pain medications, and back to regular activities; your bowels will not be normal.   Avoiding constipation The goal: ONE SOFT BOWEL MOVEMENT A DAY!    Drink plenty of fluids.  Choose water first. TAKE A FIBER SUPPLEMENT EVERY DAY THE REST OF YOUR LIFE During your first week back home, gradually add back a fiber supplement every day Experiment which form you can tolerate.   There are many forms such as powders, tablets, wafers, gummies, etc Psyllium bran (Metamucil), methylcellulose (Citrucel), Miralax or Glycolax, Benefiber, Flax Seed.  Adjust the dose week-by-week (1/2 dose/day to 6 doses a day) until you are moving your bowels 1-2 times a day.  Cut back the dose or try a different fiber product if it is giving you problems such as diarrhea or bloating. Sometimes a laxative is needed to help jump-start bowels if constipated until the fiber supplement can help regulate your bowels.  If you are tolerating eating & you are farting, it is okay to try a gentle laxative such as double dose MiraLax, prune juice, or Milk of Magnesia.  Avoid using laxatives too often. Stool softeners can sometimes help counteract the constipating effects of narcotic pain medicines.  It can also cause  diarrhea, so avoid using for too long. If you are still constipated despite taking fiber daily, eating solids, and a few doses of laxatives, call our office.  Controlling diarrhea Try drinking liquids and eating bland foods for a few days to avoid stressing your intestines further. Avoid dairy products (especially milk & ice cream) for a short time.  The intestines often can lose the ability to digest lactose when stressed. Avoid foods that cause gassiness or bloating.  Typical foods include beans and other legumes, cabbage, broccoli, and dairy foods.  Avoid greasy, spicy, fast foods.  Every person has some sensitivity to other foods, so listen to your body and avoid those foods that trigger problems for you. Probiotics (such as active yogurt, Align, etc) may help repopulate the intestines and colon with normal bacteria and calm down a sensitive digestive tract Adding a fiber supplement gradually can help thicken stools by absorbing excess fluid and retrain the intestines to act more normally.  Slowly increase the dose over a few weeks.  Too much fiber too soon can backfire and cause cramping & bloating. It is okay to try and slow down diarrhea with a few doses of antidiarrheal medicines.   Bismuth subsalicylate (ex. Kayopectate, Pepto Bismol) for a few doses can help control diarrhea.  Avoid if pregnant.   Loperamide (Imodium) can slow down diarrhea.  Start with one tablet (2mg ) first.  Avoid if you are having fevers or severe pain.  ILEOSTOMY PATIENTS WILL HAVE CHRONIC DIARRHEA since their colon is not in use.    Drink plenty of liquids.  You will need to drink even more glasses of water/liquid a day to avoid getting dehydrated. Record output from your ileostomy.  Expect to empty the bag every 3-4 hours at first.  Most people with a permanent ileostomy empty their bag 4-6 times at the least.   Use antidiarrheal medicine (especially Imodium) several times a day to avoid getting dehydrated.  Start with  a dose at bedtime & breakfast.  Adjust up or down as needed.  Increase antidiarrheal medications as directed to avoid emptying the bag more than 8 times a day (every 3 hours). Work with your wound ostomy nurse to learn care for your ostomy.  See ostomy care instructions. TROUBLESHOOTING IRREGULAR BOWELS 1) Start with a soft & bland diet. No spicy, greasy, or fried foods.  2) Avoid gluten/wheat or dairy products from diet to see if symptoms improve. 3) Miralax 17gm or flax seed mixed in [place]. water or juice-daily. May use 2-4 times a day as needed. 4) Gas-X, Phazyme, etc. as needed for gas & bloating.  5) Prilosec (omeprazole) over-the-counter as needed 6)  Consider probiotics (Align, Activa, etc) to help calm the bowels down  Call your doctor if you are getting worse or not getting better.  Sometimes further testing (cultures, endoscopy, X-ray studies, CT scans, bloodwork, etc.) may be needed to help diagnose and treat the cause of the diarrhea. [facility], [place], [place], [place], [place]  [address] [phone] - Main.    [phone]  - Toll Free.   [phone] - Fax [email or web address]   Peptic Ulcer A peptic ulcer is a sore in the lining of the esophagus (esophageal ulcer), the stomach (gastric ulcer), or the first part of the small intestine (duodenal ulcer). The ulcer causes gradual wearing away (erosion) into the deeper tissue. What are the causes? Normally, the lining of the stomach and the small intestine protects itself from the acid that digests food. The protective lining can be damaged by:  An infection caused by a germ (bacterium) called Helicobacter pylori or H. pylori.  Regular use  of NSAIDs, such as ibuprofen or aspirin.  Rare tumors in the stomach, small intestine, or pancreas (Zollinger-Ellison syndrome).  What increases the risk? The following factors may make you more likely to develop this  condition:  Smoking.  Having a family history of ulcer disease.  What are the signs or symptoms? Symptoms of this condition include:  Burning pain or gnawing in the area between the chest and the belly button. The pain may be worse on an empty stomach and at night.  Heartburn.  Nausea and vomiting.  Bloating.  If the ulcer results in bleeding, it can cause:  Black, tarry stools.  Vomiting of bright red blood.  Vomiting of material that looks like coffee grounds.  How is this diagnosed? This condition may be diagnosed based on:  Medical history and physical exam.  Various tests or procedures, such as: ? Blood tests, stool tests, or breath tests to check for the H. pylori bacterium. ? An X-ray exam (upper gastrointestinal series) of the esophagus, stomach, and small intestine. ? Upper endoscopy. The health care provider examines the esophagus, stomach, and small intestine using a small flexible tube that has a video camera at the end. ? Biopsy. A tissue sample is removed to be examined under a microscope.  How is this treated? Treatment for this condition may include:  Eliminating the cause of the ulcer, such as smoking or the use of NSAIDs or alcohol.  Medicines to reduce the amount of acid in your digestive tract.  Antibiotic medicines, if the ulcer is caused by the H. pylori bacterium.  An upper endoscopy to treat a bleeding ulcer.  Surgery, if the bleeding is severe or if the ulcer created a hole somewhere in the digestive system.  Follow these instructions at home:  Avoid alcohol and caffeine.  Do not use any tobacco products, such as cigarettes, chewing tobacco, and e-cigarettes. If you need help quitting, ask your health care provider.  Take over-the-counter and prescription medicines only as told by your health care provider. Do not use over-the-counter medicines in place of prescription medicines unless your health care provider approves.  Keep all  follow-up visits as told by your health care provider. This is important. Contact a health care provider if:  Your symptoms do not improve within 7 days of starting treatment.  You have ongoing indigestion or heartburn. Get help right away if:  You have sudden, sharp, or persistent pain in your abdomen.  You have bloody or dark black, tarry stools.  You vomit blood or material that looks like coffee grounds.  You become light-headed or you feel faint.  You become weak.  You become sweaty or clammy. This information is not intended to replace advice given to you by your health care provider. Make sure you discuss any questions you have with your health care provider. Document Released: 07/02/2000 Document Revised: 12/08/2015 Document Reviewed: 04/05/2015 Elsevier Interactive Patient Education  [employer].

## 2017-05-08 NOTE — Plan of Care (Signed)
Problem: Pain Managment: Goal: General experience of comfort will improve Outcome: Progressing Medicated once for pain with full relief, resting in the bed with eyes closed at present time, no acute distress noted  Problem: Bowel/Gastric: Goal: Will not experience complications related to bowel motility Outcome: Progressing No bowel complications reported

## 2017-05-08 NOTE — Care Management Important Message (Signed)
Important Message  Patient Details  Name: Teresa Lozano MRN: [id number] Date of Birth: December 19, 1950   Medicare Important Message Given:  Yes    Erenest Rasher, RN 05/08/2017, 3:47 PM

## 2017-05-08 NOTE — Progress Notes (Signed)
Assessment unchanged. Pt verbalized understanding of dc instructions through teach back including follow up care and when to call the doctor. Script x1 given as provided by MD. Discharged via wc to front entrance accompanied by Husband and NT.

## 2017-05-08 NOTE — Discharge Summary (Signed)
Physician Discharge Summary  Patient ID: Teresa Lozano MRN: [id number] DOB/AGE: May 29, 1951  66 y.o.  Admit date: 05/04/2017 Discharge date: 05/08/2017   Patient Care Team: [name], NP as PCP - General (Internal Medicine) [name], MD as Consulting Physician (General Surgery) Armbruster, Carlota Raspberry, MD as Consulting Physician (Gastroenterology)  Discharge Diagnoses:  Principal Problem:   Chronic prepyloric ulcer with gastric outlet obstruction s/p distal gastrectomy 05/04/2017 Active Problems:   Essential hypertension   GERD (gastroesophageal reflux disease)   Gastric outlet obstruction   History of Billroth II gastrojejunostomy reconstruction 05/04/2017   History of truncal vagotomy for chronic ulcer disease 05/04/2017   4 Days Post-Op  05/04/2017  POST-OPERATIVE DIAGNOSIS:  Chronic pyloric ulcer with gastric outlet obstruction  PROCEDURE:   XI ROBOTIC DISTAL GASTRECTOMY TRUNCAL VAGOTOMY GASTROJEJUNOSTOMY (BILLROTH II RECONSTRUCTION) OMENTOPEXY of duodenal bulb  SURGEON:  [name], MD  Diagnosis 1. Vagus nerve, left anterior - PERIPHERAL NERVE CONSISTENT WITH VAGUS NERVE. - NO EVIDENCE OF MALIGNANCY. 2. Vagus nerve, right posterior - PERIPHERAL NERVE CONSISTENT WITH VAGUE NERVE. - NO EVIDENCE OF MALIGNANCY. 3. Stomach, resection, distal - PYLORIC FIBROMUSCULAR HYPERTROPHY CONSISTENT WITH STENOSIS. - CHRONIC MUCOSAL GASTRITIS. - NO DYSPLASIA OR MALIGNANCY. Microscopic Comment 3. The pyloric sections show marked fibromuscular hypertrophy with luminal narrowing consistent with stenosis. There is no evidence of malignancy. (JDP:ah 05/06/17) Claudette Laws MD Pathologist, Electronic Signature (Case signed 05/06/2017)  Consults: None  Hospital Course:   The patient underwent the surgery above.  Postoperatively, the patient gradually mobilized and advanced to a solid diet.  Pain and other symptoms were treated aggressively.    By the time of  discharge, the patient was walking well the hallways, eating food, having flatus.  Pain was well-controlled on an oral medications.  Based on meeting discharge criteria and continuing to recover, I felt it was safe for the patient to be discharged from the hospital to further recover with close followup. Postoperative recommendations were discussed in detail.  They are written as well.  Discharged Condition: good  Disposition:  Follow-up Information    [name], MD. Schedule an appointment as soon as possible for a visit in 3 week(s).   Specialty:  General Surgery Why:  To follow up after your operation, To follow up after your hospital stay Contact information: [address] [place] [address] [phone]           01-Home or Self Care  Discharge Instructions    Call MD for:    Complete by:  As directed    FEVER > 101.5 F  (temperatures < 101.5 F are not significant)   Call MD for:    Complete by:  As directed    FEVER > 101.5 F  (temperatures < 101.5 F are not significant)   Call MD for:  extreme fatigue    Complete by:  As directed    Call MD for:  extreme fatigue    Complete by:  As directed    Call MD for:  persistant dizziness or light-headedness    Complete by:  As directed    Call MD for:  persistant dizziness or light-headedness    Complete by:  As directed    Call MD for:  persistant nausea and vomiting    Complete by:  As directed    Call MD for:  persistant nausea and vomiting    Complete by:  As directed    Call MD for:  redness, tenderness, or  signs of infection (pain, swelling, redness, odor or green/yellow discharge around incision site)    Complete by:  As directed    Call MD for:  redness, tenderness, or signs of infection (pain, swelling, redness, odor or green/yellow discharge around incision site)    Complete by:  As directed    Call MD for:  severe uncontrolled pain    Complete by:  As directed    Call MD for:  severe  uncontrolled pain    Complete by:  As directed    Diet - low sodium heart healthy    Complete by:  As directed    Follow a light diet the first few days at home.   Start with a bland diet such as soups, liquids, starchy foods, low fat foods, etc.   If you feel full, bloated, or constipated, stay on a full liquid or pureed/blenderized diet for a few days until you feel better and no longer constipated. Be sure to drink plenty of fluids every day to avoid getting dehydrated (feeling dizzy, not urinating, etc.). Gradually add a fiber supplement to your diet   Discharge instructions    Complete by:  As directed    See Discharge Instructions If you are not getting better after two weeks or are noticing you are getting worse, contact our office [phone] for further advice.  We may need to adjust your medications, re-evaluate you in the office, send you to the emergency room, or see what other things we can do to help. The clinic staff is available to answer your questions during regular business hours (8:30am-5pm).  Please don't hesitate to call and ask to speak to one of our nurses for clinical concerns.    A surgeon from [facility] is always on call at the hospitals 24 hours/day If you have a medical emergency, go to the nearest emergency room or call 911.   Discharge instructions    Complete by:  As directed    See Discharge Instructions If you are not getting better after two weeks or are noticing you are getting worse, contact our office [phone] for further advice.  We may need to adjust your medications, re-evaluate you in the office, send you to the emergency room, or see what other things we can do to help. The clinic staff is available to answer your questions during regular business hours (8:30am-5pm).  Please don't hesitate to call and ask to speak to one of our nurses for clinical concerns.    A surgeon from [facility] is always on call at the  hospitals 24 hours/day If you have a medical emergency, go to the nearest emergency room or call 911.   Driving Restrictions    Complete by:  As directed    You may drive when you are no longer taking narcotic prescription pain medication, you can comfortably wear a seatbelt, and you can safely make sudden turns/stops to protect yourself without hesitating due to pain.   Driving Restrictions    Complete by:  As directed    You may drive when you are no longer taking narcotic prescription pain medication, you can comfortably wear a seatbelt, and you can safely make sudden turns/stops to protect yourself without hesitating due to pain.   Increase activity slowly    Complete by:  As directed    Start light daily activities --- self-care, walking, climbing stairs- beginning the day after surgery.  Gradually increase activities as tolerated.  Control  your pain to be active.  Stop when you are tired.  Ideally, walk several times a day, eventually an hour a day.   Most people are back to most day-to-day activities in a few weeks.  It takes 4-8 weeks to get back to unrestricted, intense activity. If you can walk 30 minutes without difficulty, it is safe to try more intense activity such as jogging, treadmill, bicycling, low-impact aerobics, swimming, etc. Save the most intensive and strenuous activity for last (Usually 4-8 weeks after surgery) such as sit-ups, heavy lifting, contact sports, etc.  Refrain from any intense heavy lifting or straining until you are off narcotics for pain control.  You will have off days, but things should improve week-by-week. DO NOT PUSH THROUGH PAIN.  Let pain be your guide: If it hurts to do something, don't do it.  Pain is your body warning you to avoid that activity for another week until the pain goes down.   Increase activity slowly    Complete by:  As directed    Start light daily activities --- self-care, walking, climbing stairs- beginning the day after surgery.   Gradually increase activities as tolerated.  Control your pain to be active.  Stop when you are tired.  Ideally, walk several times a day, eventually an hour a day.   Most people are back to most day-to-day activities in a few weeks.  It takes 4-8 weeks to get back to unrestricted, intense activity. If you can walk 30 minutes without difficulty, it is safe to try more intense activity such as jogging, treadmill, bicycling, low-impact aerobics, swimming, etc. Save the most intensive and strenuous activity for last (Usually 4-8 weeks after surgery) such as sit-ups, heavy lifting, contact sports, etc.  Refrain from any intense heavy lifting or straining until you are off narcotics for pain control.  You will have off days, but things should improve week-by-week. DO NOT PUSH THROUGH PAIN.  Let pain be your guide: If it hurts to do something, don't do it.  Pain is your body warning you to avoid that activity for another week until the pain goes down.   Lifting restrictions    Complete by:  As directed    If you can walk 30 minutes without difficulty, it is safe to try more intense activity such as jogging, treadmill, bicycling, low-impact aerobics, swimming, etc. Save the most intensive and strenuous activity for last (Usually 4-8 weeks after surgery) such as sit-ups, heavy lifting, contact sports, etc.  Refrain from any intense heavy lifting or straining until you are off narcotics for pain control.  You will have off days, but things should improve week-by-week. DO NOT PUSH THROUGH PAIN.  Let pain be your guide: If it hurts to do something, don't do it.  Pain is your body warning you to avoid that activity for another week until the pain goes down.   Lifting restrictions    Complete by:  As directed    If you can walk 30 minutes without difficulty, it is safe to try more intense activity such as jogging, treadmill, bicycling, low-impact aerobics, swimming, etc. Save the most intensive and strenuous  activity for last (Usually 4-8 weeks after surgery) such as sit-ups, heavy lifting, contact sports, etc.  Refrain from any intense heavy lifting or straining until you are off narcotics for pain control.  You will have off days, but things should improve week-by-week. DO NOT PUSH THROUGH PAIN.  Let pain be your guide: If it hurts to  do something, don't do it.  Pain is your body warning you to avoid that activity for another week until the pain goes down.   May walk up steps    Complete by:  As directed    May walk up steps    Complete by:  As directed    No wound care    Complete by:  As directed    It is good for closed incision and even open wounds to be washed every day.  Shower every day.  Short baths are fine.  Wash the incisions and wounds clean with soap & water.    If you have a closed incision(s), wash the incision with soap & water every day.  You may leave closed incisions open to air if it is dry.   You may cover the incision with clean gauze & replace it after your daily shower for comfort. If you have skin tapes (Steristrips) or skin glue (Dermabond) on your incision, leave them in place.  They will fall off on their own like a scab.  You may trim any edges that curl up with clean scissors.  If you have staples, set up an appointment for them to be removed in the office in 10 days after surgery.  If you have a drain, wash around the skin exit site with soap & water and place a new dressing of gauze or band aid around the skin every day.  Keep the drain site clean & dry.   No wound care    Complete by:  As directed    It is good for closed incision and even open wounds to be washed every day.  Shower every day.  Short baths are fine.  Wash the incisions and wounds clean with soap & water.    If you have a closed incision(s), wash the incision with soap & water every day.  You may leave closed incisions open to air if it is dry.   You may cover the incision with clean gauze & replace it  after your daily shower for comfort. If you have skin tapes (Steristrips) or skin glue (Dermabond) on your incision, leave them in place.  They will fall off on their own like a scab.  You may trim any edges that curl up with clean scissors.  If you have staples, set up an appointment for them to be removed in the office in 10 days after surgery.  If you have a drain, wash around the skin exit site with soap & water and place a new dressing of gauze or band aid around the skin every day.  Keep the drain site clean & dry.   Sexual Activity Restrictions    Complete by:  As directed    You may have sexual intercourse when it is comfortable. If it hurts to do something, stop.   Sexual Activity Restrictions    Complete by:  As directed    You may have sexual intercourse when it is comfortable. If it hurts to do something, stop.      Allergies as of 05/08/2017      Reactions   Lisinopril Cough      Medication List    TAKE these medications   acetaminophen 650 MG CR tablet Commonly known as:  TYLENOL Take 1,300 mg by mouth 2 (two) times daily.   amLODipine 10 MG tablet Commonly known as:  NORVASC Take 1 tablet (10 mg total) by mouth daily.   atorvastatin 20  MG tablet Commonly known as:  LIPITOR Take 1 tablet (20 mg total) by mouth daily.   cetirizine 10 MG tablet Commonly known as:  ZYRTEC Take 10 mg by mouth daily.   Magnesium 500 MG Caps Take 500 mg by mouth 2 (two) times a week.   omeprazole 40 MG capsule Commonly known as:  PRILOSEC Take 1 capsule (40 mg total) by mouth daily. What changed:  when to take this   ondansetron 4 MG tablet Commonly known as:  ZOFRAN Take 1 tablet (4 mg total) by mouth every 8 (eight) hours as needed. What changed:  reasons to take this   traMADol 50 MG tablet Commonly known as:  ULTRAM Take 1-2 tablets (50-100 mg total) by mouth every 6 (six) hours as needed for moderate pain or severe pain.       Significant Diagnostic Studies:  No  results found for this or any previous visit (from the past 72 hour(s)).  No results found.  Discharge Exam: Blood pressure 113/61, pulse 79, temperature 99.1 F (37.3 C), temperature source Oral, resp. rate 16, height 5\' 2"  (1.575 m), weight 54.9 kg (121 lb), SpO2 96 %.  General: Pt awake/alert/oriented x4 in No acute distress Eyes: PERRL, normal EOM.  Sclera clear.  No icterus Neuro: CN II-XII intact w/o focal sensory/motor deficits. Lymph: No head/neck/groin lymphadenopathy Psych:  No delerium/psychosis/paranoia HENT: Normocephalic, Mucus membranes moist.  No thrush Neck: Supple, No tracheal deviation Chest: No chest wall pain w good excursion CV:  Pulses intact.  Regular rhythm MS: Normal AROM mjr joints.  No obvious deformity Abdomen: Soft.  Mildly distended.  Nontender.  No evidence of peritonitis.  No incarcerated hernias. Ext:  SCDs BLE.  No mjr edema.  No cyanosis Skin: No petechiae / purpura  Past Medical History:  Diagnosis Date  . Allergy   . Arthritis   . Chicken pox   . Frequent headaches   . GERD (gastroesophageal reflux disease)   . History of kidney stones   . Hyperlipidemia   . Hypertension   . Osteoporosis     Past Surgical History:  Procedure Laterality Date  . CERVICAL FUSION  2000  . ESOPHAGOGASTRODUODENOSCOPY  11/2016  . ESOPHAGOGASTRODUODENOSCOPY (EGD) WITH PROPOFOL N/A 12/28/2016   Procedure: ESOPHAGOGASTRODUODENOSCOPY (EGD) WITH PROPOFOL;  Surgeon: Manus Gunning, MD;  Location: [facility];  Service: Gastroenterology;  Laterality: N/A;  . EYE SURGERY    . TONSILLECTOMY      Social History   Social History  . Marital status: Widowed    Spouse name: N/A  . Number of children: N/A  . Years of education: N/A   Occupational History  . Not on file.   Social History Main Topics  . Smoking status: Never Smoker  . Smokeless tobacco: Never Used  . Alcohol use No  . Drug use: No  . Sexual activity: No   Other Topics Concern  .  Not on file   Social History Narrative  . No narrative on file    Family History  Problem Relation Age of Onset  . Hyperlipidemia Mother   . Hypertension Mother   . Prostate cancer Father   . Hyperlipidemia Father   . Heart disease Father   . Heart disease Brother   . Hypertension Sister   . Hypertension Maternal Grandmother   . Colon cancer Neg Hx     Current Facility-Administered Medications  Medication Dose Route Frequency Provider Last Rate Last Dose  . acetaminophen (TYLENOL) tablet 1,000 mg  1,000 mg Oral Tor Netters, MD   1,000 mg at 05/08/17 5427  . alum & mag hydroxide-simeth (MAALOX/MYLANTA) 200-200-20 MG/5ML suspension 30 mL  30 mL Oral Q6H PRN [name], MD      . atorvastatin (LIPITOR) tablet 20 mg  20 mg Oral Daily [name], MD   20 mg at 05/07/17 0905  . bisacodyl (DULCOLAX) suppository 10 mg  10 mg Rectal Daily [name], MD   10 mg at 05/07/17 0905  . chlorproMAZINE (THORAZINE) 25 mg in sodium chloride 0.9 % 25 mL IVPB  25 mg Intravenous Q6H PRN [name], MD      . diphenhydrAMINE (BENADRYL) 12.5 MG/5ML elixir 12.5 mg  12.5 mg Oral Q6H PRN [name], MD       Or  . diphenhydrAMINE (BENADRYL) injection 12.5 mg  12.5 mg Intravenous Q6H PRN [name], MD      . enoxaparin (LOVENOX) injection 40 mg  40 mg Subcutaneous Q24H [name], MD   40 mg at 05/08/17 0847  . gabapentin (NEURONTIN) capsule 300 mg  300 mg Oral Ardeen Fillers, MD   300 mg at 05/07/17 2132  . guaiFENesin-dextromethorphan (ROBITUSSIN DM) 100-10 MG/5ML syrup 10 mL  10 mL Oral Q4H PRN [name], MD      . hydrALAZINE (APRESOLINE) injection 5-20 mg  5-20 mg Intravenous Q4H PRN [name], MD      . hydrocortisone (ANUSOL-HC) 2.5 % rectal cream 1 application  1 application Topical QID PRN [name], MD      . hydrocortisone cream 1 % 1 application  1 application Topical TID PRN [name], MD      . HYDROmorphone (DILAUDID) injection 0.5-2 mg  0.5-2 mg  Intravenous Q2H PRN [name], MD   2 mg at 05/07/17 0025  . lactated ringers bolus 1,000 mL  1,000 mL Intravenous Q8H PRN [name], MD      . lip balm (CARMEX) ointment 1 application  1 application Topical BID [name], MD   1 application at 01/09/75 2133  . loratadine (CLARITIN) tablet 10 mg  10 mg Oral Daily [name], MD   10 mg at 05/07/17 0905  . magic mouthwash  15 mL Oral QID PRN [name], MD   15 mL at 05/07/17 0337  . [START ON 05/09/2017] magnesium oxide (MAG-OX) tablet 400 mg  400 mg Oral Once per day on Mon Thu Thomas Rhude, MD      . menthol-cetylpyridinium (CEPACOL) lozenge 3 mg  1 lozenge Oral PRN [name], MD      . methocarbamol (ROBAXIN) 1,000 mg in dextrose 5 % 50 mL IVPB  1,000 mg Intravenous Q6H PRN [name], MD   Stopped at 05/07/17 2831  . methocarbamol (ROBAXIN) tablet 1,000 mg  1,000 mg Oral Q6H PRN [name], MD      . metoCLOPramide (REGLAN) injection 5-10 mg  5-10 mg Intravenous Q6H PRN [name], MD      . multivitamins with iron tablet 1 tablet  1 tablet Oral Daily [name], MD      . ondansetron (ZOFRAN-ODT) disintegrating tablet 4 mg  4 mg Oral Q6H PRN [name], MD       Or  . ondansetron (ZOFRAN) injection 4 mg  4 mg Intravenous Q6H PRN [name], MD      . pantoprazole (PROTONIX) EC tablet 40 mg  40 mg Oral Daily [name], MD   40 mg at 05/07/17 1128  . phenol (  CHLORASEPTIC) mouth spray 1-2 spray  1-2 spray Mouth/Throat PRN [name], MD      . polyethylene glycol (MIRALAX / GLYCOLAX) packet 17 g  17 g Oral Daily PRN [name], MD      . polyethylene glycol (MIRALAX / GLYCOLAX) packet 17 g  17 g Oral Daily [name], MD   17 g at 05/07/17 1128  . prochlorperazine (COMPAZINE) injection 5-10 mg  5-10 mg Intravenous Q4H PRN [name], MD      . simethicone (MYLICON) chewable tablet 40 mg  40 mg Oral Q6H PRN [name], MD      . traMADol Veatrice Bourbon) tablet 50-100 mg  50-100 mg Oral Q6H PRN  [name], MD      . zolpidem (AMBIEN) tablet 5 mg  5 mg Oral QHS PRN [name], MD   5 mg at 05/07/17 2132     Allergies  Allergen Reactions  . Lisinopril Cough    Signed: Morton Peters, M.D., F.A.C.S. Gastrointestinal and Minimally Invasive Surgery [facility] [address], [place], [place] [address] [phone] Main / Paging   05/08/2017, 9:56 AM

## 2017-05-08 NOTE — Progress Notes (Signed)
[address] [place]oz[place], [place] [address] Phone: [phone]  FAX: [phone]      [name] [id number] Oct 24, 1950  CARE TEAM:  PCP: [name], NP  Outpatient Care Team: Patient Care Team: [name], NP as PCP - General (Internal Medicine) [name], MD as Consulting Physician (General Surgery) Armbruster, Carlota Raspberry, MD as Consulting Physician (Gastroenterology)  Inpatient Treatment Team: Treatment Team: Attending Provider: [name], MD; Registered Nurse: Neil Crouch D, RN; Student Nurse: McGugan, Jesicia C, Student-RN; Technician: Etheleen Sia, NT; Registered Nurse: Dolores Patty, RN; Registered Nurse: Rossie Muskrat, RN; Technician: Janie Morning, NT   Problem List:   Principal Problem:   Chronic prepyloric ulcer with gastric outlet obstruction s/p distal gastrectomy 05/04/2017 Active Problems:   Essential hypertension   GERD (gastroesophageal reflux disease)   Gastric outlet obstruction   History of Billroth II gastrojejunostomy reconstruction 05/04/2017   History of truncal vagotomy for chronic ulcer disease 05/04/2017   4 Days Post-Op  05/04/2017   POST-OPERATIVE DIAGNOSIS:  Chronic pyloric ulcer with gastric outlet obstruction  PROCEDURE:   XI ROBOTIC DISTAL GASTRECTOMY TRUNCAL VAGOTOMY GASTROJEJUNOSTOMY (BILLROTH II RECONSTRUCTION) OMENTOPEXY of duodenal bulb  SURGEON:  [name], MD   Assessment  Recovering  Plan:  -adv solid diet -educate anti-dumping syndrome diet.  Ask dietary/ nutrition to help with diet education. -IVF PRN backup -HTN - PRN backup for now -GERD - lower PPI daily only status post gastric resection. -Pathology - Benign stricture.  Printout of the pathology report left in room -VTE prophylaxis- SCDs, etc -mobilize as tolerated to help recovery  D/C patient from hospital when patient meets criteria (anticipate in 0-1  day(s)):  Tolerating oral intake well Ambulating well Adequate pain control without IV medications Urinating  Having flatus Disposition planning in place   I updated the patient's status to the patient.  Recommendations were made.  Questions were answered.  She expressed understanding & appreciation.  20 minutes spent in review, evaluation, examination, counseling, and coordination of care.  More than 50% of that time was spent in counseling.  [name], M.D., F.A.C.S. Gastrointestinal and Minimally Invasive Surgery [facility] [address] [place], [place] [address] [phone] Main / Paging   05/08/2017    Subjective: (Chief complaint)  Tolerating full liquids without nausea. Does not feel distended or bloated.   Walking in hallways. Wanting to go home  Objective:  Vital signs:  Vitals:   05/07/17 0551 05/07/17 1300 05/07/17 2139 05/08/17 0601  BP: (!) 126/58 115/60 128/66 113/61  Pulse: 83 81 72 79  Resp: 16 16 16 16   Temp: 99 F (37.2 C) 97.7 F (36.5 C) 98.7 F (37.1 C) 99.1 F (37.3 C)  TempSrc: Oral Oral Oral Oral  SpO2: 96% 98% 96% 96%  Weight:      Height:        Last BM Date: 05/06/17  Intake/Output   Yesterday:  10/20 0701 - 10/21 0700 In: 1370 [P.O.:1370] Out: 1850 [Urine:1850] This shift:  No intake/output data recorded.  Bowel function:  Flatus: YES  BM:  YES  Drain: n/a  Physical Exam:  General: Pt awake/alert/oriented x4 in no acute distress Eyes: PERRL, normal EOM.  Sclera clear.  No icterus Neuro: CN II-XII intact w/o focal sensory/motor deficits. Lymph: No head/neck/groin lymphadenopathy Psych:  No delerium/psychosis/paranoia HENT: Normocephalic, Mucus membranes moist.  No thrush.  NGT in place Neck: Supple, No tracheal  deviation Chest: No chest wall pain w good excursion CV:  Pulses intact.  Regular rhythm MS: Normal AROM mjr joints.  No obvious deformity  Abdomen: Soft.   Mildy distended.  Nontender.  No evidence of peritonitis.  No incarcerated hernias.  Ext:  No deformity.  No mjr edema.  No cyanosis Skin: No petechiae / purpura  Results:   Labs: No results found for this or any previous visit (from the past 48 hour(s)).  Imaging / Studies: No results found.  Medications / Allergies: per chart  Antibiotics: Anti-infectives    Start     Dose/Rate Route Frequency Ordered Stop   05/04/17 2200  cefoTEtan (CEFOTAN) 2 g in dextrose 5 % 50 mL IVPB     2 g 100 mL/hr over 30 Minutes Intravenous Every 12 hours 05/04/17 1803 05/04/17 2222   05/04/17 1028  cefTRIAXone (ROCEPHIN) 2 g in dextrose 5 % 50 mL IVPB     2 g 100 mL/hr over 30 Minutes Intravenous On call to O.R. 05/04/17 1028 05/04/17 1336   05/04/17 1028  metroNIDAZOLE (FLAGYL) IVPB 500 mg     500 mg 100 mL/hr over 60 Minutes Intravenous On call to O.R. 05/04/17 1028 05/04/17 1415        Note: Portions of this report may have been transcribed using voice recognition software. Every effort was made to ensure accuracy; however, inadvertent computerized transcription errors may be present.   Any transcriptional errors that result from this process are unintentional.     [name], M.D., F.A.C.S. Gastrointestinal and Minimally Invasive Surgery [facility] [address], [place], [place] [address] [phone] Main / Paging   05/08/2017

## 2017-05-17 ENCOUNTER — Other Ambulatory Visit: Payer: Self-pay

## 2017-05-17 MED ORDER — AMLODIPINE BESYLATE 10 MG PO TABS: 10.0000 mg | ORAL_TABLET | Freq: Every day | ORAL | 0 refills | Status: DC

## 2017-05-23 ENCOUNTER — Ambulatory Visit: Payer: Self-pay | Admitting: Neurology

## 2017-07-08 ENCOUNTER — Encounter: Payer: Self-pay | Admitting: Neurology

## 2017-07-08 ENCOUNTER — Ambulatory Visit (INDEPENDENT_AMBULATORY_CARE_PROVIDER_SITE_OTHER): Payer: Medicare Other | Admitting: Neurology

## 2017-07-08 ENCOUNTER — Other Ambulatory Visit: Payer: Self-pay

## 2017-07-08 DIAGNOSIS — M25512 Pain in left shoulder: Secondary | ICD-10-CM | POA: Insufficient documentation

## 2017-07-08 DIAGNOSIS — M542 Cervicalgia: Secondary | ICD-10-CM

## 2017-07-08 DIAGNOSIS — G44209 Tension-type headache, unspecified, not intractable: Secondary | ICD-10-CM | POA: Insufficient documentation

## 2017-07-08 DIAGNOSIS — G44219 Episodic tension-type headache, not intractable: Secondary | ICD-10-CM | POA: Diagnosis not present

## 2017-07-08 DIAGNOSIS — G43009 Migraine without aura, not intractable, without status migrainosus: Secondary | ICD-10-CM | POA: Insufficient documentation

## 2017-07-08 MED ORDER — SUMATRIPTAN SUCCINATE 100 MG PO TABS: 100.0000 mg | ORAL_TABLET | Freq: Once | ORAL | 5 refills | Status: DC | PRN

## 2017-07-08 MED ORDER — BUTALBITAL-APAP-CAFFEINE 50-325-40 MG PO TABS: ORAL_TABLET | ORAL | 5 refills | Status: DC

## 2017-07-08 MED ORDER — IMIPRAMINE HCL 25 MG PO TABS: 25.0000 mg | ORAL_TABLET | Freq: Every day | ORAL | 11 refills | Status: DC

## 2017-07-08 NOTE — Progress Notes (Addendum)
[facility]  PATIENT: Teresa Lozano DOB: 11-06-1950  REFERRING DOCTOR OR PCP:  Webb Silversmith SOURCE: patient, notes from PCP  _________________________________   HISTORICAL  CHIEF COMPLAINT:  Chief Complaint  Patient presents with  . Headache    Former pt. of Dr. Garth Bigness from [facility], would like to reestablish care for tx. of migraines.  Around 12 h/a days per month. Inderal and Imipramine, Imitrex inj, Hydrocodone. all helped in the past, but she had to stop rx. meds when she lost insurance. Sts stopped otc Tylenol due to elevated liver enzymes, and decreasecd Excedrin due to peptic ulcer./fim  . Neck Pain    HISTORY OF PRESENT ILLNESS:  Had the pleasure seeing you patient, Teresa Lozano, [facility] for neurologic consultation regarding her frequent migraine headaches.  She is a 66 year old woman who has had headaches for 40 years.   She is currently having 9-12 headaches a month, tension and migraine.     Her tension type HA's are mostly in her neck.  With these headaches, pain is achy and more constant  She lays on a heating pad with benefit.     Moving increases the pain.    She takes Excedrin when a HA is bad.   Due to a severe ulcer (needed 40% stomach removed), she is advised not to take any NSAID.   She also has migraines 4-5  a month with pounding pain behind her eye, nausea, photophobia and phonophobia.   Moving worsens the headaches.   Excedrin sometimes helps.   She has not taken a triptan in over 5 years and used to get some benefit with Imitrex.  I last saw her 5 years ago at a different practice.   After that she lost insurance and stopped her medications.   She had some benefit from a combination of a beta blocker, imipramine and Imitrex and Fioricet for breakthrough   Additionally, for the past 45 months she has had a lot more pain in the left shoulder. Pain is worse with external rotation and elevation. Pain is  also worse if she lays on that side. Slightly helps. She denies any weakness in the arms or legs.  Medication history:    She had some benefit for her migraines with imipramine and Inderal.   She has not tried Topiramate.    Medical issues:   History of gastric surgery for ulcer and cervical fusion                                                                                                                                                                             REVIEW OF SYSTEMS: Constitutional: No fevers, chills, sweats, or change in  appetite Eyes: No visual changes, double vision, eye pain Ear, nose and throat: No hearing loss, ear pain, nasal congestion, sore throat Cardiovascular: No chest pain, palpitations Respiratory: No shortness of breath at rest or with exertion.   No wheezes GastrointestinaI: No nausea, vomiting, diarrhea, abdominal pain, fecal incontinence Genitourinary: No dysuria, urinary retention or frequency.  No nocturia. Musculoskeletal: No neck pain, back pain Integumentary: No rash, pruritus, skin lesions Neurological: as above Psychiatric: No depression at this time.  No anxiety Endocrine: No palpitations, diaphoresis, change in appetite, change in weigh or increased thirst Hematologic/Lymphatic: No anemia, purpura, petechiae. Allergic/Immunologic: No itchy/runny eyes, nasal congestion, recent allergic reactions, rashes  ALLERGIES: Allergies  Allergen Reactions  . Lisinopril Cough    HOME MEDICATIONS:  Current Outpatient Medications:  .  amLODipine (NORVASC) 10 MG tablet, Take 1 tablet (10 mg total) by mouth daily., Disp: 90 tablet, Rfl: 0 .  atorvastatin (LIPITOR) 20 MG tablet, Take 1 tablet (20 mg total) by mouth daily., Disp: 30 tablet, Rfl: 2 .  cetirizine (ZYRTEC) 10 MG tablet, Take 10 mg by mouth daily., Disp: , Rfl:  .  Magnesium 500 MG CAPS, Take 500 mg by mouth 2 (two) times a week. , Disp: , Rfl:  .  omeprazole (PRILOSEC) 40 MG capsule,  Take 1 capsule (40 mg total) by mouth daily., Disp: 60 capsule, Rfl: 5 .  polyethylene glycol (MIRALAX / GLYCOLAX) packet, Take 17 g by mouth daily., Disp: , Rfl:  .  acetaminophen (TYLENOL) 650 MG CR tablet, Take 1,300 mg by mouth 2 (two) times daily., Disp: , Rfl:  .  ondansetron (ZOFRAN) 4 MG tablet, Take 1 tablet (4 mg total) by mouth every 8 (eight) hours as needed. (Patient taking differently: Take 4 mg by mouth every 8 (eight) hours as needed for nausea or vomiting. ), Disp: 20 tablet, Rfl: 0 .  traMADol (ULTRAM) 50 MG tablet, Take 1-2 tablets (50-100 mg total) by mouth every 6 (six) hours as needed for moderate pain or severe pain., Disp: 30 tablet, Rfl: 0  PAST MEDICAL HISTORY: Past Medical History:  Diagnosis Date  . Allergy   . Arthritis   . Chicken pox   . Frequent headaches   . GERD (gastroesophageal reflux disease)   . History of kidney stones   . Hyperlipidemia   . Hypertension   . Osteoporosis     PAST SURGICAL HISTORY: Past Surgical History:  Procedure Laterality Date  . CERVICAL FUSION  2000  . ESOPHAGOGASTRODUODENOSCOPY  11/2016  . ESOPHAGOGASTRODUODENOSCOPY (EGD) WITH PROPOFOL N/A 12/28/2016   Procedure: ESOPHAGOGASTRODUODENOSCOPY (EGD) WITH PROPOFOL;  Surgeon: Manus Gunning, MD;  Location: [facility];  Service: Gastroenterology;  Laterality: N/A;  . EYE SURGERY    . TONSILLECTOMY      FAMILY HISTORY: Family History  Problem Relation Age of Onset  . Hyperlipidemia Mother   . Hypertension Mother   . Prostate cancer Father   . Hyperlipidemia Father   . Heart disease Father   . Heart disease Brother   . Hypertension Sister   . Hypertension Maternal Grandmother   . Colon cancer Neg Hx     SOCIAL HISTORY:  Social History   Socioeconomic History  . Marital status: Widowed    Spouse name: Not on file  . Number of children: Not on file  . Years of education: Not on file  . Highest education level: Not on file  Social Needs  . Financial  resource strain: Not on file  . Food insecurity -  worry: Not on file  . Food insecurity - inability: Not on file  . Transportation needs - medical: Not on file  . Transportation needs - non-medical: Not on file  Occupational History  . Not on file  Tobacco Use  . Smoking status: Never Smoker  . Smokeless tobacco: Never Used  Substance and Sexual Activity  . Alcohol use: No    Alcohol/week: 0.0 oz  . Drug use: No  . Sexual activity: No  Other Topics Concern  . Not on file  Social History Narrative  . Not on file     PHYSICAL EXAM  There were no vitals filed for this visit.  There is no height or weight on file to calculate BMI.   General: The patient is well-developed and well-nourished and in no acute distress  Eyes:  Funduscopic exam shows normal optic discs and retinal vessels.  Neck: The neck is supple, no carotid bruits are noted.  The neck is tender, left > right, especially occiput  Cardiovascular: The heart has a regular rate and rhythm with a normal S1 and S2. There were no murmurs, gallops or rubs.    Skin: Extremities are without significant edema.  Musculoskeletal:  Back is nontender.    Left subacromial bursa tenderness.  Neurologic Exam  Mental status: The patient is alert and oriented x 3 at the time of the examination. The patient has apparent normal recent and remote memory, with an apparently normal attention span and concentration ability.   Speech is normal.  Cranial nerves: Extraocular movements are full. Pupils are equal, round, and reactive to light and accomodation.  Visual fields are full.  Facial symmetry is present. There is good facial sensation to soft touch bilaterally.Facial strength is normal.  Trapezius and sternocleidomastoid strength is normal. No dysarthria is noted.  The tongue is midline, and the patient has symmetric elevation of the soft palate. No obvious hearing deficits are noted.  Motor:  Muscle bulk is normal.   Tone is  normal. Strength is  5 / 5 in all 4 extremities.   Sensory: Sensory testing is intact to pinprick, soft touch and vibration sensation in all 4 extremities.  Coordination: Cerebellar testing reveals good finger-nose-finger and heel-to-shin bilaterally.  Gait and station: Station is normal.   Gait is normal. Tandem gait is normal. Romberg is negative.   Reflexes: Deep tendon reflexes are symmetric and normal bilaterally.   Plantar responses are flexor.    DIAGNOSTIC DATA (LABS, IMAGING, TESTING) - I reviewed patient records, labs, notes, testing and imaging myself where available.  Lab Results  Component Value Date   WBC 4.9 04/26/2017   HGB 14.0 04/26/2017   HCT 43.2 04/26/2017   MCV 99.6 04/26/2017   PLT 308.0 04/26/2017      Component Value Date/Time   NA 141 04/26/2017 0859   K 4.5 04/26/2017 0859   CL 100 04/26/2017 0859   CO2 30 04/26/2017 0859   GLUCOSE 103 (H) 04/26/2017 0859   BUN 19 04/26/2017 0859   CREATININE 1.03 04/26/2017 0859   CALCIUM 10.0 04/26/2017 0859   PROT 7.7 04/26/2017 0859   ALBUMIN 4.4 04/26/2017 0859   AST 64 (H) 04/26/2017 0859   ALT 57 (H) 04/26/2017 0859   ALKPHOS 118 (H) 04/26/2017 0859   BILITOT 0.4 04/26/2017 0859   GFRNONAA 49 (L) 12/17/2009 0855   GFRAA (L) 12/17/2009 0855    59        The eGFR has Lozano calculated using the MDRD equation.  This calculation has not Lozano validated in all clinical situations. eGFR's persistently <60 mL/min signify possible Chronic Kidney Disease.   Lab Results  Component Value Date   CHOL 255 (H) 04/26/2017   HDL 49.20 04/26/2017   LDLCALC 170 (H) 04/26/2017   TRIG 178.0 (H) 04/26/2017   CHOLHDL 5 04/26/2017   No results found for: HGBA1C No results found for: VITAMINB12 No results found for: TSH     ASSESSMENT AND PLAN  Common migraine without intractability  Neck pain  Left shoulder pain, unspecified chronicity    1.    Imipramine 25-50 mg for headache prophylaxis (migraine  and tension type) 2.    Imitrex prn migraine and Fioricet prn tension type - no more than 10 / month 3.    Using sterile technique, the left subacromial bursa was injected with 40 mg Depo-Medrol in 2.5 mL Marcaine. She tolerated the procedure well and there were no complications. 4.    RTC 4 months   Richard A. Felecia Shelling, MD, [facility] 94/80/1655, 3:74 AM Certified in Neurology, Clinical Neurophysiology, Sleep Medicine, Pain Medicine and Neuroimaging  [facility] [address] [place], [place] [address] [phone]

## 2017-08-02 ENCOUNTER — Other Ambulatory Visit: Payer: Self-pay | Admitting: *Deleted

## 2017-08-02 MED ORDER — IMIPRAMINE HCL 25 MG PO TABS: 25.0000 mg | ORAL_TABLET | Freq: Every day | ORAL | 3 refills | Status: DC

## 2017-08-05 ENCOUNTER — Telehealth: Payer: Self-pay | Admitting: Neurology

## 2017-08-05 DIAGNOSIS — R1013 Epigastric pain: Secondary | ICD-10-CM

## 2017-08-05 MED ORDER — ONDANSETRON HCL 4 MG PO TABS
4.0000 mg | ORAL_TABLET | Freq: Three times a day (TID) | ORAL | 0 refills | Status: DC | PRN
Start: 2017-08-05 — End: 2017-10-06

## 2017-08-05 NOTE — Telephone Encounter (Signed)
Spoke with Teresa Lozano this morning.  She is alert, oriented to person, place, time, situation, answers questions appropriately.  Sts. has some nausea with migraine and requests med [facility].  She has had Zofran in the past and sts. it worked well.  [facility] escribed to [facility]

## 2017-08-05 NOTE — Telephone Encounter (Signed)
Pt is having a migraine and is nauseated, she is wanting to know if a RX could be sent to [facility]. Please call to advise

## 2017-08-15 ENCOUNTER — Other Ambulatory Visit: Payer: Self-pay | Admitting: Internal Medicine

## 2017-08-22 ENCOUNTER — Other Ambulatory Visit: Payer: Self-pay | Admitting: Internal Medicine

## 2017-08-26 ENCOUNTER — Other Ambulatory Visit (INDEPENDENT_AMBULATORY_CARE_PROVIDER_SITE_OTHER): Payer: Medicare Other

## 2017-08-26 DIAGNOSIS — E7800 Pure hypercholesterolemia, unspecified: Secondary | ICD-10-CM | POA: Diagnosis not present

## 2017-08-26 LAB — COMPREHENSIVE METABOLIC PANEL
ALBUMIN: 3.9 g/dL (ref 3.5–5.2)
ALK PHOS: 89 U/L (ref 39–117)
ALT: 21 U/L (ref 0–35)
AST: 23 U/L (ref 0–37)
BUN: 17 mg/dL (ref 6–23)
CALCIUM: 9.3 mg/dL (ref 8.4–10.5)
CO2: 27 mEq/L (ref 19–32)
Chloride: 106 mEq/L (ref 96–112)
Creatinine, Ser: 1.05 mg/dL (ref 0.40–1.20)
GFR: 55.84 mL/min — ABNORMAL LOW (ref >60.00)
Glucose, Bld: 109 mg/dL — ABNORMAL HIGH (ref 70–99)
POTASSIUM: 4.6 meq/L (ref 3.5–5.1)
SODIUM: 139 meq/L (ref 135–145)
TOTAL PROTEIN: 6.6 g/dL (ref 6.0–8.3)
Total Bilirubin: 0.2 mg/dL (ref 0.2–1.2)

## 2017-08-26 LAB — LIPID PANEL
CHOLESTEROL: 172 mg/dL (ref 0–200)
HDL: 50.3 mg/dL (ref >39.00)
LDL Cholesterol: 104 mg/dL — ABNORMAL HIGH (ref 0–99)
NonHDL: 121.46
Total CHOL/HDL Ratio: 3
Triglycerides: 89 mg/dL (ref 0.0–149.0)
VLDL: 17.8 mg/dL (ref 0.0–40.0)

## 2017-09-12 ENCOUNTER — Telehealth: Payer: Self-pay | Admitting: Neurology

## 2017-09-12 DIAGNOSIS — M25512 Pain in left shoulder: Secondary | ICD-10-CM

## 2017-09-12 NOTE — Addendum Note (Signed)
Addended by: France Ravens I on: 09/12/2017 09:15 AM   Modules accepted: Orders

## 2017-09-12 NOTE — Telephone Encounter (Signed)
Pt states Dr Felecia Shelling gave her a shot in her shoulder for her Bursitis and she is still having extreme pain.  Pt would like to know if Dr Felecia Shelling would see her for that or if he would recommend she see someone else about this,  Please call

## 2017-09-12 NOTE — Telephone Encounter (Signed)
Spoke with Arbie Cookey who sts. left shoulder pain only helped a little by inj. in December.  Ortho referral offered and she is agreeable.  Referral order placed in Epic/fim

## 2017-09-15 DIAGNOSIS — M7542 Impingement syndrome of left shoulder: Secondary | ICD-10-CM | POA: Diagnosis not present

## 2017-09-19 ENCOUNTER — Telehealth: Payer: Self-pay | Admitting: Neurology

## 2017-09-19 DIAGNOSIS — M7542 Impingement syndrome of left shoulder: Secondary | ICD-10-CM | POA: Diagnosis not present

## 2017-09-19 DIAGNOSIS — M25512 Pain in left shoulder: Secondary | ICD-10-CM | POA: Diagnosis not present

## 2017-09-19 NOTE — Telephone Encounter (Signed)
Spoke with Arbie Cookey.  She sts. no relief of h/a with Imitrex, Fioricet, Imipramine.  Believes h/a's are coming from arm/neck pain. Sts. Tylenol products tend not to help and ASA products upset her stomach.  I have explained she will need an appt. with RAS to discuss.  I have cancelled her pending April appt. and moved it to 10/06/17/fim

## 2017-09-19 NOTE — Telephone Encounter (Signed)
Patient is taking butalbital-acetaminophen-caffeine (FIORICET, ESGIC) 50-325-40 MG tablet and SUMAtriptan (IMITREX) 100 MG tablet for migraines and is not helping. [facility] [place]?

## 2017-09-22 ENCOUNTER — Other Ambulatory Visit: Payer: Self-pay | Admitting: Internal Medicine

## 2017-09-23 DIAGNOSIS — M7542 Impingement syndrome of left shoulder: Secondary | ICD-10-CM | POA: Diagnosis not present

## 2017-09-23 DIAGNOSIS — M25512 Pain in left shoulder: Secondary | ICD-10-CM | POA: Diagnosis not present

## 2017-09-27 DIAGNOSIS — M7542 Impingement syndrome of left shoulder: Secondary | ICD-10-CM | POA: Diagnosis not present

## 2017-09-27 DIAGNOSIS — M25512 Pain in left shoulder: Secondary | ICD-10-CM | POA: Diagnosis not present

## 2017-09-28 ENCOUNTER — Other Ambulatory Visit: Payer: Self-pay

## 2017-09-28 MED ORDER — ATORVASTATIN CALCIUM 20 MG PO TABS: 20.0000 mg | ORAL_TABLET | Freq: Every day | ORAL | 1 refills | Status: DC

## 2017-09-30 DIAGNOSIS — M25512 Pain in left shoulder: Secondary | ICD-10-CM | POA: Diagnosis not present

## 2017-09-30 DIAGNOSIS — M7542 Impingement syndrome of left shoulder: Secondary | ICD-10-CM | POA: Diagnosis not present

## 2017-10-03 DIAGNOSIS — M7542 Impingement syndrome of left shoulder: Secondary | ICD-10-CM | POA: Diagnosis not present

## 2017-10-03 DIAGNOSIS — M25512 Pain in left shoulder: Secondary | ICD-10-CM | POA: Diagnosis not present

## 2017-10-05 DIAGNOSIS — M25512 Pain in left shoulder: Secondary | ICD-10-CM | POA: Diagnosis not present

## 2017-10-05 DIAGNOSIS — M7542 Impingement syndrome of left shoulder: Secondary | ICD-10-CM | POA: Diagnosis not present

## 2017-10-06 ENCOUNTER — Encounter: Payer: Self-pay | Admitting: Neurology

## 2017-10-06 ENCOUNTER — Other Ambulatory Visit: Payer: Self-pay

## 2017-10-06 ENCOUNTER — Ambulatory Visit (INDEPENDENT_AMBULATORY_CARE_PROVIDER_SITE_OTHER): Payer: Medicare Other | Admitting: Neurology

## 2017-10-06 VITALS — BP 133/81 | HR 103 | Resp 16 | Ht 62.0 in | Wt 116.0 lb

## 2017-10-06 DIAGNOSIS — M25512 Pain in left shoulder: Secondary | ICD-10-CM

## 2017-10-06 DIAGNOSIS — G43009 Migraine without aura, not intractable, without status migrainosus: Secondary | ICD-10-CM | POA: Diagnosis not present

## 2017-10-06 DIAGNOSIS — G44219 Episodic tension-type headache, not intractable: Secondary | ICD-10-CM | POA: Diagnosis not present

## 2017-10-06 DIAGNOSIS — M542 Cervicalgia: Secondary | ICD-10-CM

## 2017-10-06 DIAGNOSIS — R1013 Epigastric pain: Secondary | ICD-10-CM

## 2017-10-06 MED ORDER — ONDANSETRON HCL 4 MG PO TABS: 4.0000 mg | ORAL_TABLET | Freq: Three times a day (TID) | ORAL | 5 refills | Status: DC | PRN

## 2017-10-06 MED ORDER — BUTALBITAL-APAP-CAFFEINE 50-325-40 MG PO TABS: ORAL_TABLET | ORAL | 5 refills | Status: DC

## 2017-10-06 MED ORDER — IMIPRAMINE HCL 25 MG PO TABS: 75.0000 mg | ORAL_TABLET | Freq: Every day | ORAL | 3 refills | Status: DC

## 2017-10-06 NOTE — Progress Notes (Signed)
[facility]  PATIENT: Teresa Lozano DOB: February 18, 1951  REFERRING DOCTOR OR PCP:  Webb Silversmith SOURCE: patient, notes from PCP  _________________________________   HISTORICAL  CHIEF COMPLAINT:  Chief Complaint  Patient presents with  . Headache    Sts. h/a's are same frequency, same severity despite compliance with Imipramine.  Sts. Imitrex helps "some" when she has a h/a. Ortho is now treating shoulder pain, and she is participating in PT for same/fim  . Left Shoulder Pain    HISTORY OF PRESENT ILLNESS:  Teresa Lozano is a 67 year old woman who has had headaches for 40 years.     Update 10/06/2017: She is experiencing migraines about 2-3 days a week.    She is doing PT for her left shoulder and the therapy increases her pain.    A lot of the headaches are predominantly occipital    Many years ago, when she had daily headaches, she got some benefit from occipital nerve blocks.   She is currently in imipramine 75 mg nightly.     Imitrex sometimes helps when a HA occurs, more for the frontal HA than for the occipital ones.   Fioricet (2 pills) helps but she is limited in quantity to prevent rebound.   Due to ulcers, she can't take NSAID's.   Due to kidney stones, she can't take Topamax or Zonegran.   She gets frequent epigastric pain still.   From 07/08/2017: She is currently having 9-12 headaches a month, tension and migraine.     Her tension type HA's are mostly in her neck.  With these headaches, pain is achy and more constant  She lays on a heating pad with benefit.     Moving increases the pain.    She takes Excedrin when a HA is bad.   Due to a severe ulcer (needed 40% stomach removed), she is advised not to take any NSAID.   She also has migraines 4-5  a month with pounding pain behind her eye, nausea, photophobia and phonophobia.   Moving worsens the headaches.   Excedrin sometimes helps.   She has not taken a triptan in over 5 years and used to get some  benefit with Imitrex.  I last saw her 5 years ago at a different practice.   After that she lost insurance and stopped her medications.   She had some benefit from a combination of a beta blocker, imipramine and Imitrex and Fioricet for breakthrough   Additionally, for the past 45 months she has had a lot more pain in the left shoulder. Pain is worse with external rotation and elevation. Pain is also worse if she lays on that side. Slightly helps. She denies any weakness in the arms or legs.  Medication history:    She had some benefit for her migraines with imipramine and Inderal.   She has not tried Topiramate.    Medical issues:   History of gastric surgery for ulcer and cervical fusion  REVIEW OF SYSTEMS: Constitutional: No fevers, chills, sweats, or change in appetite Eyes: No visual changes, double vision, eye pain Ear, nose and throat: No hearing loss, ear pain, nasal congestion, sore throat Cardiovascular: No chest pain, palpitations Respiratory: No shortness of breath at rest or with exertion.   No wheezes GastrointestinaI: No nausea, vomiting, diarrhea, abdominal pain, fecal incontinence Genitourinary: No dysuria, urinary retention or frequency.  No nocturia. Musculoskeletal: No neck pain, back pain Integumentary: No rash, pruritus, skin lesions Neurological: as above Psychiatric: No depression at this time.  No anxiety Endocrine: No palpitations, diaphoresis, change in appetite, change in weigh or increased thirst Hematologic/Lymphatic: No anemia, purpura, petechiae. Allergic/Immunologic: No itchy/runny eyes, nasal congestion, recent allergic reactions, rashes  ALLERGIES: Allergies  Allergen Reactions  . Lisinopril Cough    HOME MEDICATIONS:  Current Outpatient Medications:  .  acetaminophen  (TYLENOL) 650 MG CR tablet, Take 1,300 mg by mouth 2 (two) times daily., Disp: , Rfl:  .  amLODipine (NORVASC) 10 MG tablet, TAKE 1 TABLET BY MOUTH EVERY DAY, Disp: 30 tablet, Rfl: 5 .  atorvastatin (LIPITOR) 20 MG tablet, Take 1 tablet (20 mg total) by mouth daily., Disp: 90 tablet, Rfl: 1 .  butalbital-acetaminophen-caffeine (FIORICET, ESGIC) 50-325-40 MG tablet, 1 to 2 po prn headache.   No more than 16 pills a month, Disp: 16 tablet, Rfl: 5 .  cetirizine (ZYRTEC) 10 MG tablet, Take 10 mg by mouth daily., Disp: , Rfl:  .  imipramine (TOFRANIL) 25 MG tablet, Take 3 tablets (75 mg total) by mouth at bedtime., Disp: 270 tablet, Rfl: 3 .  Magnesium 500 MG CAPS, Take 500 mg by mouth 2 (two) times a week. , Disp: , Rfl:  .  omeprazole (PRILOSEC) 40 MG capsule, Take 1 capsule (40 mg total) by mouth daily., Disp: 60 capsule, Rfl: 5 .  ondansetron (ZOFRAN) 4 MG tablet, Take 1 tablet (4 mg total) by mouth every 8 (eight) hours as needed., Disp: 20 tablet, Rfl: 5 .  polyethylene glycol (MIRALAX / GLYCOLAX) packet, Take 17 g by mouth daily., Disp: , Rfl:  .  SUMAtriptan (IMITREX) 100 MG tablet, Take 1 tablet (100 mg total) by mouth once as needed for up to 1 dose for migraine. May repeat in 2 hours if headache persists or recurs., Disp: 10 tablet, Rfl: 5  PAST MEDICAL HISTORY: Past Medical History:  Diagnosis Date  . Allergy   . Arthritis   . Chicken pox   . Frequent headaches   . GERD (gastroesophageal reflux disease)   . History of kidney stones   . Hyperlipidemia   . Hypertension   . Osteoporosis     PAST SURGICAL HISTORY: Past Surgical History:  Procedure Laterality Date  . CERVICAL FUSION  2000  . ESOPHAGOGASTRODUODENOSCOPY  11/2016  . ESOPHAGOGASTRODUODENOSCOPY (EGD) WITH PROPOFOL N/A 12/28/2016   Procedure: ESOPHAGOGASTRODUODENOSCOPY (EGD) WITH PROPOFOL;  Surgeon: Manus Gunning, MD;  Location: [facility];  Service: Gastroenterology;  Laterality: N/A;  . EYE SURGERY    .  TONSILLECTOMY      FAMILY HISTORY: Family History  Problem Relation Age of Onset  . Hyperlipidemia Mother   . Hypertension Mother   . Prostate cancer Father   . Hyperlipidemia Father   . Heart disease Father   . Heart disease Brother   . Hypertension Sister   . Hypertension Maternal Grandmother   . Colon cancer Neg Hx     SOCIAL HISTORY:  Social History   Socioeconomic History  . Marital status: Widowed  Spouse name: Not on file  . Number of children: Not on file  . Years of education: Not on file  . Highest education level: Not on file  Occupational History  . Not on file  Social Needs  . Financial resource strain: Not on file  . Food insecurity:    Worry: Not on file    Inability: Not on file  . Transportation needs:    Medical: Not on file    Non-medical: Not on file  Tobacco Use  . Smoking status: Never Smoker  . Smokeless tobacco: Never Used  Substance and Sexual Activity  . Alcohol use: No    Alcohol/week: 0.0 oz  . Drug use: No  . Sexual activity: Never  Lifestyle  . Physical activity:    Days per week: Not on file    Minutes per session: Not on file  . Stress: Not on file  Relationships  . Social connections:    Talks on phone: Not on file    Gets together: Not on file    Attends religious service: Not on file    Active member of club or organization: Not on file    Attends meetings of clubs or organizations: Not on file    Relationship status: Not on file  . Intimate partner violence:    Fear of current or ex partner: Not on file    Emotionally abused: Not on file    Physically abused: Not on file    Forced sexual activity: Not on file  Other Topics Concern  . Not on file  Social History Narrative  . Not on file     PHYSICAL EXAM  Vitals:   10/06/17 1109  BP: 133/81  Pulse: (!) 103  Resp: 16  Weight: 116 lb (52.6 kg)  Height: 5' 2" (1.575 m)    Body mass index is 21.22 kg/m.   General: The patient is well-developed and  well-nourished and in no acute distress  Musculoskeletal:  Back is nontender.    She has left subacromial bursa tenderness.   Neurologic Exam  Mental status: The patient is alert and oriented x 3 at the time of the examination. The patient has apparent normal recent and remote memory, with an apparently normal attention span and concentration ability.   Speech is normal.  Cranial nerves: Extraocular movements are full.  Facial strength and sensation is normal.  Trapezius strength is normal.  The tongue is midline, and the patient has symmetric elevation of the soft palate. No obvious hearing deficits are noted.  Motor:  Muscle bulk is normal.   Tone is normal. Strength is  5 / 5 in all 4 extremities.   Sensory: Sensory testing is intact to touch  in all 4 extremities.  Coordination: Cerebellar testing reveals good finger-nose-finger and heel-to-shin bilaterally.  Gait and station: Station is normal.   Gait is normal. Tandem gait is normal. Romberg is negative.   Reflexes: Deep tendon reflexes are symmetric and normal bilaterally.      DIAGNOSTIC DATA (LABS, IMAGING, TESTING) - I reviewed patient records, labs, notes, testing and imaging myself where available.  Lab Results  Component Value Date   WBC 4.9 04/26/2017   HGB 14.0 04/26/2017   HCT 43.2 04/26/2017   MCV 99.6 04/26/2017   PLT 308.0 04/26/2017      Component Value Date/Time   NA 139 08/26/2017 0833   K 4.6 08/26/2017 0833   CL 106 08/26/2017 0833   CO2 27 08/26/2017 6568  GLUCOSE 109 (H) 08/26/2017 0833   BUN 17 08/26/2017 0833   CREATININE 1.05 08/26/2017 0833   CALCIUM 9.3 08/26/2017 0833   PROT 6.6 08/26/2017 0833   ALBUMIN 3.9 08/26/2017 0833   AST 23 08/26/2017 0833   ALT 21 08/26/2017 0833   ALKPHOS 89 08/26/2017 0833   BILITOT 0.2 08/26/2017 0833   GFRNONAA 49 (L) 12/17/2009 0855   GFRAA (L) 12/17/2009 0855    59        The eGFR has been calculated using the MDRD equation. This calculation has not  been validated in all clinical situations. eGFR's persistently <60 mL/min signify possible Chronic Kidney Disease.   Lab Results  Component Value Date   CHOL 172 08/26/2017   HDL 50.30 08/26/2017   LDLCALC 104 (H) 08/26/2017   TRIG 89.0 08/26/2017   CHOLHDL 3 08/26/2017      ASSESSMENT AND PLAN  Common migraine without intractability  Epigastric pain - Plan: ondansetron (ZOFRAN) 4 MG tablet  Neck pain  Episodic tension-type headache, not intractable  Left shoulder pain, unspecified chronicity    1.   Increase imipramine to 75 mg for headache prophylaxis (migraine and tension type) 2.    Imitrex prn migraine and Fioricet prn tension type - no more than 16/month 3.    Using sterile technique, bilateral splenius capitus muscle injections were performed with 80 mg Depo-Medrol in 3 -17m Marcaine. She tolerated the procedure well .  During the second injection (left), she almost fainted and had a couple jerks but no seizures.   She was quickly (seconds) back to baseline.  I monitored her for 5 more minutes before discharge.   4.    RTC 4 months   Richard A. SFelecia Shelling MD, PSt [facility]30/62/3762 183:15AM Certified in Neurology, Clinical Neurophysiology, Sleep Medicine, Pain Medicine and Neuroimaging  [facility] [address] SJoppaGMagnolia [place] [phone]

## 2017-10-10 DIAGNOSIS — M7542 Impingement syndrome of left shoulder: Secondary | ICD-10-CM | POA: Diagnosis not present

## 2017-10-10 DIAGNOSIS — M25512 Pain in left shoulder: Secondary | ICD-10-CM | POA: Diagnosis not present

## 2017-10-12 DIAGNOSIS — M25512 Pain in left shoulder: Secondary | ICD-10-CM | POA: Diagnosis not present

## 2017-10-12 DIAGNOSIS — M7542 Impingement syndrome of left shoulder: Secondary | ICD-10-CM | POA: Diagnosis not present

## 2017-10-17 DIAGNOSIS — M25512 Pain in left shoulder: Secondary | ICD-10-CM | POA: Diagnosis not present

## 2017-10-17 DIAGNOSIS — M7542 Impingement syndrome of left shoulder: Secondary | ICD-10-CM | POA: Diagnosis not present

## 2017-10-27 DIAGNOSIS — M7542 Impingement syndrome of left shoulder: Secondary | ICD-10-CM | POA: Diagnosis not present

## 2017-11-07 ENCOUNTER — Ambulatory Visit: Payer: Medicare Other | Admitting: Neurology

## 2017-11-30 ENCOUNTER — Ambulatory Visit: Payer: Medicare Other | Admitting: Internal Medicine

## 2017-11-30 ENCOUNTER — Encounter: Payer: Self-pay | Admitting: Internal Medicine

## 2017-11-30 ENCOUNTER — Ambulatory Visit (INDEPENDENT_AMBULATORY_CARE_PROVIDER_SITE_OTHER): Payer: Medicare Other | Admitting: Internal Medicine

## 2017-11-30 VITALS — BP 118/84 | HR 125 | Temp 98.7°F | Resp 24 | Ht 62.0 in | Wt 109.0 lb

## 2017-11-30 DIAGNOSIS — J0100 Acute maxillary sinusitis, unspecified: Secondary | ICD-10-CM

## 2017-11-30 MED ORDER — AMOXICILLIN 500 MG PO TABS: 1000.0000 mg | ORAL_TABLET | Freq: Two times a day (BID) | ORAL | 0 refills | Status: AC

## 2017-11-30 NOTE — Patient Instructions (Signed)
Please use the nasacort twice a day--2 sprays in each nostril. If you are getting worse, or no better, in the next few days--start the antibiotic.

## 2017-11-30 NOTE — Progress Notes (Signed)
Subjective:    Patient ID: Teresa Lozano, female    DOB: [birth year], 67 y.o.   MRN: [id number]  HPI Here due to sinus symptoms Started 3-4 days ago  Have facial painand eye symptoms Gunk coming out of eyes Lots of cough---dry Maxillary pain tooth pain Sinus symptoms long ago---takes zyrtec for chronic allergies  Low grade fever last 2 nights Awoke last night in sweat No SOB  Tried tylenol and excedrin--not much help Delsym for cough nasacort intermittently  Current Outpatient Medications on File Prior to Visit  Medication Sig Dispense Refill  . acetaminophen (TYLENOL) 650 MG CR tablet Take 1,300 mg by mouth 2 (two) times daily.    Marland Kitchen amLODipine (NORVASC) 10 MG tablet TAKE 1 TABLET BY MOUTH EVERY DAY 30 tablet 5  . atorvastatin (LIPITOR) 20 MG tablet Take 1 tablet (20 mg total) by mouth daily. 90 tablet 1  . butalbital-acetaminophen-caffeine (FIORICET, ESGIC) 50-325-40 MG tablet 1 to 2 po prn headache.   No more than 16 pills a month 16 tablet 5  . cetirizine (ZYRTEC) 10 MG tablet Take 10 mg by mouth daily.    Marland Kitchen imipramine (TOFRANIL) 25 MG tablet Take 3 tablets (75 mg total) by mouth at bedtime. 270 tablet 3  . Magnesium 500 MG CAPS Take 500 mg by mouth 2 (two) times a week.     Marland Kitchen omeprazole (PRILOSEC) 40 MG capsule Take 1 capsule (40 mg total) by mouth daily. 60 capsule 5  . ondansetron (ZOFRAN) 4 MG tablet Take 1 tablet (4 mg total) by mouth every 8 (eight) hours as needed. 20 tablet 5  . polyethylene glycol (MIRALAX / GLYCOLAX) packet Take 17 g by mouth daily.    . SUMAtriptan (IMITREX) 100 MG tablet Take 1 tablet (100 mg total) by mouth once as needed for up to 1 dose for migraine. May repeat in 2 hours if headache persists or recurs. 10 tablet 5   No current facility-administered medications on file prior to visit.     Allergies  Allergen Reactions  . Lisinopril Cough    Past Medical History:  Diagnosis Date  . Allergy   . Arthritis   . Chicken pox   .  Frequent headaches   . GERD (gastroesophageal reflux disease)   . History of kidney stones   . Hyperlipidemia   . Hypertension   . Osteoporosis     Past Surgical History:  Procedure Laterality Date  . CERVICAL FUSION  2000  . ESOPHAGOGASTRODUODENOSCOPY  11/2016  . ESOPHAGOGASTRODUODENOSCOPY (EGD) WITH PROPOFOL N/A 12/28/2016   Procedure: ESOPHAGOGASTRODUODENOSCOPY (EGD) WITH PROPOFOL;  Surgeon: Manus Gunning, MD;  Location: [facility];  Service: Gastroenterology;  Laterality: N/A;  . EYE SURGERY    . TONSILLECTOMY      Family History  Problem Relation Age of Onset  . Hyperlipidemia Mother   . Hypertension Mother   . Prostate cancer Father   . Hyperlipidemia Father   . Heart disease Father   . Heart disease Brother   . Hypertension Sister   . Hypertension Maternal Grandmother   . Colon cancer Neg Hx     Social History   Socioeconomic History  . Marital status: Widowed    Spouse name: Not on file  . Number of children: Not on file  . Years of education: Not on file  . Highest education level: Not on file  Occupational History  . Not on file  Social Needs  . Financial resource strain: Not on file  .  Food insecurity:    Worry: Not on file    Inability: Not on file  . Transportation needs:    Medical: Not on file    Non-medical: Not on file  Tobacco Use  . Smoking status: Never Smoker  . Smokeless tobacco: Never Used  Substance and Sexual Activity  . Alcohol use: No    Alcohol/week: 0.0 oz  . Drug use: No  . Sexual activity: Never  Lifestyle  . Physical activity:    Days per week: Not on file    Minutes per session: Not on file  . Stress: Not on file  Relationships  . Social connections:    Talks on phone: Not on file    Gets together: Not on file    Attends religious service: Not on file    Active member of club or organization: Not on file    Attends meetings of clubs or organizations: Not on file    Relationship status: Not on file  .  Intimate partner violence:    Fear of current or ex partner: Not on file    Emotionally abused: Not on file    Physically abused: Not on file    Forced sexual activity: Not on file  Other Topics Concern  . Not on file  Social History Narrative  . Not on file   Review of Systems No rash No vomiting or diarrhea Appetite off but able to eat    Objective:   Physical Exam  Constitutional: She appears well-developed. No distress.  HENT:  Mouth/Throat: Oropharynx is clear and moist. No oropharyngeal exudate.  Marked maxillary tenderness Severe inflammation L>R nare TMs normal   Neck: No thyromegaly present.  Pulmonary/Chest: Effort normal and breath sounds normal. No respiratory distress. She has no wheezes. She has no rales.  Lymphadenopathy:    She has no cervical adenopathy.  Skin: No rash noted.          Assessment & Plan:

## 2017-11-30 NOTE — Assessment & Plan Note (Signed)
Caught from husband--explained that it is viral Restart her nasacort bid Analgesics If worsening---would recommend amoxil

## 2017-12-18 ENCOUNTER — Other Ambulatory Visit: Payer: Self-pay | Admitting: Neurology

## 2017-12-28 ENCOUNTER — Encounter: Payer: Self-pay | Admitting: Internal Medicine

## 2017-12-28 ENCOUNTER — Ambulatory Visit (INDEPENDENT_AMBULATORY_CARE_PROVIDER_SITE_OTHER): Payer: Medicare Other | Admitting: Internal Medicine

## 2017-12-28 VITALS — BP 116/72 | HR 97 | Temp 97.9°F | Wt 108.0 lb

## 2017-12-28 DIAGNOSIS — R21 Rash and other nonspecific skin eruption: Secondary | ICD-10-CM

## 2017-12-28 DIAGNOSIS — J029 Acute pharyngitis, unspecified: Secondary | ICD-10-CM | POA: Diagnosis not present

## 2017-12-28 DIAGNOSIS — G44201 Tension-type headache, unspecified, intractable: Secondary | ICD-10-CM | POA: Diagnosis not present

## 2017-12-28 DIAGNOSIS — R52 Pain, unspecified: Secondary | ICD-10-CM

## 2017-12-28 MED ORDER — METHYLPREDNISOLONE ACETATE 80 MG/ML IJ SUSP
80.0000 mg | Freq: Once | INTRAMUSCULAR | Status: AC
Start: 2017-12-28 — End: 2017-12-28
  Administered 2017-12-28: 80 mg via INTRAMUSCULAR

## 2017-12-28 NOTE — Progress Notes (Signed)
Subjective:    Patient ID: Teresa Lozano, female    DOB: June 27, 1951, 67 y.o.   MRN: [id number]  HPI  Pt presents to the clinic today with c/o headache, sore throat, body aches and rash. She reports this started 3-4 days ago. She describes the headache as aching, mostly in the back of her head. She denies dizziness or visual changes. She has some difficulty swallowing. The rash is located on her arms and face. The rash is itchy. It has not seem to spread. She denies fever, or chills, but has had body aches. She is concerned about tick borne illness because she pulled a tick off her lower abdomen 4-6 weeks ago. She has tried Tylenol and Gold Bond Ointment for the rash. She denies sick contacts.  Review of Systems      Past Medical History:  Diagnosis Date  . Allergy   . Arthritis   . Chicken pox   . Frequent headaches   . GERD (gastroesophageal reflux disease)   . History of kidney stones   . Hyperlipidemia   . Hypertension   . Osteoporosis     Current Outpatient Medications  Medication Sig Dispense Refill  . acetaminophen (TYLENOL) 650 MG CR tablet Take 1,300 mg by mouth 2 (two) times daily.    Marland Kitchen amLODipine (NORVASC) 10 MG tablet TAKE 1 TABLET BY MOUTH EVERY DAY 30 tablet 5  . atorvastatin (LIPITOR) 20 MG tablet Take 1 tablet (20 mg total) by mouth daily. 90 tablet 1  . butalbital-acetaminophen-caffeine (FIORICET, ESGIC) 50-325-40 MG tablet 1 to 2 po prn headache.   No more than 16 pills a month 16 tablet 5  . cetirizine (ZYRTEC) 10 MG tablet Take 10 mg by mouth daily.    Marland Kitchen imipramine (TOFRANIL) 25 MG tablet Take 3 tablets (75 mg total) by mouth at bedtime. 270 tablet 3  . Magnesium 500 MG CAPS Take 500 mg by mouth 2 (two) times a week.     Marland Kitchen omeprazole (PRILOSEC) 40 MG capsule Take 1 capsule (40 mg total) by mouth daily. 60 capsule 5  . ondansetron (ZOFRAN) 4 MG tablet Take 1 tablet (4 mg total) by mouth every 8 (eight) hours as needed. 20 tablet 5  . polyethylene glycol  (MIRALAX / GLYCOLAX) packet Take 17 g by mouth daily.    . SUMAtriptan (IMITREX) 100 MG tablet TAKE 1 TAB BY MOUTH AS NEEDED FOR MIGRAINE. MAY REPEAT IN 2 HRS IF HEADACHE PERSISTS OR RECURS. 10 tablet 5   No current facility-administered medications for this visit.     Allergies  Allergen Reactions  . Lisinopril Cough    Family History  Problem Relation Age of Onset  . Hyperlipidemia Mother   . Hypertension Mother   . Prostate cancer Father   . Hyperlipidemia Father   . Heart disease Father   . Heart disease Brother   . Hypertension Sister   . Hypertension Maternal Grandmother   . Colon cancer Neg Hx     Social History   Socioeconomic History  . Marital status: Widowed    Spouse name: Not on file  . Number of children: Not on file  . Years of education: Not on file  . Highest education level: Not on file  Occupational History  . Not on file  Social Needs  . Financial resource strain: Not on file  . Food insecurity:    Worry: Not on file    Inability: Not on file  . Transportation needs:  Medical: Not on file    Non-medical: Not on file  Tobacco Use  . Smoking status: Never Smoker  . Smokeless tobacco: Never Used  Substance and Sexual Activity  . Alcohol use: No    Alcohol/week: 0.0 oz  . Drug use: No  . Sexual activity: Never  Lifestyle  . Physical activity:    Days per week: Not on file    Minutes per session: Not on file  . Stress: Not on file  Relationships  . Social connections:    Talks on phone: Not on file    Gets together: Not on file    Attends religious service: Not on file    Active member of club or organization: Not on file    Attends meetings of clubs or organizations: Not on file    Relationship status: Not on file  . Intimate partner violence:    Fear of current or ex partner: Not on file    Emotionally abused: Not on file    Physically abused: Not on file    Forced sexual activity: Not on file  Other Topics Concern  . Not on file   Social History Narrative  . Not on file     Constitutional: Pt reports headache. Denies fever, malaise, fatigue, or abrupt weight changes.  HEENT: Pt reports sore throat. Denies eye pain, eye redness, ear pain, ringing in the ears, wax buildup, runny nose, nasal congestion, bloody nose. Respiratory: Denies difficulty breathing, shortness of breath, cough or sputum production.   Cardiovascular: Denies chest pain, chest tightness, palpitations or swelling in the hands or feet.  Gastrointestinal: Denies abdominal pain, bloating, constipation, diarrhea or blood in the stool.  Musculoskeletal: Pt reports body aches. Denies decrease in range of motion, difficulty with gait, or joint pain and swelling.  Skin: Pt reports rash. Denies ulcercations.  Neurological: Denies dizziness, difficulty with memory, difficulty with speech or problems with balance and coordination.  SI/HI.  No other specific complaints in a complete review of systems (except as listed in HPI above).  Objective:   Physical Exam  BP 116/72   Pulse 97   Temp 97.9 F (36.6 C) (Oral)   Wt 108 lb (49 kg)   SpO2 98%   BMI 19.75 kg/m  Wt Readings from Last 3 Encounters:  12/28/17 108 lb (49 kg)  11/30/17 109 lb (49.4 kg)  10/06/17 116 lb (52.6 kg)    General: Appears her stated age, in NAD. Skin: Warm, dry and intact. Grouped vesicular lesions noted on left upper arm. HEENT: Head: normal shape and size, no sinus tenderness noted; Ears: Tm's gray and intact, normal light reflex; Throat/Mouth: Teeth present, mucosa pink and moist, + PND, no exudate, lesions or ulcerations noted.  Neck:  No adenopathy noted. Cardiovascular: Normal rate and rhythm. S1,S2 noted.  No murmur, rubs or gallops noted.  Pulmonary/Chest: Normal effort and positive vesicular breath sounds. No respiratory distress. No wheezes, rales or ronchi noted.  Musculoskeletal:  No difficulty with gait.  Neurological: Alert and oriented.   BMET    Component  Value Date/Time   NA 139 08/26/2017 0833   K 4.6 08/26/2017 0833   CL 106 08/26/2017 0833   CO2 27 08/26/2017 0833   GLUCOSE 109 (H) 08/26/2017 0833   BUN 17 08/26/2017 0833   CREATININE 1.05 08/26/2017 0833   CALCIUM 9.3 08/26/2017 0833   GFRNONAA 49 (L) 12/17/2009 0855   GFRAA (L) 12/17/2009 0855    59  The eGFR has been calculated using the MDRD equation. This calculation has not been validated in all clinical situations. eGFR's persistently <60 mL/min signify possible Chronic Kidney Disease.    Lipid Panel     Component Value Date/Time   CHOL 172 08/26/2017 0833   TRIG 89.0 08/26/2017 0833   HDL 50.30 08/26/2017 0833   CHOLHDL 3 08/26/2017 0833   VLDL 17.8 08/26/2017 0833   LDLCALC 104 (H) 08/26/2017 0833    CBC    Component Value Date/Time   WBC 4.9 04/26/2017 0859   RBC 4.34 04/26/2017 0859   HGB 14.0 04/26/2017 0859   HCT 43.2 04/26/2017 0859   PLT 308.0 04/26/2017 0859   MCV 99.6 04/26/2017 0859   MCHC 32.5 04/26/2017 0859   RDW 13.5 04/26/2017 0859   LYMPHSABS 2.3 12/17/2009 0855   MONOABS 0.7 12/17/2009 0855   EOSABS 0.2 12/17/2009 0855   BASOSABS 0.0 12/17/2009 0855    Hgb A1C No results found for: HGBA1C          Assessment & Plan:   Headache, Sore Throat, Body Aches:  Allergies vs viral Continue daily Zyrtec  80 mg Depo IM today  Rash:  Appears to be a contact dermatitis 80 mg Depo IM today Hydrocortisone OTC prn  If symptoms persist, consider work up for tick born illness Webb Silversmith, NP

## 2017-12-28 NOTE — Patient Instructions (Signed)

## 2017-12-28 NOTE — Addendum Note (Signed)
Addended by: Lurlean Nanny on: 12/28/2017 04:51 PM   Modules accepted: Orders

## 2018-01-11 DIAGNOSIS — H5213 Myopia, bilateral: Secondary | ICD-10-CM | POA: Diagnosis not present

## 2018-01-11 DIAGNOSIS — H52223 Regular astigmatism, bilateral: Secondary | ICD-10-CM | POA: Diagnosis not present

## 2018-01-11 DIAGNOSIS — H1045 Other chronic allergic conjunctivitis: Secondary | ICD-10-CM | POA: Diagnosis not present

## 2018-01-11 DIAGNOSIS — H524 Presbyopia: Secondary | ICD-10-CM | POA: Diagnosis not present

## 2018-01-24 ENCOUNTER — Other Ambulatory Visit: Payer: Self-pay | Admitting: Internal Medicine

## 2018-02-23 ENCOUNTER — Other Ambulatory Visit: Payer: Self-pay | Admitting: Neurology

## 2018-02-23 ENCOUNTER — Telehealth: Payer: Self-pay | Admitting: Neurology

## 2018-02-23 NOTE — Telephone Encounter (Signed)
Spoke with Teresa Lozano. Last Fioricet rx. plus 5 additional r/f was given 10/06/17. She should have r/f available at her pharmacy. She verbalized understanding of same/fim

## 2018-02-23 NOTE — Telephone Encounter (Signed)
Pt called stating she will be calling in butalbital-acetaminophen-caffeine (FIORICET, ESGIC) 50-325-40 MG tablet a little early this month due to a death in the family and having to leave town. FYI

## 2018-03-06 ENCOUNTER — Telehealth: Payer: Self-pay | Admitting: *Deleted

## 2018-03-06 MED ORDER — BUTALBITAL-APAP-CAFFEINE 50-325-40 MG PO TABS: ORAL_TABLET | ORAL | 5 refills | Status: DC

## 2018-03-06 NOTE — Telephone Encounter (Signed)
Rx. awaiting RAS sig/fim 

## 2018-03-08 ENCOUNTER — Encounter: Payer: Self-pay | Admitting: Neurology

## 2018-03-08 ENCOUNTER — Ambulatory Visit (INDEPENDENT_AMBULATORY_CARE_PROVIDER_SITE_OTHER): Payer: Medicare Other | Admitting: Neurology

## 2018-03-08 ENCOUNTER — Other Ambulatory Visit: Payer: Self-pay

## 2018-03-08 VITALS — BP 119/73 | HR 94 | Resp 16 | Ht 62.0 in | Wt 107.0 lb

## 2018-03-08 DIAGNOSIS — G44219 Episodic tension-type headache, not intractable: Secondary | ICD-10-CM | POA: Diagnosis not present

## 2018-03-08 DIAGNOSIS — M542 Cervicalgia: Secondary | ICD-10-CM | POA: Diagnosis not present

## 2018-03-08 DIAGNOSIS — G43009 Migraine without aura, not intractable, without status migrainosus: Secondary | ICD-10-CM | POA: Diagnosis not present

## 2018-03-08 MED ORDER — TIZANIDINE HCL 4 MG PO TABS: 4.0000 mg | ORAL_TABLET | Freq: Three times a day (TID) | ORAL | 5 refills | Status: DC

## 2018-03-08 MED ORDER — BUTALBITAL-APAP-CAFFEINE 50-325-40 MG PO TABS: ORAL_TABLET | ORAL | 5 refills | Status: DC

## 2018-03-08 NOTE — Progress Notes (Signed)
[facility]  PATIENT: Teresa Lozano DOB: December 26, 1950  REFERRING DOCTOR OR PCP:  Webb Silversmith SOURCE: patient, notes from PCP  _________________________________   HISTORICAL  CHIEF COMPLAINT:  Chief Complaint  Patient presents with  . Migraines    Sts. h/a's are same frequency but less severe since increasing Imipramine to '75mg'$  daily.  Sts. she often wakes with a h/a'/fim    HISTORY OF PRESENT ILLNESS:  Teresa Lozano is a 67 y.o. woman who has had headaches for 40 years.    Update 03/08/2018: She has headaches 4-5 days a week (16-20 days/month) lasting at least 4 hours a day.   About 1 a week turns into a migraine.   The more frequent headaches are in the occiput.    Occipital nerve blocks have sometimes helped but she almost lost consciousness with the last one and  felt sick the rest of the day.     Most HA's are tension type in the occiput.  She takes Excedrin and takes about 12/week.  If worse, she takes Fioricet (no more than 4/week).   Tylenol has not helped.    For the migraines with pounding frontal pain and nausea/photophobia/phonophobia she will take Imitrex and that usually helps.       She is on imipramine 75 mg nightly as a prophylactic agent.    She can't take Topiramate or zonisamide due to kidney stones.   Due to ulcers, she can't take NSAIDs.   Gabapentin was once tried but she does not recall if it helped any.    Update 10/06/2017: She is experiencing migraines about 2-3 days a week.    She is doing PT for her left shoulder and the therapy increases her pain.    A lot of the headaches are predominantly occipital    Many years ago, when she had daily headaches, she got some benefit from occipital nerve blocks.   She is currently in imipramine 75 mg nightly.     Imitrex sometimes helps when a HA occurs, more for the frontal HA than for the occipital ones.   Fioricet (2 pills) helps but she is limited in quantity to prevent rebound.   Due to ulcers,  she can't take NSAID's.   Due to kidney stones, she can't take Topamax or Zonegran.   She gets frequent epigastric pain still.   From 07/08/2017: She is currently having 9-12 headaches a month, tension and migraine.     Her tension type HA's are mostly in her neck.  With these headaches, pain is achy and more constant  She lays on a heating pad with benefit.     Moving increases the pain.    She takes Excedrin when a HA is bad.   Due to a severe ulcer (needed 40% stomach removed), she is advised not to take any NSAID.   She also has migraines 4-5  a month with pounding pain behind her eye, nausea, photophobia and phonophobia.   Moving worsens the headaches.   Excedrin sometimes helps.   She has not taken a triptan in over 5 years and used to get some benefit with Imitrex.  I last saw her 5 years ago at a different practice.   After that she lost insurance and stopped her medications.   She had some benefit from a combination of a beta blocker, imipramine and Imitrex and Fioricet for breakthrough   Additionally, for the past 45 months she has had a lot more pain in the  left shoulder. Pain is worse with external rotation and elevation. Pain is also worse if she lays on that side. Slightly helps. She denies any weakness in the arms or legs.  Medication history:    She had some benefit for her migraines with imipramine and Inderal.   She has not tried Topiramate.    Medical issues:   History of gastric surgery for ulcer and cervical fusion                                                                                                                                                                             REVIEW OF SYSTEMS: Constitutional: No fevers, chills, sweats, or change in appetite Eyes: No visual changes, double vision, eye pain Ear, nose and throat: No hearing loss, ear pain, nasal congestion, sore throat Cardiovascular: No chest pain, palpitations Respiratory: No shortness of breath at  rest or with exertion.   No wheezes GastrointestinaI: No nausea, vomiting, diarrhea, abdominal pain, fecal incontinence Genitourinary: No dysuria, urinary retention or frequency.  No nocturia. Musculoskeletal: No neck pain, back pain Integumentary: No rash, pruritus, skin lesions Neurological: as above Psychiatric: No depression at this time.  No anxiety Endocrine: No palpitations, diaphoresis, change in appetite, change in weigh or increased thirst Hematologic/Lymphatic: No anemia, purpura, petechiae. Allergic/Immunologic: No itchy/runny eyes, nasal congestion, recent allergic reactions, rashes  ALLERGIES: Allergies  Allergen Reactions  . Lisinopril Cough    HOME MEDICATIONS:  Current Outpatient Medications:  .  acetaminophen (TYLENOL) 650 MG CR tablet, Take 1,300 mg by mouth 2 (two) times daily., Disp: , Rfl:  .  amLODipine (NORVASC) 10 MG tablet, TAKE 1 TABLET BY MOUTH EVERY DAY, Disp: 90 tablet, Rfl: 0 .  atorvastatin (LIPITOR) 20 MG tablet, Take 1 tablet (20 mg total) by mouth daily., Disp: 90 tablet, Rfl: 1 .  butalbital-acetaminophen-caffeine (FIORICET, ESGIC) 50-325-40 MG tablet, 1 to 2 po prn headache.   No more than 16 pills a month, Disp: 16 tablet, Rfl: 5 .  cetirizine (ZYRTEC) 10 MG tablet, Take 10 mg by mouth daily., Disp: , Rfl:  .  imipramine (TOFRANIL) 25 MG tablet, Take 3 tablets (75 mg total) by mouth at bedtime., Disp: 270 tablet, Rfl: 3 .  Magnesium 500 MG CAPS, Take 500 mg by mouth 2 (two) times a week. , Disp: , Rfl:  .  omeprazole (PRILOSEC) 40 MG capsule, Take 1 capsule (40 mg total) by mouth daily., Disp: 60 capsule, Rfl: 5 .  ondansetron (ZOFRAN) 4 MG tablet, Take 1 tablet (4 mg total) by mouth every 8 (eight) hours as needed., Disp: 20 tablet, Rfl: 5 .  polyethylene glycol (MIRALAX / GLYCOLAX) packet, Take 17 g by mouth daily., Disp: , Rfl:  .  SUMAtriptan (IMITREX) 100 MG tablet, TAKE 1 TAB BY MOUTH AS NEEDED FOR MIGRAINE. MAY REPEAT IN 2 HRS IF HEADACHE  PERSISTS OR RECURS., Disp: 10 tablet, Rfl: 5 .  tiZANidine (ZANAFLEX) 4 MG tablet, Take 1 tablet (4 mg total) by mouth 3 (three) times daily., Disp: 90 tablet, Rfl: 5  PAST MEDICAL HISTORY: Past Medical History:  Diagnosis Date  . Allergy   . Arthritis   . Chicken pox   . Frequent headaches   . GERD (gastroesophageal reflux disease)   . History of kidney stones   . Hyperlipidemia   . Hypertension   . Osteoporosis     PAST SURGICAL HISTORY: Past Surgical History:  Procedure Laterality Date  . CERVICAL FUSION  2000  . ESOPHAGOGASTRODUODENOSCOPY  11/2016  . ESOPHAGOGASTRODUODENOSCOPY (EGD) WITH PROPOFOL N/A 12/28/2016   Procedure: ESOPHAGOGASTRODUODENOSCOPY (EGD) WITH PROPOFOL;  Surgeon: Manus Gunning, MD;  Location: [facility];  Service: Gastroenterology;  Laterality: N/A;  . EYE SURGERY    . TONSILLECTOMY      FAMILY HISTORY: Family History  Problem Relation Age of Onset  . Hyperlipidemia Mother   . Hypertension Mother   . Prostate cancer Father   . Hyperlipidemia Father   . Heart disease Father   . Heart disease Brother   . Hypertension Sister   . Hypertension Maternal Grandmother   . Colon cancer Neg Hx     SOCIAL HISTORY:  Social History   Socioeconomic History  . Marital status: Widowed    Spouse name: Not on file  . Number of children: Not on file  . Years of education: Not on file  . Highest education level: Not on file  Occupational History  . Not on file  Social Needs  . Financial resource strain: Not on file  . Food insecurity:    Worry: Not on file    Inability: Not on file  . Transportation needs:    Medical: Not on file    Non-medical: Not on file  Tobacco Use  . Smoking status: Never Smoker  . Smokeless tobacco: Never Used  Substance and Sexual Activity  . Alcohol use: No    Alcohol/week: 0.0 standard drinks  . Drug use: No  . Sexual activity: Never  Lifestyle  . Physical activity:    Days per week: Not on file    Minutes  per session: Not on file  . Stress: Not on file  Relationships  . Social connections:    Talks on phone: Not on file    Gets together: Not on file    Attends religious service: Not on file    Active member of club or organization: Not on file    Attends meetings of clubs or organizations: Not on file    Relationship status: Not on file  . Intimate partner violence:    Fear of current or ex partner: Not on file    Emotionally abused: Not on file    Physically abused: Not on file    Forced sexual activity: Not on file  Other Topics Concern  . Not on file  Social History Narrative  . Not on file     PHYSICAL EXAM  Vitals:   03/08/18 1008  BP: 119/73  Pulse: 94  Resp: 16  Weight: 107 lb (48.5 kg)  Height: '5\' 2"'$  (1.575 m)    Body mass index is 19.57 kg/m.   General: The patient is well-developed and well-nourished and in no acute distress  Musculoskeletal: She has mild  tenderness over the cervical paraspinal muscles and moderate tenderness over the left occiput/splenius capitis/occipital nerve.  Neurologic Exam  Mental status: The patient is alert and oriented x 3 at the time of the examination. The patient has apparent normal recent and remote memory, with an apparently normal attention span and concentration ability.   Speech is normal.  Cranial nerves: Extraocular muscles are intact.  Facial strength and sensation is normal.  Trapezius strength is normal. . No obvious hearing deficits are noted.  Motor:  Muscle bulk is normal.   Tone is normal. Strength is  5 / 5 in all 4 extremities.   Sensory: Sensory testing is intact to touch  in all 4 extremities.  Coordination: She has good finger-nose-finger.  Gait and station: Station is normal.   Gait is normal. Tandem gait is norm  Reflexes: Deep tendon reflexes are symmetric and normal bilaterally.      DIAGNOSTIC DATA (LABS, IMAGING, TESTING) - I reviewed patient records, labs, notes, testing and imaging myself  where available.  Lab Results  Component Value Date   WBC 4.9 04/26/2017   HGB 14.0 04/26/2017   HCT 43.2 04/26/2017   MCV 99.6 04/26/2017   PLT 308.0 04/26/2017      Component Value Date/Time   NA 139 08/26/2017 0833   K 4.6 08/26/2017 0833   CL 106 08/26/2017 0833   CO2 27 08/26/2017 0833   GLUCOSE 109 (H) 08/26/2017 0833   BUN 17 08/26/2017 0833   CREATININE 1.05 08/26/2017 0833   CALCIUM 9.3 08/26/2017 0833   PROT 6.6 08/26/2017 0833   ALBUMIN 3.9 08/26/2017 0833   AST 23 08/26/2017 0833   ALT 21 08/26/2017 0833   ALKPHOS 89 08/26/2017 0833   BILITOT 0.2 08/26/2017 0833   GFRNONAA 49 (L) 12/17/2009 0855   GFRAA (L) 12/17/2009 0855    59        The eGFR has been calculated using the MDRD equation. This calculation has not been validated in all clinical situations. eGFR's persistently <60 mL/min signify possible Chronic Kidney Disease.   Lab Results  Component Value Date   CHOL 172 08/26/2017   HDL 50.30 08/26/2017   LDLCALC 104 (H) 08/26/2017   TRIG 89.0 08/26/2017   CHOLHDL 3 08/26/2017      ASSESSMENT AND PLAN  Episodic tension-type headache, not intractable  Neck pain  Common migraine without intractability   1.   Continue imipramine 75 mg nightly.  Add and slowly titrate up to 4 mg 3 times a day.  If no benefit after a month of the higher dose she can stop. 2.    Imitrex prn migraine and Fioricet prn tension type - no more than 16/month 3.    Exercise and stay active. 4.    RTC 6 months   Richard A. Felecia Shelling, MD, [facility] 0/11/1100, 11:17 AM Certified in Neurology, Clinical Neurophysiology, Sleep Medicine, Pain Medicine and Neuroimaging  [facility] [address], [place], [place] [address] [phone]

## 2018-03-15 ENCOUNTER — Telehealth: Payer: Self-pay | Admitting: Neurology

## 2018-03-15 MED ORDER — METHOCARBAMOL 500 MG PO TABS: 500.0000 mg | ORAL_TABLET | Freq: Three times a day (TID) | ORAL | 3 refills | Status: DC

## 2018-03-15 NOTE — Telephone Encounter (Signed)
Pt requesting a call to discuss alternate medications, stating that tiZANidine (ZANAFLEX) 4 MG tablet has made her very sick to her stomach. Please call to advise

## 2018-03-15 NOTE — Telephone Encounter (Signed)
Spoke with Teresa Lozano.  She c/o n/v with even 1/2 tab of Tizanidine. Per RAS, I explained that he gave her this particular muscle relaxer b/c it had the best h/a data.  She can however, try Robaxin 500mg  tid. Pt. verbalized understanding of same and is agreeable. Per v/o RAS, [facility]

## 2018-03-31 ENCOUNTER — Other Ambulatory Visit: Payer: Self-pay | Admitting: Internal Medicine

## 2018-04-10 ENCOUNTER — Encounter: Payer: Self-pay | Admitting: Family Medicine

## 2018-04-10 ENCOUNTER — Ambulatory Visit (INDEPENDENT_AMBULATORY_CARE_PROVIDER_SITE_OTHER): Payer: Medicare Other | Admitting: Family Medicine

## 2018-04-10 VITALS — BP 136/82 | HR 115 | Temp 98.2°F | Ht 62.0 in | Wt 105.5 lb

## 2018-04-10 DIAGNOSIS — N390 Urinary tract infection, site not specified: Secondary | ICD-10-CM | POA: Insufficient documentation

## 2018-04-10 DIAGNOSIS — R339 Retention of urine, unspecified: Secondary | ICD-10-CM | POA: Diagnosis not present

## 2018-04-10 DIAGNOSIS — N3000 Acute cystitis without hematuria: Secondary | ICD-10-CM | POA: Diagnosis not present

## 2018-04-10 DIAGNOSIS — R109 Unspecified abdominal pain: Secondary | ICD-10-CM

## 2018-04-10 LAB — POC URINALSYSI DIPSTICK (AUTOMATED)
BILIRUBIN UA: NEGATIVE
Blood, UA: 50
Glucose, UA: NEGATIVE
KETONES UA: NEGATIVE
Nitrite, UA: NEGATIVE
Protein, UA: POSITIVE — AB
Urobilinogen, UA: 0.2 E.U./dL
pH, UA: 6 (ref 5.0–8.0)

## 2018-04-10 MED ORDER — TRAMADOL HCL 50 MG PO TABS: 50.0000 mg | ORAL_TABLET | Freq: Two times a day (BID) | ORAL | 0 refills | Status: DC | PRN

## 2018-04-10 MED ORDER — TAMSULOSIN HCL 0.4 MG PO CAPS: 0.4000 mg | ORAL_CAPSULE | Freq: Every day | ORAL | 1 refills | Status: DC | PRN

## 2018-04-10 MED ORDER — SULFAMETHOXAZOLE-TRIMETHOPRIM 800-160 MG PO TABS: 1.0000 | ORAL_TABLET | Freq: Two times a day (BID) | ORAL | 0 refills | Status: DC

## 2018-04-10 NOTE — Assessment & Plan Note (Signed)
Pos UA with L flank pain in pt with h/o renal stones (stating it feels like a stone) tx with bactrim DS cx pending Given urine screen to catch stone Disc imp of hydration (has zofran to minimize nausea to take in more water)  flomax px for daily use to help move stone/ empty bladder (disc poss side eff)  Tramadol for pain prn  Will call/update if stone passes  Close f/u

## 2018-04-10 NOTE — Assessment & Plan Note (Signed)
L flank pain with pos UA uti and kidney stone are in the differential tx with bactrim ds  Inc fluids  flomax  Tramadol prn pain (do not mix with fiorcet and also use caution with SSRI) -she has taken this before  Urine screen given  Close f/u  Update if stone passes

## 2018-04-10 NOTE — Progress Notes (Signed)
Subjective:    Patient ID: CHALEY Lozano, female    DOB: 12/27/1950, 67 y.o.   MRN: [id number]  HPI  67 yo pt of NP Baity here with symptoms of a kidney stone   Symptoms started this weekend  Nausea  Pain in L side /flank  Urgency to urinate - dribbles /not much volume  Worse in am  No visible blood  No fever    UA today  Results for orders placed or performed in visit on 04/10/18  POCT Urinalysis Dipstick (Automated)  Result Value Ref Range   Color, UA Yellow    Clarity, UA Cloudy    Glucose, UA Negative Negative   Bilirubin, UA Negative    Ketones, UA Negative    Spec Grav, UA >=1.030 (A) 1.010 - 1.025   Blood, UA 50 Ery/uL    pH, UA 6.0 5.0 - 8.0   Protein, UA Positive (A) Negative   Urobilinogen, UA 0.2 0.2 or 1.0 E.U./dL   Nitrite, UA Negative    Leukocytes, UA Large (3+) (A) Negative   concentrated  Trying to drink water- a little nauseated   CT scan 5/18- did show tiny non obst renal stones Last one passed about 10 y ago  Had lithotripsy in the past    BP Readings from Last 3 Encounters:  04/10/18 136/82  03/08/18 119/73  12/28/17 116/72   Pulse Readings from Last 3 Encounters:  04/10/18 (!) 115  03/08/18 94  12/28/17 97   Patient Active Problem List   Diagnosis Date Noted  . UTI (urinary tract infection) 04/10/2018  . Flank pain 04/10/2018  . Common migraine without intractability 07/08/2017  . Neck pain 07/08/2017  . Left shoulder pain 07/08/2017  . Tension-type headache 07/08/2017  . Chronic prepyloric ulcer with gastric outlet obstruction s/p distal gastrectomy 05/04/2017 05/04/2017  . History of Billroth II gastrojejunostomy reconstruction 05/04/2017 05/04/2017  . History of truncal vagotomy for chronic ulcer disease 05/04/2017 05/04/2017  . Age-related osteoporosis without current pathological fracture 04/26/2017  . Arthritis 12/06/2016  . HLD (hyperlipidemia) 12/01/2015  . Essential hypertension 11/10/2015  . Frequent headaches  11/10/2015  . GERD (gastroesophageal reflux disease) 11/10/2015  . Seasonal allergies 11/10/2015   Past Medical History:  Diagnosis Date  . Allergy   . Arthritis   . Chicken pox   . Frequent headaches   . GERD (gastroesophageal reflux disease)   . History of kidney stones   . Hyperlipidemia   . Hypertension   . Osteoporosis    Past Surgical History:  Procedure Laterality Date  . CERVICAL FUSION  2000  . ESOPHAGOGASTRODUODENOSCOPY  11/2016  . ESOPHAGOGASTRODUODENOSCOPY (EGD) WITH PROPOFOL N/A 12/28/2016   Procedure: ESOPHAGOGASTRODUODENOSCOPY (EGD) WITH PROPOFOL;  Surgeon: Manus Gunning, MD;  Location: [facility];  Service: Gastroenterology;  Laterality: N/A;  . EYE SURGERY    . TONSILLECTOMY     Social History   Tobacco Use  . Smoking status: Never Smoker  . Smokeless tobacco: Never Used  Substance Use Topics  . Alcohol use: No    Alcohol/week: 0.0 standard drinks  . Drug use: No   Family History  Problem Relation Age of Onset  . Hyperlipidemia Mother   . Hypertension Mother   . Prostate cancer Father   . Hyperlipidemia Father   . Heart disease Father   . Heart disease Brother   . Hypertension Sister   . Hypertension Maternal Grandmother   . Colon cancer Neg Hx    Allergies  Allergen Reactions  .  Robaxin [Methocarbamol] Nausea And Vomiting  . Lisinopril Cough   Current Outpatient Medications on File Prior to Visit  Medication Sig Dispense Refill  . acetaminophen (TYLENOL) 650 MG CR tablet Take 1,300 mg by mouth 2 (two) times daily.    Marland Kitchen amLODipine (NORVASC) 10 MG tablet TAKE 1 TABLET BY MOUTH EVERY DAY 90 tablet 0  . atorvastatin (LIPITOR) 20 MG tablet Take 1 tablet (20 mg total) by mouth daily. MUST SCHEDULE ANNUAL EXAM 90 tablet 0  . butalbital-acetaminophen-caffeine (FIORICET, ESGIC) 50-325-40 MG tablet 1 to 2 po prn headache.   No more than 16 pills a month 16 tablet 5  . cetirizine (ZYRTEC) 10 MG tablet Take 10 mg by mouth daily.    Marland Kitchen  imipramine (TOFRANIL) 25 MG tablet Take 3 tablets (75 mg total) by mouth at bedtime. 270 tablet 3  . Magnesium 500 MG CAPS Take 500 mg by mouth 2 (two) times a week.     Marland Kitchen omeprazole (PRILOSEC) 40 MG capsule Take 1 capsule (40 mg total) by mouth daily. 60 capsule 5  . ondansetron (ZOFRAN) 4 MG tablet Take 1 tablet (4 mg total) by mouth every 8 (eight) hours as needed. 20 tablet 5  . polyethylene glycol (MIRALAX / GLYCOLAX) packet Take 17 g by mouth daily.    . SUMAtriptan (IMITREX) 100 MG tablet TAKE 1 TAB BY MOUTH AS NEEDED FOR MIGRAINE. MAY REPEAT IN 2 HRS IF HEADACHE PERSISTS OR RECURS. 10 tablet 5   No current facility-administered medications on file prior to visit.     Review of Systems  Constitutional: Negative for activity change, appetite change, fatigue, fever and unexpected weight change.  HENT: Negative for congestion, ear pain, rhinorrhea, sinus pressure and sore throat.   Eyes: Negative for pain, redness and visual disturbance.  Respiratory: Negative for cough, shortness of breath and wheezing.   Cardiovascular: Negative for chest pain and palpitations.  Gastrointestinal: Negative for abdominal pain, blood in stool, constipation and diarrhea.  Endocrine: Negative for polydipsia and polyuria.  Genitourinary: Positive for difficulty urinating, dysuria and urgency. Negative for frequency, hematuria and vaginal discharge.  Musculoskeletal: Negative for arthralgias, back pain and myalgias.       Left flank pain   Skin: Negative for pallor and rash.  Allergic/Immunologic: Negative for environmental allergies.  Neurological: Negative for dizziness, syncope and headaches.  Hematological: Negative for adenopathy. Does not bruise/bleed easily.  Psychiatric/Behavioral: Negative for decreased concentration and dysphoric mood. The patient is not nervous/anxious.        Objective:   Physical Exam  Constitutional: She appears well-developed and well-nourished. No distress.  Well but  fatigued appearing   HENT:  Head: Normocephalic and atraumatic.  Eyes: Pupils are equal, round, and reactive to light. Conjunctivae and EOM are normal.  Neck: Normal range of motion. Neck supple.  Cardiovascular: Normal rate, regular rhythm and normal heart sounds.  Pulmonary/Chest: Effort normal and breath sounds normal.  Abdominal: Soft. Bowel sounds are normal. She exhibits no distension and no mass. There is tenderness. There is no rebound.  Mild L cva tenderness  Mild suprapubic tenderness  Musculoskeletal: She exhibits no edema.  Lymphadenopathy:    She has no cervical adenopathy.  Neurological: She is alert. She exhibits normal muscle tone.  Skin: Skin is warm and dry. Capillary refill takes less than 2 seconds. No rash noted.  Psychiatric: She has a normal mood and affect.          Assessment & Plan:   Problem List Items Addressed  This Visit      Genitourinary   UTI (urinary tract infection) - Primary    Pos UA with L flank pain in pt with h/o renal stones (stating it feels like a stone) tx with bactrim DS cx pending Given urine screen to catch stone Disc imp of hydration (has zofran to minimize nausea to take in more water)  flomax px for daily use to help move stone/ empty bladder (disc poss side eff)  Tramadol for pain prn  Will call/update if stone passes  Close f/u      Relevant Medications   sulfamethoxazole-trimethoprim (BACTRIM DS,SEPTRA DS) 800-160 MG tablet   Other Relevant Orders   Urine Culture     Other   Flank pain    L flank pain with pos UA uti and kidney stone are in the differential tx with bactrim ds  Inc fluids  flomax  Tramadol prn pain (do not mix with fiorcet and also use caution with SSRI) -she has taken this before  Urine screen given  Close f/u  Update if stone passes        Other Visit Diagnoses    Urinary retention       Relevant Orders   POCT Urinalysis Dipstick (Automated) (Completed)

## 2018-04-10 NOTE — Patient Instructions (Signed)
Take zofran if needed for nausea-so you can drink water  Tramadol for pain with caution (do not mix with other pain medicines)   Take the bactrim ds as directed  Also try flomax to see if it helps urinate (and pass a stone if there is one)  If you do pass a stone - let us know   Also - if worse /severe pain go to ED or if not improving in 3-4 days let us know   We will culture urine also

## 2018-04-11 LAB — URINE CULTURE
MICRO NUMBER:: 91139772
SPECIMEN QUALITY: ADEQUATE

## 2018-04-18 ENCOUNTER — Other Ambulatory Visit: Payer: Self-pay | Admitting: Internal Medicine

## 2018-06-07 ENCOUNTER — Telehealth: Payer: Self-pay | Admitting: Neurology

## 2018-06-07 NOTE — Telephone Encounter (Signed)
[facility] Advised back on 09/2017 we sent in rx #[id number], 3 refills. They verified they had this on file. Had one refill left. They will get this filled for pt. Last month she refilled for #180. Nothing further needed.

## 2018-06-07 NOTE — Telephone Encounter (Signed)
Pt states she needs a new prescription for her imipramine (TOFRANIL) 25 MG tablet stating she is now taking 3 at night so insurance will cover and pharmacy will fill

## 2018-06-07 NOTE — Telephone Encounter (Addendum)
I called pt. Relayed below information. She verbalized understanding. Reminded her of her appt 09/08/18 at 930am.

## 2018-06-24 ENCOUNTER — Other Ambulatory Visit: Payer: Self-pay | Admitting: Internal Medicine

## 2018-07-14 ENCOUNTER — Other Ambulatory Visit: Payer: Self-pay | Admitting: Neurology

## 2018-07-14 DIAGNOSIS — R1013 Epigastric pain: Secondary | ICD-10-CM

## 2018-07-17 ENCOUNTER — Telehealth: Payer: Self-pay | Admitting: Internal Medicine

## 2018-07-17 ENCOUNTER — Ambulatory Visit: Payer: Medicare Other | Admitting: Internal Medicine

## 2018-07-17 DIAGNOSIS — Z0289 Encounter for other administrative examinations: Secondary | ICD-10-CM

## 2018-07-17 DIAGNOSIS — Z23 Encounter for immunization: Secondary | ICD-10-CM | POA: Diagnosis not present

## 2018-07-17 MED ORDER — AMLODIPINE BESYLATE 10 MG PO TABS: 10.0000 mg | ORAL_TABLET | Freq: Every day | ORAL | 0 refills | Status: DC

## 2018-07-17 NOTE — Telephone Encounter (Signed)
Rx sent through e-scribe  

## 2018-07-17 NOTE — Addendum Note (Signed)
Addended by: Lurlean Nanny on: 07/17/2018 04:46 PM   Modules accepted: Orders

## 2018-07-17 NOTE — Telephone Encounter (Signed)
Pt is requesting a refill of amlodipine 10MG  to [facility] . She is scheduled for 09/05/18 AWV.

## 2018-07-30 ENCOUNTER — Other Ambulatory Visit: Payer: Self-pay | Admitting: Internal Medicine

## 2018-08-09 ENCOUNTER — Other Ambulatory Visit: Payer: Self-pay | Admitting: Internal Medicine

## 2018-08-10 MED ORDER — ATORVASTATIN CALCIUM 20 MG PO TABS: 20.0000 mg | ORAL_TABLET | Freq: Every day | ORAL | 0 refills | Status: DC

## 2018-08-10 NOTE — Addendum Note (Signed)
Addended by: Lurlean Nanny on: 08/10/2018 12:49 PM   Modules accepted: Orders

## 2018-08-31 ENCOUNTER — Other Ambulatory Visit: Payer: Self-pay | Admitting: Neurology

## 2018-09-05 ENCOUNTER — Ambulatory Visit (INDEPENDENT_AMBULATORY_CARE_PROVIDER_SITE_OTHER): Payer: Medicare Other | Admitting: Internal Medicine

## 2018-09-05 ENCOUNTER — Encounter: Payer: Self-pay | Admitting: Internal Medicine

## 2018-09-05 VITALS — BP 122/70 | HR 94 | Temp 97.8°F | Ht 62.0 in | Wt 106.0 lb

## 2018-09-05 DIAGNOSIS — R519 Headache, unspecified: Secondary | ICD-10-CM

## 2018-09-05 DIAGNOSIS — Z1211 Encounter for screening for malignant neoplasm of colon: Secondary | ICD-10-CM | POA: Diagnosis not present

## 2018-09-05 DIAGNOSIS — K219 Gastro-esophageal reflux disease without esophagitis: Secondary | ICD-10-CM | POA: Diagnosis not present

## 2018-09-05 DIAGNOSIS — I10 Essential (primary) hypertension: Secondary | ICD-10-CM | POA: Diagnosis not present

## 2018-09-05 DIAGNOSIS — N183 Chronic kidney disease, stage 3 unspecified: Secondary | ICD-10-CM

## 2018-09-05 DIAGNOSIS — M1990 Unspecified osteoarthritis, unspecified site: Secondary | ICD-10-CM | POA: Diagnosis not present

## 2018-09-05 DIAGNOSIS — E559 Vitamin D deficiency, unspecified: Secondary | ICD-10-CM

## 2018-09-05 DIAGNOSIS — R51 Headache: Secondary | ICD-10-CM | POA: Diagnosis not present

## 2018-09-05 DIAGNOSIS — E7800 Pure hypercholesterolemia, unspecified: Secondary | ICD-10-CM | POA: Diagnosis not present

## 2018-09-05 DIAGNOSIS — Z0000 Encounter for general adult medical examination without abnormal findings: Secondary | ICD-10-CM

## 2018-09-05 LAB — COMPREHENSIVE METABOLIC PANEL
ALBUMIN: 3.6 g/dL (ref 3.5–5.2)
ALT: 16 U/L (ref 0–35)
AST: 19 U/L (ref 0–37)
Alkaline Phosphatase: 103 U/L (ref 39–117)
BUN: 20 mg/dL (ref 6–23)
CO2: 29 mEq/L (ref 19–32)
CREATININE: 1.08 mg/dL (ref 0.40–1.20)
Calcium: 9 mg/dL (ref 8.4–10.5)
Chloride: 108 mEq/L (ref 96–112)
GFR: 50.7 mL/min — ABNORMAL LOW (ref >60.00)
Glucose, Bld: 89 mg/dL (ref 70–99)
Potassium: 5 mEq/L (ref 3.5–5.1)
Sodium: 142 mEq/L (ref 135–145)
Total Bilirubin: 0.2 mg/dL (ref 0.2–1.2)
Total Protein: 5.8 g/dL — ABNORMAL LOW (ref 6.0–8.3)

## 2018-09-05 LAB — CBC
HCT: 38.1 % (ref 36.0–46.0)
Hemoglobin: 12.2 g/dL (ref 12.0–15.0)
MCHC: 32 g/dL (ref 30.0–36.0)
MCV: 93.3 fl (ref 78.0–100.0)
Platelets: 277 10*3/uL (ref 150.0–400.0)
RBC: 4.09 Mil/uL (ref 3.87–5.11)
RDW: 15.4 % (ref 11.5–15.5)
WBC: 5.4 10*3/uL (ref 4.0–10.5)

## 2018-09-05 LAB — VITAMIN D 25 HYDROXY (VIT D DEFICIENCY, FRACTURES): VITD: 11.19 ng/mL — AB (ref 30.00–100.00)

## 2018-09-05 LAB — LIPID PANEL
Cholesterol: 187 mg/dL (ref 0–200)
HDL: 51.3 mg/dL (ref >39.00)
LDL Cholesterol: 120 mg/dL — ABNORMAL HIGH (ref 0–99)
NonHDL: 136.13
Total CHOL/HDL Ratio: 4
Triglycerides: 79 mg/dL (ref 0.0–149.0)
VLDL: 15.8 mg/dL (ref 0.0–40.0)

## 2018-09-05 NOTE — Patient Instructions (Signed)
Health Maintenance for Postmenopausal Women Menopause is a normal process in which your reproductive ability comes to an end. This process happens gradually over a span of months to years, usually between the ages of 62 and 89. Menopause is complete when you have missed 12 consecutive menstrual periods. It is important to talk with your health care provider about some of the most common conditions that affect postmenopausal women, such as heart disease, cancer, and bone loss (osteoporosis). Adopting a healthy lifestyle and getting preventive care can help to promote your health and wellness. Those actions can also lower your chances of developing some of these common conditions. What should I know about menopause? During menopause, you may experience a number of symptoms, such as:  Moderate-to-severe hot flashes.  Night sweats.  Decrease in sex drive.  Mood swings.  Headaches.  Tiredness.  Irritability.  Memory problems.  Insomnia. Choosing to treat or not to treat menopausal changes is an individual decision that you make with your health care provider. What should I know about hormone replacement therapy and supplements? Hormone therapy products are effective for treating symptoms that are associated with menopause, such as hot flashes and night sweats. Hormone replacement carries certain risks, especially as you become older. If you are thinking about using estrogen or estrogen with progestin treatments, discuss the benefits and risks with your health care provider. What should I know about heart disease and stroke? Heart disease, heart attack, and stroke become more likely as you age. This may be due, in part, to the hormonal changes that your body experiences during menopause. These can affect how your body processes dietary fats, triglycerides, and cholesterol. Heart attack and stroke are both medical emergencies. There are many things that you can do to help prevent heart disease  and stroke:  Have your blood pressure checked at least every 1-2 years. High blood pressure causes heart disease and increases the risk of stroke.  If you are 79-72 years old, ask your health care provider if you should take aspirin to prevent a heart attack or a stroke.  Do not use any tobacco products, including cigarettes, chewing tobacco, or electronic cigarettes. If you need help quitting, ask your health care provider.  It is important to eat a healthy diet and maintain a healthy weight. ? Be sure to include plenty of vegetables, fruits, low-fat dairy products, and lean protein. ? Avoid eating foods that are high in solid fats, added sugars, or salt (sodium).  Get regular exercise. This is one of the most important things that you can do for your health. ? Try to exercise for at least 150 minutes each week. The type of exercise that you do should increase your heart rate and make you sweat. This is known as moderate-intensity exercise. ? Try to do strengthening exercises at least twice each week. Do these in addition to the moderate-intensity exercise.  Know your numbers.Ask your health care provider to check your cholesterol and your blood glucose. Continue to have your blood tested as directed by your health care provider.  What should I know about cancer screening? There are several types of cancer. Take the following steps to reduce your risk and to catch any cancer development as early as possible. Breast Cancer  Practice breast self-awareness. ? This means understanding how your breasts normally appear and feel. ? It also means doing regular breast self-exams. Let your health care provider know about any changes, no matter how small.  If you are 40 or  older, have a clinician do a breast exam (clinical breast exam or CBE) every year. Depending on your age, family history, and medical history, it may be recommended that you also have a yearly breast X-ray (mammogram).  If you  have a family history of breast cancer, talk with your health care provider about genetic screening.  If you are at high risk for breast cancer, talk with your health care provider about having an MRI and a mammogram every year.  Breast cancer (BRCA) gene test is recommended for women who have family members with BRCA-related cancers. Results of the assessment will determine the need for genetic counseling and BRCA1 and for BRCA2 testing. BRCA-related cancers include these types: ? Breast. This occurs in males or females. ? Ovarian. ? Tubal. This may also be called fallopian tube cancer. ? Cancer of the abdominal or pelvic lining (peritoneal cancer). ? Prostate. ? Pancreatic. Cervical, Uterine, and Ovarian Cancer Your health care provider may recommend that you be screened regularly for cancer of the pelvic organs. These include your ovaries, uterus, and vagina. This screening involves a pelvic exam, which includes checking for microscopic changes to the surface of your cervix (Pap test).  For women ages 21-65, health care providers may recommend a pelvic exam and a Pap test every three years. For women ages 39-65, they may recommend the Pap test and pelvic exam, combined with testing for human papilloma virus (HPV), every five years. Some types of HPV increase your risk of cervical cancer. Testing for HPV may also be done on women of any age who have unclear Pap test results.  Other health care providers may not recommend any screening for nonpregnant women who are considered low risk for pelvic cancer and have no symptoms. Ask your health care provider if a screening pelvic exam is right for you.  If you have had past treatment for cervical cancer or a condition that could lead to cancer, you need Pap tests and screening for cancer for at least 20 years after your treatment. If Pap tests have been discontinued for you, your risk factors (such as having a new sexual partner) need to be reassessed  to determine if you should start having screenings again. Some women have medical problems that increase the chance of getting cervical cancer. In these cases, your health care provider may recommend that you have screening and Pap tests more often.  If you have a family history of uterine cancer or ovarian cancer, talk with your health care provider about genetic screening.  If you have vaginal bleeding after reaching menopause, tell your health care provider.  There are currently no reliable tests available to screen for ovarian cancer. Lung Cancer Lung cancer screening is recommended for adults 57-50 years old who are at high risk for lung cancer because of a history of smoking. A yearly low-dose CT scan of the lungs is recommended if you:  Currently smoke.  Have a history of at least 30 pack-years of smoking and you currently smoke or have quit within the past 15 years. A pack-year is smoking an average of one pack of cigarettes per day for one year. Yearly screening should:  Continue until it has been 15 years since you quit.  Stop if you develop a health problem that would prevent you from having lung cancer treatment. Colorectal Cancer  This type of cancer can be detected and can often be prevented.  Routine colorectal cancer screening usually begins at age 12 and continues through  age 75.  If you have risk factors for colon cancer, your health care provider may recommend that you be screened at an earlier age.  If you have a family history of colorectal cancer, talk with your health care provider about genetic screening.  Your health care provider may also recommend using home test kits to check for hidden blood in your stool.  A small camera at the end of a tube can be used to examine your colon directly (sigmoidoscopy or colonoscopy). This is done to check for the earliest forms of colorectal cancer.  Direct examination of the colon should be repeated every 5-10 years until  age 75. However, if early forms of precancerous polyps or small growths are found or if you have a family history or genetic risk for colorectal cancer, you may need to be screened more often. Skin Cancer  Check your skin from head to toe regularly.  Monitor any moles. Be sure to tell your health care provider: ? About any new moles or changes in moles, especially if there is a change in a mole's shape or color. ? If you have a mole that is larger than the size of a pencil eraser.  If any of your family members has a history of skin cancer, especially at a young age, talk with your health care provider about genetic screening.  Always use sunscreen. Apply sunscreen liberally and repeatedly throughout the day.  Whenever you are outside, protect yourself by wearing long sleeves, pants, a wide-brimmed hat, and sunglasses. What should I know about osteoporosis? Osteoporosis is a condition in which bone destruction happens more quickly than new bone creation. After menopause, you may be at an increased risk for osteoporosis. To help prevent osteoporosis or the bone fractures that can happen because of osteoporosis, the following is recommended:  If you are 19-50 years old, get at least 1,000 mg of calcium and at least 600 mg of vitamin D per day.  If you are older than age 50 but younger than age 70, get at least 1,200 mg of calcium and at least 600 mg of vitamin D per day.  If you are older than age 70, get at least 1,200 mg of calcium and at least 800 mg of vitamin D per day. Smoking and excessive alcohol intake increase the risk of osteoporosis. Eat foods that are rich in calcium and vitamin D, and do weight-bearing exercises several times each week as directed by your health care provider. What should I know about how menopause affects my mental health? Depression may occur at any age, but it is more common as you become older. Common symptoms of depression include:  Low or sad  mood.  Changes in sleep patterns.  Changes in appetite or eating patterns.  Feeling an overall lack of motivation or enjoyment of activities that you previously enjoyed.  Frequent crying spells. Talk with your health care provider if you think that you are experiencing depression. What should I know about immunizations? It is important that you get and maintain your immunizations. These include:  Tetanus, diphtheria, and pertussis (Tdap) booster vaccine.  Influenza every year before the flu season begins.  Pneumonia vaccine.  Shingles vaccine. Your health care provider may also recommend other immunizations. This information is not intended to replace advice given to you by your health care provider. Make sure you discuss any questions you have with your health care provider. Document Released: 08/27/2005 Document Revised: 01/23/2016 Document Reviewed: 04/08/2015 Elsevier Interactive Patient Education    2019 [place].

## 2018-09-05 NOTE — Progress Notes (Signed)
HPI:  Patient presents to the clinic today for her Medicare wellness exam.  She is also due to follow-up chronic conditions.  Arthritis: Mainly in her hands.  She takes ibuprofen as needed with good relief.  Frequent headaches: These occur a few times a week.  She takes Imitrex as needed with good relief. She is not currently following with neurology.  GERD: She denies breakthrough on Omeprazole.  Upper GI from 12/2016 reviewed.  History of kidney stones: She uses Flomax as needed.  She has seen a urologist in the past for the same.  HLD: Her last LDL was 104, 08/2017.  She denies myalgias on Atorvastatin.  She tries to consume a low-fat diet.  HTN: Her BP today is 122/70.  She is taking Amlodipine as prescribed.  ECG from 04/2017 reviewed.  Past Medical History:  Diagnosis Date  . Allergy   . Arthritis   . Chicken pox   . Frequent headaches   . GERD (gastroesophageal reflux disease)   . History of kidney stones   . Hyperlipidemia   . Hypertension   . Osteoporosis     Current Outpatient Medications  Medication Sig Dispense Refill  . acetaminophen (TYLENOL) 650 MG CR tablet Take 1,300 mg by mouth 2 (two) times daily.    Marland Kitchen amLODipine (NORVASC) 10 MG tablet Take 1 tablet (10 mg total) by mouth daily. 90 tablet 0  . atorvastatin (LIPITOR) 20 MG tablet Take 1 tablet (20 mg total) by mouth daily. 90 tablet 0  . butalbital-acetaminophen-caffeine (FIORICET, ESGIC) 50-325-40 MG tablet 1 to 2 po prn headache.   No more than 16 pills a month 16 tablet 5  . cetirizine (ZYRTEC) 10 MG tablet Take 10 mg by mouth daily.    Marland Kitchen imipramine (TOFRANIL) 25 MG tablet Take 3 tablets (75 mg total) by mouth at bedtime. 270 tablet 3  . Magnesium 500 MG CAPS Take 500 mg by mouth 2 (two) times a week.     Marland Kitchen omeprazole (PRILOSEC) 40 MG capsule Take 1 capsule (40 mg total) by mouth daily. 60 capsule 5  . ondansetron (ZOFRAN) 4 MG tablet TAKE 1 TABLET BY MOUTH EVERY 8 HOURS AS NEEDED 20 tablet 5  . polyethylene  glycol (MIRALAX / GLYCOLAX) packet Take 17 g by mouth daily.    . SUMAtriptan (IMITREX) 100 MG tablet TAKE 1 TAB AS NEEDED FOR MIGRAINE. MAY REPEAT IN 2 HRS IF HEADACHE PERSISTS OR RECURS. DNF 6/19 10 tablet 5  . tamsulosin (FLOMAX) 0.4 MG CAPS capsule Take 1 capsule (0.4 mg total) by mouth daily as needed (kidney stone). 15 capsule 1   No current facility-administered medications for this visit.     Allergies  Allergen Reactions  . Robaxin [Methocarbamol] Nausea And Vomiting  . Lisinopril Cough    Family History  Problem Relation Age of Onset  . Hyperlipidemia Mother   . Hypertension Mother   . Prostate cancer Father   . Hyperlipidemia Father   . Heart disease Father   . Heart disease Brother   . Hypertension Sister   . Hypertension Maternal Grandmother   . Colon cancer Neg Hx     Social History   Socioeconomic History  . Marital status: Widowed    Spouse name: Not on file  . Number of children: Not on file  . Years of education: Not on file  . Highest education level: Not on file  Occupational History  . Not on file  Social Needs  . Financial resource strain: Not  on file  . Food insecurity:    Worry: Not on file    Inability: Not on file  . Transportation needs:    Medical: Not on file    Non-medical: Not on file  Tobacco Use  . Smoking status: Never Smoker  . Smokeless tobacco: Never Used  Substance and Sexual Activity  . Alcohol use: No    Alcohol/week: 0.0 standard drinks  . Drug use: No  . Sexual activity: Never  Lifestyle  . Physical activity:    Days per week: Not on file    Minutes per session: Not on file  . Stress: Not on file  Relationships  . Social connections:    Talks on phone: Not on file    Gets together: Not on file    Attends religious service: Not on file    Active member of club or organization: Not on file    Attends meetings of clubs or organizations: Not on file    Relationship status: Not on file  . Intimate partner violence:     Fear of current or ex partner: Not on file    Emotionally abused: Not on file    Physically abused: Not on file    Forced sexual activity: Not on file  Other Topics Concern  . Not on file  Social History Narrative  . Not on file    Hospitiliaztions: None  Health Maintenance:    Flu: 06/2018  Tetanus: 2011  Pneumovax: 06/2018  Prevnar: 04/2017  Zostavax: Never  Mammogram: 03/2017  Pap Smear: Greater than 5 years ago  Bone Density: 03/2017   Colon Screening: Greater than 10 years ago  Eye Doctor: Annually  Dental Exam: As needed   Providers:   PCP: Webb Silversmith, NP-C  Gastroenterologist: Dr. Enis Gash    I have personally reviewed and have noted:  1. The patient's medical and social history 2. Their use of alcohol, tobacco or illicit drugs 3. Their current medications and supplements 4. The patient's functional ability including ADL's, fall risks, home safety risks and hearing or visual impairment. 5. Diet and physical activities 6. Evidence for depression or mood disorder  Subjective:   Review of Systems:   Constitutional: Patient reports intermittent headaches.  Denies fever, malaise, fatigue, or abrupt weight changes.  HEENT: Denies eye pain, eye redness, ear pain, ringing in the ears, wax buildup, runny nose, nasal congestion, bloody nose, or sore throat. Respiratory: Denies difficulty breathing, shortness of breath, cough or sputum production.   Cardiovascular: Denies chest pain, chest tightness, palpitations or swelling in the hands or feet.  Gastrointestinal: Denies abdominal pain, bloating, constipation, diarrhea or blood in the stool.  GU: Denies urgency, frequency, pain with urination, burning sensation, blood in urine, odor or discharge. Musculoskeletal: Patient reports intermittent joint pain.  Denies decrease in range of motion, difficulty with gait, muscle pain or joint swelling.  Skin: Denies redness, rashes, lesions or ulcercations.  Neurological:  Denies dizziness, difficulty with memory, difficulty with speech or problems with balance and coordination.  Psych: Denies anxiety, depression, SI/HI.  No other specific complaints in a complete review of systems (except as listed in HPI above).  Objective:  PE:   BP 122/70   Pulse 94   Temp 97.8 F (36.6 C) (Oral)   Ht '5\' 2"'$  (1.575 m)   Wt 106 lb (48.1 kg)   SpO2 97%   BMI 19.39 kg/m  Wt Readings from Last 3 Encounters:  09/05/18 106 lb (48.1 kg)  04/10/18 105 lb  8 oz (47.9 kg)  03/08/18 107 lb (48.5 kg)    General: Appears her stated age, well developed, well nourished in NAD. Skin: Warm, dry and intact.  HEENT: Head: normal shape and size; Eyes: sclera white, no icterus, conjunctiva pink, PERRLA and EOMs intact; Ears: Tm's gray and intact, normal light reflex; Throat/Mouth: Teeth present, mucosa pink and moist, no exudate, lesions or ulcerations noted.  Neck: Neck supple, trachea midline. No masses, lumps or thyromegaly present.  Cardiovascular: Normal rate and rhythm. S1,S2 noted.  No murmur, rubs or gallops noted. No JVD or BLE edema. No carotid bruits noted. Pulmonary/Chest: Normal effort and positive vesicular breath sounds. No respiratory distress. No wheezes, rales or ronchi noted.  Abdomen: Soft and nontender. Normal bowel sounds. No distention or masses noted. Liver, spleen and kidneys non palpable. Musculoskeletal: Strength 5/5 BUE/BLE. No signs of joint swelling.  Neurological: Alert and oriented.   Psychiatric: Mood and affect normal. Behavior is normal. Judgment and thought content normal.    BMET    Component Value Date/Time   NA 139 08/26/2017 0833   K 4.6 08/26/2017 0833   CL 106 08/26/2017 0833   CO2 27 08/26/2017 0833   GLUCOSE 109 (H) 08/26/2017 0833   BUN 17 08/26/2017 0833   CREATININE 1.05 08/26/2017 0833   CALCIUM 9.3 08/26/2017 0833   GFRNONAA 49 (L) 12/17/2009 0855   GFRAA (L) 12/17/2009 0855    59        The eGFR has been calculated using  the MDRD equation. This calculation has not been validated in all clinical situations. eGFR's persistently <60 mL/min signify possible Chronic Kidney Disease.    Lipid Panel     Component Value Date/Time   CHOL 172 08/26/2017 0833   TRIG 89.0 08/26/2017 0833   HDL 50.30 08/26/2017 0833   CHOLHDL 3 08/26/2017 0833   VLDL 17.8 08/26/2017 0833   LDLCALC 104 (H) 08/26/2017 0833    CBC    Component Value Date/Time   WBC 4.9 04/26/2017 0859   RBC 4.34 04/26/2017 0859   HGB 14.0 04/26/2017 0859   HCT 43.2 04/26/2017 0859   PLT 308.0 04/26/2017 0859   MCV 99.6 04/26/2017 0859   MCHC 32.5 04/26/2017 0859   RDW 13.5 04/26/2017 0859   LYMPHSABS 2.3 12/17/2009 0855   MONOABS 0.7 12/17/2009 0855   EOSABS 0.2 12/17/2009 0855   BASOSABS 0.0 12/17/2009 0855    Hgb A1C No results found for: HGBA1C    Assessment and Plan:   Medicare Annual Wellness Visit:  Diet: She does eat meat.  She consumes some fruits and vegetables.  She does eat fried foods.  She drinks mostly coffee and water. Physical activity: Sedentary Depression/mood screen: Negative Hearing: Intact to whispered voice Visual acuity: Grossly normal ADLs: Capable Fall risk: None Home safety: Good Cognitive evaluation: Intact to orientation, naming, recall and repetition EOL planning: No adv directives, full code/ I agree  Preventative Medicine: Flu, tetanus, Pneumovax and Prevnar UTD. She will try to get Shingrix from her pharmacy.  She no longer wants to screen for Pap smear.  She will call to schedule her mammogram.  Referral placed to GI for colon cancer screening.  Bone density up-to-date.  Encouraged her to consume a balanced diet and exercise regimen.  Advised her to see an eye doctor and dentist annually.  Will check CBC, C met, lipid and vitamin D today.   Next appointment: 6 months, follow-up chronic conditions  Webb Silversmith, NP

## 2018-09-07 ENCOUNTER — Telehealth: Payer: Self-pay | Admitting: *Deleted

## 2018-09-07 NOTE — Assessment & Plan Note (Signed)
C met and lipid profile today Encouraged her to consume a low saturated fat diet Continue Atorvastatin for now

## 2018-09-07 NOTE — Assessment & Plan Note (Signed)
Continue Imitrex as needed We will monitor 

## 2018-09-07 NOTE — Assessment & Plan Note (Signed)
Continue Tylenol as needed 

## 2018-09-07 NOTE — Telephone Encounter (Signed)
Called pt. Advised appt tomorrow cx d/t office closing for inclement weather. Rescheduled to 09/19/18 at 10am. Pt verbalized understanding and appreciation.

## 2018-09-07 NOTE — Assessment & Plan Note (Signed)
Continue omeprazole for now CBC and C met today Advised her to avoid foods that trigger her reflux.

## 2018-09-07 NOTE — Assessment & Plan Note (Signed)
Controlled with Amlodipine Reinforced DASH diet CBC and C met today

## 2018-09-08 ENCOUNTER — Ambulatory Visit: Payer: Medicare Other | Admitting: Neurology

## 2018-09-15 MED ORDER — VITAMIN D (ERGOCALCIFEROL) 1.25 MG (50000 UNIT) PO CAPS: 50000.0000 [IU] | ORAL_CAPSULE | ORAL | 0 refills | Status: DC

## 2018-09-15 NOTE — Addendum Note (Signed)
Addended by: [name] on: 09/15/2018 02:01 PM   Modules accepted: Orders

## 2018-09-15 NOTE — Addendum Note (Signed)
Addended by: Lurlean Nanny on: 09/15/2018 12:08 PM   Modules accepted: Orders

## 2018-09-18 ENCOUNTER — Encounter: Payer: Self-pay | Admitting: Gastroenterology

## 2018-09-19 ENCOUNTER — Encounter: Payer: Self-pay | Admitting: Neurology

## 2018-09-19 ENCOUNTER — Telehealth: Payer: Self-pay | Admitting: Neurology

## 2018-09-19 ENCOUNTER — Ambulatory Visit (INDEPENDENT_AMBULATORY_CARE_PROVIDER_SITE_OTHER): Payer: Medicare Other | Admitting: Neurology

## 2018-09-19 VITALS — BP 110/60 | HR 119 | Wt 105.5 lb

## 2018-09-19 DIAGNOSIS — G43009 Migraine without aura, not intractable, without status migrainosus: Secondary | ICD-10-CM | POA: Diagnosis not present

## 2018-09-19 DIAGNOSIS — R51 Headache: Secondary | ICD-10-CM | POA: Diagnosis not present

## 2018-09-19 DIAGNOSIS — R519 Headache, unspecified: Secondary | ICD-10-CM

## 2018-09-19 DIAGNOSIS — M47812 Spondylosis without myelopathy or radiculopathy, cervical region: Secondary | ICD-10-CM

## 2018-09-19 DIAGNOSIS — R1013 Epigastric pain: Secondary | ICD-10-CM

## 2018-09-19 DIAGNOSIS — M542 Cervicalgia: Secondary | ICD-10-CM

## 2018-09-19 MED ORDER — SUMATRIPTAN SUCCINATE 100 MG PO TABS: ORAL_TABLET | ORAL | 11 refills | Status: DC

## 2018-09-19 MED ORDER — ONDANSETRON HCL 4 MG PO TABS: 4.0000 mg | ORAL_TABLET | Freq: Three times a day (TID) | ORAL | 5 refills | Status: DC | PRN

## 2018-09-19 MED ORDER — LEVETIRACETAM 500 MG PO TABS: 500.0000 mg | ORAL_TABLET | Freq: Two times a day (BID) | ORAL | 11 refills | Status: DC

## 2018-09-19 MED ORDER — BUTALBITAL-APAP-CAFFEINE 50-325-40 MG PO TABS: ORAL_TABLET | ORAL | 5 refills | Status: DC

## 2018-09-19 NOTE — Telephone Encounter (Signed)
Medicare/[place]ual of [place] order sent to GI. No auth they will reach out to the pt to schedule.  °

## 2018-09-19 NOTE — Progress Notes (Signed)
[facility]  PATIENT: Teresa Lozano DOB: 19-Jul-1951  REFERRING DOCTOR OR PCP:  Webb Silversmith SOURCE: patient, notes from PCP  _________________________________   HISTORICAL  CHIEF COMPLAINT:  Chief Complaint  Patient presents with  . Follow-up    RM 12, alone. Last seen 03/08/18. Recently was told GFR low and going to see specialist for this. Not yet scheduled for appt, pending.  Marland Kitchen Headache    Not having migraines, more tension headaches. They are not any better since last visit.    HISTORY OF PRESENT ILLNESS:  Teresa Lozano is a 68 y.o. woman who has had headaches for 40 years.    Update 09/19/2018: She reports her headaches are doing about the same.   Most of them are occipital and shoot up an inch or so from her neck.     Sometimes she gets nausea but not vomiting.    Moving does not alter pain much.      She is on imipramine 75 mg at bedtime and tolerates that well.     Recent blood work showed mild reduced renal function with GFR in the 50's and Vit D was low.     CT has shown severe left facet changes at [facility] in 2011 and much milder facet hypertrophy to the right at [facility].   She has ACDF at [facility] (Dr. Ronnald Ramp 2011)  She had kidney stones so TPM and zonisamide should not be used    Tizanidine caused nausea.   We discussed trying not to take NSAID's due to kidneys (also had PUD in past).    Update 03/08/2018: She has headaches 4-5 days a week (16-20 days/month) lasting at least 4 hours a day.   About 1 a week turns into a migraine.   The more frequent headaches are in the occiput.    Occipital nerve blocks have sometimes helped but she almost lost consciousness with the last one and  felt sick the rest of the day.     Most HA's are tension type in the occiput.  She takes Excedrin and takes about 12/week.  If worse, she takes Fioricet (no more than 4/week).   Tylenol has not helped.    For the migraines with pounding frontal pain and nausea/photophobia/phonophobia  she will take Imitrex and that usually helps.       She is on imipramine 75 mg nightly as a prophylactic agent.    She can't take Topiramate or zonisamide due to kidney stones.   Due to ulcers, she can't take NSAIDs.   Gabapentin was once tried but she does not recall if it helped any.    Update 10/06/2017: She is experiencing migraines about 2-3 days a week.    She is doing PT for her left shoulder and the therapy increases her pain.    A lot of the headaches are predominantly occipital    Many years ago, when she had daily headaches, she got some benefit from occipital nerve blocks.   She is currently in imipramine 75 mg nightly.     Imitrex sometimes helps when a HA occurs, more for the frontal HA than for the occipital ones.   Fioricet (2 pills) helps but she is limited in quantity to prevent rebound.   Due to ulcers, she can't take NSAID's.   Due to kidney stones, she can't take Topamax or Zonegran.   She gets frequent epigastric pain still.   From 07/08/2017: She is currently having 9-12 headaches a month, tension  and migraine.     Her tension type HA's are mostly in her neck.  With these headaches, pain is achy and more constant  She lays on a heating pad with benefit.     Moving increases the pain.    She takes Excedrin when a HA is bad.   Due to a severe ulcer (needed 40% stomach removed), she is advised not to take any NSAID.   She also has migraines 4-5  a month with pounding pain behind her eye, nausea, photophobia and phonophobia.   Moving worsens the headaches.   Excedrin sometimes helps.   She has not taken a triptan in over 5 years and used to get some benefit with Imitrex.  I last saw her 5 years ago at a different practice.   After that she lost insurance and stopped her medications.   She had some benefit from a combination of a beta blocker, imipramine and Imitrex and Fioricet for breakthrough   Additionally, for the past 45 months she has had a lot more pain in the left shoulder. Pain  is worse with external rotation and elevation. Pain is also worse if she lays on that side. Slightly helps. She denies any weakness in the arms or legs.  Medication history:    She had some benefit for her migraines with imipramine and Inderal.   She has not tried Topiramate.    Medical issues:   History of gastric surgery for ulcer and cervical fusion                                                                                                                                                                             REVIEW OF SYSTEMS: Constitutional: No fevers, chills, sweats, or change in appetite Eyes: No visual changes, double vision, eye pain Ear, nose and throat: No hearing loss, ear pain, nasal congestion, sore throat Cardiovascular: No chest pain, palpitations Respiratory: No shortness of breath at rest or with exertion.   No wheezes GastrointestinaI: No nausea, vomiting, diarrhea, abdominal pain, fecal incontinence Genitourinary: No dysuria, urinary retention or frequency.  No nocturia. Musculoskeletal: No neck pain, back pain Integumentary: No rash, pruritus, skin lesions Neurological: as above Psychiatric: No depression at this time.  No anxiety Endocrine: No palpitations, diaphoresis, change in appetite, change in weigh or increased thirst Hematologic/Lymphatic: No anemia, purpura, petechiae. Allergic/Immunologic: No itchy/runny eyes, nasal congestion, recent allergic reactions, rashes  ALLERGIES: Allergies  Allergen Reactions  . Robaxin [Methocarbamol] Nausea And Vomiting  . Lisinopril Cough    HOME MEDICATIONS:  Current Outpatient Medications:  .  acetaminophen (TYLENOL) 650 MG CR tablet, Take 1,300 mg by mouth 2 (two) times daily., Disp: , Rfl:  .  amLODipine (NORVASC) 10 MG tablet, Take 1 tablet (10 mg total) by mouth daily., Disp: 90 tablet, Rfl: 0 .  atorvastatin (LIPITOR) 20 MG tablet, Take 1 tablet (20 mg total) by mouth daily., Disp: 90 tablet, Rfl:  0 .  butalbital-acetaminophen-caffeine (FIORICET, ESGIC) 50-325-40 MG tablet, 1 to 2 po prn headache.   No more than 16 pills a month, Disp: 16 tablet, Rfl: 5 .  cetirizine (ZYRTEC) 10 MG tablet, Take 10 mg by mouth daily., Disp: , Rfl:  .  imipramine (TOFRANIL) 25 MG tablet, Take 3 tablets (75 mg total) by mouth at bedtime., Disp: 270 tablet, Rfl: 3 .  Magnesium 500 MG CAPS, Take 500 mg by mouth 2 (two) times a week. , Disp: , Rfl:  .  omeprazole (PRILOSEC) 40 MG capsule, Take 1 capsule (40 mg total) by mouth daily., Disp: 60 capsule, Rfl: 5 .  ondansetron (ZOFRAN) 4 MG tablet, Take 1 tablet (4 mg total) by mouth every 8 (eight) hours as needed., Disp: 20 tablet, Rfl: 5 .  polyethylene glycol (MIRALAX / GLYCOLAX) packet, Take 17 g by mouth daily., Disp: , Rfl:  .  SUMAtriptan (IMITREX) 100 MG tablet, TAKE 1 TAB AS NEEDED FOR MIGRAINE. MAY REPEAT IN 2 HRS IF HEADACHE PERSISTS OR RECURS. DNF 6/19, Disp: 10 tablet, Rfl: 11 .  tamsulosin (FLOMAX) 0.4 MG CAPS capsule, Take 1 capsule (0.4 mg total) by mouth daily as needed (kidney stone)., Disp: 15 capsule, Rfl: 1 .  Vitamin D, Ergocalciferol, (DRISDOL) 1.25 MG (50000 UT) CAPS capsule, Take 1 capsule (50,000 Units total) by mouth every 7 (seven) days., Disp: 12 capsule, Rfl: 0 .  levETIRAcetam (KEPPRA) 500 MG tablet, Take 1 tablet (500 mg total) by mouth 2 (two) times daily., Disp: 60 tablet, Rfl: 11  PAST MEDICAL HISTORY: Past Medical History:  Diagnosis Date  . Allergy   . Arthritis   . Chicken pox   . Frequent headaches   . GERD (gastroesophageal reflux disease)   . History of kidney stones   . Hyperlipidemia   . Hypertension   . Osteoporosis     PAST SURGICAL HISTORY: Past Surgical History:  Procedure Laterality Date  . CERVICAL FUSION  2000  . ESOPHAGOGASTRODUODENOSCOPY  11/2016  . ESOPHAGOGASTRODUODENOSCOPY (EGD) WITH PROPOFOL N/A 12/28/2016   Procedure: ESOPHAGOGASTRODUODENOSCOPY (EGD) WITH PROPOFOL;  Surgeon: Manus Gunning, MD;  Location: [facility];  Service: Gastroenterology;  Laterality: N/A;  . EYE SURGERY    . TONSILLECTOMY      FAMILY HISTORY: Family History  Problem Relation Age of Onset  . Hyperlipidemia Mother   . Hypertension Mother   . Prostate cancer Father   . Hyperlipidemia Father   . Heart disease Father   . Heart disease Brother   . Hypertension Sister   . Hypertension Maternal Grandmother   . Colon cancer Neg Hx     SOCIAL HISTORY:  Social History   Socioeconomic History  . Marital status: Widowed    Spouse name: Not on file  . Number of children: Not on file  . Years of education: Not on file  . Highest education level: Not on file  Occupational History  . Not on file  Social Needs  . Financial resource strain: Not on file  . Food insecurity:    Worry: Not on file    Inability: Not on file  . Transportation needs:    Medical: Not on file    Non-medical: Not on file  Tobacco Use  . Smoking status:  Never Smoker  . Smokeless tobacco: Never Used  Substance and Sexual Activity  . Alcohol use: No    Alcohol/week: 0.0 standard drinks  . Drug use: No  . Sexual activity: Never  Lifestyle  . Physical activity:    Days per week: Not on file    Minutes per session: Not on file  . Stress: Not on file  Relationships  . Social connections:    Talks on phone: Not on file    Gets together: Not on file    Attends religious service: Not on file    Active member of club or organization: Not on file    Attends meetings of clubs or organizations: Not on file    Relationship status: Not on file  . Intimate partner violence:    Fear of current or ex partner: Not on file    Emotionally abused: Not on file    Physically abused: Not on file    Forced sexual activity: Not on file  Other Topics Concern  . Not on file  Social History Narrative  . Not on file     PHYSICAL EXAM  Vitals:   09/19/18 0946  BP: 110/60  Pulse: (!) 119  SpO2: 98%  Weight: 105 lb 8 oz  (47.9 kg)    Body mass index is 19.3 kg/m.   General: The patient is well-developed and well-nourished and in no acute distress  Musculoskeletal: She is tender over the upper cervical paraspinal muscles and moderate tenderness over the left > right occiput/splenius capitis/occipital nerve.  Neurologic Exam  Mental status: The patient is alert and oriented x 3 at the time of the examination. The patient has apparent normal recent and remote memory, with an apparently normal attention span and concentration ability.   Speech is normal.  Cranial nerves: Extraocular muscles are intact.  Facial strength and sensation is normal.  Trapezius strength is normal. . No obvious hearing deficits are noted.  Motor:  Muscle bulk is normal.   Tone is normal. Strength is  5 / 5 in all 4 extremities.   Sensory: Sensory testing is intact to touch  in all 4 extremities.  Coordination: She has good finger-nose-finger.  Gait and station: Station is normal.   Gait is normal. Tandem gait is norm  Reflexes: Deep tendon reflexes are symmetric and normal bilaterally.      DIAGNOSTIC DATA (LABS, IMAGING, TESTING) - I reviewed patient records, labs, notes, testing and imaging myself where available.  Lab Results  Component Value Date   WBC 5.4 09/05/2018   HGB 12.2 09/05/2018   HCT 38.1 09/05/2018   MCV 93.3 09/05/2018   PLT 277.0 09/05/2018      Component Value Date/Time   NA 142 09/05/2018 0956   K 5.0 09/05/2018 0956   CL 108 09/05/2018 0956   CO2 29 09/05/2018 0956   GLUCOSE 89 09/05/2018 0956   BUN 20 09/05/2018 0956   CREATININE 1.08 09/05/2018 0956   CALCIUM 9.0 09/05/2018 0956   PROT 5.8 (L) 09/05/2018 0956   ALBUMIN 3.6 09/05/2018 0956   AST 19 09/05/2018 0956   ALT 16 09/05/2018 0956   ALKPHOS 103 09/05/2018 0956   BILITOT 0.2 09/05/2018 0956   GFRNONAA 49 (L) 12/17/2009 0855   GFRAA (L) 12/17/2009 0855    59        The eGFR has been calculated using the MDRD equation. This  calculation has not been validated in all clinical situations. eGFR's persistently <60 mL/min signify possible  Chronic Kidney Disease.   Lab Results  Component Value Date   CHOL 187 09/05/2018   HDL 51.30 09/05/2018   LDLCALC 120 (H) 09/05/2018   TRIG 79.0 09/05/2018   CHOLHDL 4 09/05/2018      ASSESSMENT AND PLAN  Frequent headaches  Cervicalgia - Plan: MR CERVICAL SPINE WO CONTRAST  Epigastric pain - Plan: ondansetron (ZOFRAN) 4 MG tablet  Spondylosis of cervical region without myelopathy or radiculopathy  Cervical facet joint syndrome  Common migraine without intractability   1.   Continue imipramine 75 mg nightly.  Add Keppra 500 mg bid for HA.  . 2.    Renew Imitrex prn migraine.  Can also take Fioricet prn tension type - no more than 16/month. 3.    MRI cervical spine for suspected upper cervical facet syndrome -- based on results consider referral for facet blocks/RF. 4.    RTC 6 months   Richard A. Felecia Shelling, MD, [facility] 0/08/5850, 77:82 AM Certified in Neurology, Clinical Neurophysiology, Sleep Medicine, Pain Medicine and Neuroimaging  [facility] [address] [place], [place] [address] [phone]

## 2018-09-30 ENCOUNTER — Other Ambulatory Visit: Payer: Self-pay

## 2018-09-30 ENCOUNTER — Ambulatory Visit
Admission: RE | Admit: 2018-09-30 | Discharge: 2018-09-30 | Disposition: A | Discharge status: Home / Self Care | Payer: Medicare Other | Source: Ambulatory Visit | Attending: Neurology | Admitting: Neurology

## 2018-09-30 DIAGNOSIS — M542 Cervicalgia: Secondary | ICD-10-CM | POA: Diagnosis not present

## 2018-10-02 ENCOUNTER — Other Ambulatory Visit: Payer: Self-pay

## 2018-10-02 ENCOUNTER — Ambulatory Visit (AMBULATORY_SURGERY_CENTER): Payer: Self-pay | Admitting: *Deleted

## 2018-10-02 ENCOUNTER — Telehealth: Payer: Self-pay | Admitting: *Deleted

## 2018-10-02 VITALS — Ht 62.0 in | Wt 105.2 lb

## 2018-10-02 DIAGNOSIS — Z1211 Encounter for screening for malignant neoplasm of colon: Secondary | ICD-10-CM

## 2018-10-02 MED ORDER — SUPREP BOWEL PREP KIT 17.5-3.13-1.6 GM/177ML PO SOLN: 1.0000 | Freq: Once | ORAL | 0 refills | Status: AC

## 2018-10-02 NOTE — Telephone Encounter (Signed)
-----   Message from Britt Bottom, MD sent at 09/30/2018  7:32 PM EDT ----- Please let the patient know that the MRI shows some degenerative changes above her fusion but they are mild and should not cause any nerve root compression

## 2018-10-02 NOTE — Progress Notes (Signed)
No egg or soy allergy known to patient  No issues with past sedation with any surgeries  or procedures, no intubation problems  No diet pills per patient No home 02 use per patient  No blood thinners per patient  Pt denies issues with constipation presently, patient does take miralax as needed No A fib or A flutter  EMMI video  Offered and denied by the patient temp 97.5 Patient will take Miralax dosage on Saturay 3/28 and Sunday 3/29 in addition to her prep, patient verbalized understanding and agreement with plan.

## 2018-10-02 NOTE — Telephone Encounter (Signed)
Called and spoke with pt about results per Dr. Sater note. Pt verbalized understanding. 

## 2018-10-11 ENCOUNTER — Telehealth: Payer: Self-pay | Admitting: *Deleted

## 2018-10-11 NOTE — Telephone Encounter (Signed)
LM on VM to call back re: cancellation d/t covid 19

## 2018-10-16 ENCOUNTER — Encounter: Payer: Medicare Other | Admitting: Gastroenterology

## 2018-10-31 ENCOUNTER — Other Ambulatory Visit: Payer: Self-pay | Admitting: Internal Medicine

## 2018-11-08 ENCOUNTER — Other Ambulatory Visit: Payer: Self-pay | Admitting: Neurology

## 2018-11-22 ENCOUNTER — Telehealth: Payer: Self-pay | Admitting: *Deleted

## 2018-11-22 NOTE — Telephone Encounter (Signed)
Called to reschedule postponed procedure.  Rescheduled for 5/27 @ 300.  Will send new prep instructions via My Chart.

## 2018-11-24 ENCOUNTER — Other Ambulatory Visit: Payer: Self-pay | Admitting: Family Medicine

## 2018-11-24 NOTE — Telephone Encounter (Signed)
This was last filled 03/2018... please advise

## 2018-12-01 ENCOUNTER — Other Ambulatory Visit: Payer: Self-pay | Admitting: Internal Medicine

## 2018-12-01 DIAGNOSIS — E559 Vitamin D deficiency, unspecified: Secondary | ICD-10-CM

## 2018-12-12 ENCOUNTER — Telehealth: Payer: Self-pay | Admitting: *Deleted

## 2018-12-12 NOTE — Telephone Encounter (Signed)
Covid-19 travel screening questions  Have you traveled in the last 14 days? no If yes where?  Do you now or have you had a fever in the last 14 days? no  Do you have any respiratory symptoms of shortness of breath or cough now or in the last 14 days? no  Do you have any family members or close contacts with diagnosed or suspected Covid-19? No  Pt is aware that her care partner will be waiting in the car during her procedure.  She will need a mask when she gets here

## 2018-12-13 ENCOUNTER — Encounter: Payer: Self-pay | Admitting: Gastroenterology

## 2018-12-13 ENCOUNTER — Ambulatory Visit (AMBULATORY_SURGERY_CENTER): Payer: Medicare Other | Admitting: Gastroenterology

## 2018-12-13 ENCOUNTER — Other Ambulatory Visit: Payer: Self-pay

## 2018-12-13 VITALS — BP 109/61 | HR 87 | Temp 97.7°F | Resp 23 | Ht 62.0 in | Wt 105.0 lb

## 2018-12-13 DIAGNOSIS — Z538 Procedure and treatment not carried out for other reasons: Secondary | ICD-10-CM | POA: Diagnosis not present

## 2018-12-13 DIAGNOSIS — Z1211 Encounter for screening for malignant neoplasm of colon: Secondary | ICD-10-CM | POA: Diagnosis not present

## 2018-12-13 MED ORDER — SODIUM CHLORIDE 0.9 % IV SOLN: 500.0000 mL | Freq: Once | INTRAVENOUS | Status: DC

## 2018-12-13 NOTE — Progress Notes (Signed)
PT taken to PACU. Monitors in place. VSS. Report given to RN. 

## 2018-12-13 NOTE — Progress Notes (Signed)
Pt's states no medical or surgical changes since previsit or office visit. Covid screening and temp done by [facility]. Vitals done by [employer]

## 2018-12-13 NOTE — Op Note (Signed)
[place] Patient Name: Teresa Lozano Procedure Date: 12/13/2018 3:43 PM MRN: [id number] Endoscopist: Remo Lipps P. Havery Moros , MD Age: 68 Referring MD:  Date of Birth: Aug 22, 1950 Gender: Female Account #: [id number] Procedure:                Colonoscopy Indications:              Screening for colorectal malignant neoplasm Medicines:                Monitored Anesthesia Care Procedure:                Pre-Anesthesia Assessment:                           - Prior to the procedure, a History and Physical                            was performed, and patient medications and                            allergies were reviewed. The patient's tolerance of                            previous anesthesia was also reviewed. The risks                            and benefits of the procedure and the sedation                            options and risks were discussed with the patient.                            All questions were answered, and informed consent                            was obtained. Prior Anticoagulants: The patient has                            taken no previous anticoagulant or antiplatelet                            agents. ASA Grade Assessment: II - A patient with                            mild systemic disease. After reviewing the risks                            and benefits, the patient was deemed in                            satisfactory condition to undergo the procedure.                           After obtaining informed consent, the colonoscope  was passed under direct vision. Throughout the                            procedure, the patient's blood pressure, pulse, and                            oxygen saturations were monitored continuously. The                            Model PCF-[id number]) scope was introduced                            through the anus with the intention of advancing to                            the  cecum. The scope was advanced to the transverse                            colon before the procedure was aborted. Medications                            were given. The colonoscopy was technically                            difficult and complex due to inadequate bowel prep.                            The patient tolerated the procedure well. The                            quality of the bowel preparation was poor. The                            rectum was photographed. Scope In: Scope Out: Findings:                 The perianal and digital rectal examinations were                            normal.                           A large amount of semi-liquid stool was found in                            the entire colon, interfering with visualization.                            The preparation was inadequate for screening                            purposes. Extent reached I suspect somewhere in the  transverse colon where the prep worsened and cecal                            intubation could not safely be performed. The                            procedure was then aborted. No obvious polyps or                            mass lesions of colonic mucosa that was seen. Complications:            No immediate complications. Estimated blood loss:                            None. Estimated Blood Loss:     Estimated blood loss: none. Impression:               - Preparation of the colon was poor.                           - Stool in the entire examined colon.                           - Procedure aborted Recommendation:           - Patient has a contact number available for                            emergencies. The signs and symptoms of potential                            delayed complications were discussed with the                            patient. Return to normal activities tomorrow.                            Written discharge instructions were provided to the                             patient.                           - Resume previous diet.                           - Continue present medications.                           - Repeat colonoscopy because the bowel preparation                            was suboptimal using 2 day prep, versus                            consideration for stool based testing to complete  screening, will discuss options with the patient. Remo Lipps P. Armbruster, MD 12/13/2018 4:07:05 PM This report has been signed electronically.

## 2018-12-13 NOTE — Progress Notes (Signed)
Unable to complete procedure due to poor prep.  Patient requested to get up and go to restroom as soon as she was in recovery.  Patient is alert and oriented and able to walk herself to restroom.  Only one set of vitals obtained in recovery.  Dr. Havery Moros and Kassie Mends, CRNA are aware of patient coming off monitor early.

## 2018-12-13 NOTE — Patient Instructions (Signed)
You have been rescheduled for repeat colonoscopy.  Please see your upcoming appointments.   YOU HAD AN ENDOSCOPIC PROCEDURE TODAY AT [facility]:   Refer to the procedure report that was given to you for any specific questions about what was found during the examination.  If the procedure report does not answer your questions, please call your gastroenterologist to clarify.  If you requested that your care partner not be given the details of your procedure findings, then the procedure report has been included in a sealed envelope for you to review at your convenience later.  YOU SHOULD EXPECT: Some feelings of bloating in the abdomen. Passage of more gas than usual.  Walking can help get rid of the air that was put into your GI tract during the procedure and reduce the bloating. If you had a lower endoscopy (such as a colonoscopy or flexible sigmoidoscopy) you may notice spotting of blood in your stool or on the toilet paper. If you underwent a bowel prep for your procedure, you may not have a normal bowel movement for a few days.  Please Note:  You might notice some irritation and congestion in your nose or some drainage.  This is from the oxygen used during your procedure.  There is no need for concern and it should clear up in a day or so.  SYMPTOMS TO REPORT IMMEDIATELY:   Following lower endoscopy (colonoscopy or flexible sigmoidoscopy):  Excessive amounts of blood in the stool  Significant tenderness or worsening of abdominal pains  Swelling of the abdomen that is new, acute  Fever of 100F or higher  For urgent or emergent issues, a gastroenterologist can be reached at any hour by calling [phone].   DIET:  We do recommend a small meal at first, but then you may proceed to your regular diet.  Drink plenty of fluids but you should avoid alcoholic beverages for 24 hours.  ACTIVITY:  You should plan to take it easy for the rest of today and you should NOT DRIVE or use  heavy machinery until tomorrow (because of the sedation medicines used during the test).    FOLLOW UP: Our staff will call the number listed on your records 48-72 hours following your procedure to check on you and address any questions or concerns that you may have regarding the information given to you following your procedure. If we do not reach you, we will leave a message.  We will attempt to reach you two times.  During this call, we will ask if you have developed any symptoms of COVID 19. If you develop any symptoms (ie: fever, flu-like symptoms, shortness of breath, cough etc.) before then, please call [phone].  If you test positive for Covid 19 in the 2 weeks post procedure, please call and report this information to [place].    If any biopsies were taken you will be contacted by phone or by letter within the next 1-3 weeks.  Please call us at [phone] if you have not heard about the biopsies in 3 weeks.    SIGNATURES/CONFIDENTIALITY: You and/or your care partner have signed paperwork which will be entered into your electronic medical record.  These signatures attest to the fact that that the information above on your After Visit Summary has been reviewed and is understood.  Full responsibility of the confidentiality of this discharge information lies with you and/or your care-partner.

## 2018-12-15 ENCOUNTER — Telehealth: Payer: Self-pay

## 2018-12-15 ENCOUNTER — Telehealth: Payer: Self-pay | Admitting: *Deleted

## 2018-12-15 NOTE — Telephone Encounter (Signed)
  Follow up Call-  Call back number 12/13/2018 11/09/2016  Post procedure Call Back phone  # [phone]  Permission to leave phone message Yes Yes  Some recent data might be hidden     Patient questions:  Do you have a fever, pain , or abdominal swelling? No. Pain Score  0 *  Have you tolerated food without any problems? Yes.    Have you been able to return to your normal activities? Yes.    Do you have any questions about your discharge instructions: Diet   No. Medications  No. Follow up visit  No.  Do you have questions or concerns about your Care? No.  Actions: * If pain score is 4 or above: No action needed, pain <4.  1. Have you developed a fever since your procedure? No  2.   Have you had an respiratory symptoms (SOB or cough) since your procedure? No  3.   Have you tested positive for COVID 19 since your procedure  No  4.   Have you had any family members/close contacts diagnosed with the COVID 19 since your procedure?  No   If yes to any of these questions please route to Joylene John, RN and Alphonsa Gin, RN.

## 2018-12-15 NOTE — Telephone Encounter (Signed)
Left detailed VM w COVID screen and curbside info   

## 2018-12-19 ENCOUNTER — Other Ambulatory Visit (INDEPENDENT_AMBULATORY_CARE_PROVIDER_SITE_OTHER): Payer: Medicare Other

## 2018-12-19 DIAGNOSIS — E559 Vitamin D deficiency, unspecified: Secondary | ICD-10-CM

## 2018-12-19 LAB — VITAMIN D 25 HYDROXY (VIT D DEFICIENCY, FRACTURES): VITD: 57.37 ng/mL (ref 30.00–100.00)

## 2019-01-02 ENCOUNTER — Other Ambulatory Visit: Payer: Self-pay

## 2019-01-02 ENCOUNTER — Ambulatory Visit: Payer: Medicare Other | Admitting: *Deleted

## 2019-01-02 VITALS — Ht 62.0 in | Wt 103.0 lb

## 2019-01-02 DIAGNOSIS — Z1211 Encounter for screening for malignant neoplasm of colon: Secondary | ICD-10-CM

## 2019-01-02 MED ORDER — SUPREP BOWEL PREP KIT 17.5-3.13-1.6 GM/177ML PO SOLN: 1.0000 | Freq: Once | ORAL | 0 refills | Status: AC

## 2019-01-02 NOTE — Progress Notes (Signed)
No egg or soy allergy known to patient  No issues with past sedation with any surgeries  or procedures, no intubation problems  No diet pills per patient No home 02 use per patient  No blood thinners per patient  Pt denies issues with constipation  No A fib or A flutter  EMMI video sent to pt's e mail    Pt verified name, DOB, address and insurance during PV today. Pt mailed instruction packet to included paper to complete and mail back to [facility] with addressed and stamped envelope, Emmi video, copy of consent form to read and not return, and instructions. PV completed over the phone. Pt encouraged to call with questions or issues - pnm $50 COUPON TO PT FOR SUPREP    Pt is aware that care partner will wait in the car during parking lot; if they feel like they will be too hot to wait in the car; they may wait in the lobby.  We want them to wear a mask (we do not have any that we can provide them), practice social distancing, and we will check their temperatures when they get here.  I did remind patient that their care partner needs to stay in the parking lot the entire time. Pt will wear mask into building

## 2019-01-04 DIAGNOSIS — I129 Hypertensive chronic kidney disease with stage 1 through stage 4 chronic kidney disease, or unspecified chronic kidney disease: Secondary | ICD-10-CM | POA: Diagnosis not present

## 2019-01-04 DIAGNOSIS — R809 Proteinuria, unspecified: Secondary | ICD-10-CM | POA: Diagnosis not present

## 2019-01-04 DIAGNOSIS — N183 Chronic kidney disease, stage 3 (moderate): Secondary | ICD-10-CM | POA: Diagnosis not present

## 2019-01-09 ENCOUNTER — Other Ambulatory Visit: Payer: Self-pay | Admitting: Nephrology

## 2019-01-09 DIAGNOSIS — N183 Chronic kidney disease, stage 3 unspecified: Secondary | ICD-10-CM

## 2019-01-11 ENCOUNTER — Telehealth: Payer: Self-pay | Admitting: Gastroenterology

## 2019-01-11 ENCOUNTER — Other Ambulatory Visit: Payer: Self-pay

## 2019-01-11 MED ORDER — NA SULFATE-K SULFATE-MG SULF 17.5-3.13-1.6 GM/177ML PO SOLN: 1.0000 | Freq: Once | ORAL | 0 refills | Status: AC

## 2019-01-11 NOTE — Telephone Encounter (Signed)
Called patient back. Her Pharmacy stated they couldn't use the $50 coupon with Medicare. Instructed patient to fill it at another Pharmacy as Self-Pay and she could use the coupon. Resent the order to [facility] (per patient request)

## 2019-01-11 NOTE — Progress Notes (Signed)
Su-pr

## 2019-01-17 ENCOUNTER — Telehealth: Payer: Self-pay | Admitting: Gastroenterology

## 2019-01-17 NOTE — Telephone Encounter (Signed)
Patient called back and answer no to all questions.

## 2019-01-17 NOTE — Telephone Encounter (Signed)
Left message to call back to ask Covid-19 screening questions. Covid-19 Screening Questions:  Do you now or have you had a fever in the last 14 days? no  Do you have any respiratory symptoms of shortness of breath or cough now or in the last 14 days? no  Do you have any family members or close contacts with diagnosed or suspected Covid-19 in the past 14 days? no  Have you been tested for Covid-19 and found to be positive? no  .

## 2019-01-18 ENCOUNTER — Other Ambulatory Visit: Payer: Self-pay

## 2019-01-18 ENCOUNTER — Encounter: Payer: Self-pay | Admitting: Gastroenterology

## 2019-01-18 ENCOUNTER — Ambulatory Visit: Payer: Medicare Other

## 2019-01-18 ENCOUNTER — Ambulatory Visit (AMBULATORY_SURGERY_CENTER): Payer: Medicare Other | Admitting: Gastroenterology

## 2019-01-18 VITALS — BP 103/59 | HR 81 | Temp 98.5°F | Resp 17 | Ht 62.0 in | Wt 105.0 lb

## 2019-01-18 DIAGNOSIS — D125 Benign neoplasm of sigmoid colon: Secondary | ICD-10-CM

## 2019-01-18 DIAGNOSIS — Z1211 Encounter for screening for malignant neoplasm of colon: Secondary | ICD-10-CM

## 2019-01-18 DIAGNOSIS — K635 Polyp of colon: Secondary | ICD-10-CM

## 2019-01-18 HISTORY — PX: COLONOSCOPY: SHX174

## 2019-01-18 MED ORDER — SODIUM CHLORIDE 0.9 % IV SOLN: 500.0000 mL | Freq: Once | INTRAVENOUS | Status: DC

## 2019-01-18 NOTE — Op Note (Signed)
[facility] Patient Name: Teresa Lozano Procedure Date: 01/18/2019 9:37 AM MRN: [id number] Endoscopist: Remo Lipps P. Havery Moros , MD Age: 68 Referring MD:  Date of Birth: 07-08-1951 Gender: Female Account #: [id number] Procedure:                Colonoscopy Indications:              Screening for colorectal malignant neoplasm, double                            prep, last exam limited by poor prep Medicines:                Monitored Anesthesia Care Procedure:                Pre-Anesthesia Assessment:                           - Prior to the procedure, a History and Physical                            was performed, and patient medications and                            allergies were reviewed. The patient's tolerance of                            previous anesthesia was also reviewed. The risks                            and benefits of the procedure and the sedation                            options and risks were discussed with the patient.                            All questions were answered, and informed consent                            was obtained. Prior Anticoagulants: The patient has                            taken no previous anticoagulant or antiplatelet                            agents. ASA Grade Assessment: II - A patient with                            mild systemic disease. After reviewing the risks                            and benefits, the patient was deemed in                            satisfactory condition to undergo the procedure.  After obtaining informed consent, the colonoscope                            was passed under direct vision. Throughout the                            procedure, the patient's blood pressure, pulse, and                            oxygen saturations were monitored continuously. The                            Colonoscope was introduced through the anus and                            advanced to the  the cecum, identified by                            appendiceal orifice and ileocecal valve. The                            colonoscopy was technically difficult and complex                            due to a tortuous colon. The patient tolerated the                            procedure well. The quality of the bowel                            preparation was adequate. The ileocecal valve,                            appendiceal orifice, and rectum were photographed. Scope In: 9:42:58 AM Scope Out: 10:09:34 AM Scope Withdrawal Time: 0 hours 15 minutes 29 seconds  Total Procedure Duration: 0 hours 26 minutes 36 seconds  Findings:                 The perianal and digital rectal examinations were                            normal.                           A 2 mm polyp was found in the sigmoid colon. The                            polyp was sessile. The polyp was removed with a                            cold biopsy forceps. Resection and retrieval were                            complete.  A few small-mouthed diverticula were found in the                            sigmoid colon.                           The colon was significantly tortuous, difficult                            cecal intubation. Prep took some time to lavage to                            achieve adequate views.                           The exam was otherwise without abnormality.                            Retroflexed views of the rectum not obtained due to                            small size of the rectum. Complications:            No immediate complications. Estimated blood loss:                            Minimal. Estimated Blood Loss:     Estimated blood loss was minimal. Impression:               - One 2 mm polyp in the sigmoid colon, removed with                            a cold biopsy forceps. Resected and retrieved.                           - Diverticulosis in the sigmoid colon.                            - Tortuous colon.                           - The examination was otherwise normal. Recommendation:           - Patient has a contact number available for                            emergencies. The signs and symptoms of potential                            delayed complications were discussed with the                            patient. Return to normal activities tomorrow.                            Written discharge instructions were provided to the  patient.                           - Resume previous diet.                           - Continue present medications.                           - Await pathology results. Remo Lipps P. Banjamin Stovall, MD 01/18/2019 10:14:34 AM This report has been signed electronically.

## 2019-01-18 NOTE — Progress Notes (Signed)
Called to room to assist during endoscopic procedure.  Patient ID and intended procedure confirmed with present staff. Received instructions for my participation in the procedure from the performing physician.  

## 2019-01-18 NOTE — Patient Instructions (Signed)
Handout on polyps, diverticulosis given.   YOU HAD AN ENDOSCOPIC PROCEDURE TODAY AT [facility]:   Refer to the procedure report that was given to you for any specific questions about what was found during the examination.  If the procedure report does not answer your questions, please call your gastroenterologist to clarify.  If you requested that your care partner not be given the details of your procedure findings, then the procedure report has been included in a sealed envelope for you to review at your convenience later.  YOU SHOULD EXPECT: Some feelings of bloating in the abdomen. Passage of more gas than usual.  Walking can help get rid of the air that was put into your GI tract during the procedure and reduce the bloating. If you had a lower endoscopy (such as a colonoscopy or flexible sigmoidoscopy) you may notice spotting of blood in your stool or on the toilet paper. If you underwent a bowel prep for your procedure, you may not have a normal bowel movement for a few days.  Please Note:  You might notice some irritation and congestion in your nose or some drainage.  This is from the oxygen used during your procedure.  There is no need for concern and it should clear up in a day or so.  SYMPTOMS TO REPORT IMMEDIATELY:   Following lower endoscopy (colonoscopy or flexible sigmoidoscopy):  Excessive amounts of blood in the stool  Significant tenderness or worsening of abdominal pains  Swelling of the abdomen that is new, acute  Fever of 100F or higher  For urgent or emergent issues, a gastroenterologist can be reached at any hour by calling [phone].   DIET:  We do recommend a small meal at first, but then you may proceed to your regular diet.  Drink plenty of fluids but you should avoid alcoholic beverages for 24 hours.  ACTIVITY:  You should plan to take it easy for the rest of today and you should NOT DRIVE or use heavy machinery until tomorrow (because of the  sedation medicines used during the test).    FOLLOW UP: Our staff will call the number listed on your records 48-72 hours following your procedure to check on you and address any questions or concerns that you may have regarding the information given to you following your procedure. If we do not reach you, we will leave a message.  We will attempt to reach you two times.  During this call, we will ask if you have developed any symptoms of COVID 19. If you develop any symptoms (ie: fever, flu-like symptoms, shortness of breath, cough etc.) before then, please call [phone].  If you test positive for Covid 19 in the 2 weeks post procedure, please call and report this information to [place].    If any biopsies were taken you will be contacted by phone or by letter within the next 1-3 weeks.  Please call us at [phone] if you have not heard about the biopsies in 3 weeks.    SIGNATURES/CONFIDENTIALITY: You and/or your care partner have signed paperwork which will be entered into your electronic medical record.  These signatures attest to the fact that that the information above on your After Visit Summary has been reviewed and is understood.  Full responsibility of the confidentiality of this discharge information lies with you and/or your care-partner.

## 2019-01-18 NOTE — Progress Notes (Signed)
PT taken to PACU. Monitors in place. VSS. Report given to RN. 

## 2019-01-23 ENCOUNTER — Ambulatory Visit
Admission: RE | Admit: 2019-01-23 | Discharge: 2019-01-23 | Disposition: A | Discharge status: Home / Self Care | Payer: Medicare Other | Source: Ambulatory Visit | Attending: Nephrology | Admitting: Nephrology

## 2019-01-23 ENCOUNTER — Telehealth: Payer: Self-pay

## 2019-01-23 ENCOUNTER — Other Ambulatory Visit: Payer: Self-pay

## 2019-01-23 DIAGNOSIS — N183 Chronic kidney disease, stage 3 unspecified: Secondary | ICD-10-CM

## 2019-01-23 NOTE — Telephone Encounter (Signed)
  Follow up Call-  Call back number 01/18/2019 12/13/2018 11/09/2016  Post procedure Call Back phone  # [phone]  Permission to leave phone message Yes Yes Yes  Some recent data might be hidden     Patient questions:  Do you have a fever, pain , or abdominal swelling? No Pain Score  0  Have you tolerated food without any problems? Yes  Have you been able to return to your normal activities? Yes  Do you have any questions about your discharge instructions: Diet   No Medications  No Follow up visit  No Do you have questions or concerns about your Care? No  Actions: * If pain score is 4 or above: No action needed, pain <4  1. Have you developed a fever since your procedure? No  2.   Have you had an respiratory symptoms (SOB or cough) since your procedure? No  3.   Have you tested positive for COVID 19 since your procedure No  4.   Have you had any family members/close contacts diagnosed with the COVID 19 since your procedure?  No   If yes to any of these questions please route to Joylene John, RN and Alphonsa Gin, RN.

## 2019-02-06 DIAGNOSIS — I129 Hypertensive chronic kidney disease with stage 1 through stage 4 chronic kidney disease, or unspecified chronic kidney disease: Secondary | ICD-10-CM | POA: Diagnosis not present

## 2019-02-06 DIAGNOSIS — R809 Proteinuria, unspecified: Secondary | ICD-10-CM | POA: Diagnosis not present

## 2019-02-06 DIAGNOSIS — N183 Chronic kidney disease, stage 3 (moderate): Secondary | ICD-10-CM | POA: Diagnosis not present

## 2019-03-22 ENCOUNTER — Ambulatory Visit (INDEPENDENT_AMBULATORY_CARE_PROVIDER_SITE_OTHER): Payer: Medicare Other | Admitting: Neurology

## 2019-03-22 ENCOUNTER — Encounter: Payer: Self-pay | Admitting: Neurology

## 2019-03-22 ENCOUNTER — Other Ambulatory Visit: Payer: Self-pay

## 2019-03-22 VITALS — BP 119/70 | HR 109 | Temp 98.0°F | Ht 62.0 in | Wt 103.5 lb

## 2019-03-22 DIAGNOSIS — M5416 Radiculopathy, lumbar region: Secondary | ICD-10-CM | POA: Insufficient documentation

## 2019-03-22 DIAGNOSIS — M47812 Spondylosis without myelopathy or radiculopathy, cervical region: Secondary | ICD-10-CM

## 2019-03-22 DIAGNOSIS — G43009 Migraine without aura, not intractable, without status migrainosus: Secondary | ICD-10-CM

## 2019-03-22 MED ORDER — SUMATRIPTAN SUCCINATE 100 MG PO TABS: ORAL_TABLET | ORAL | 11 refills | Status: DC

## 2019-03-22 NOTE — Progress Notes (Signed)
[facility]  PATIENT: Teresa Lozano DOB: 19-May-1951  REFERRING DOCTOR OR PCP:  Webb Silversmith SOURCE: patient, notes from PCP  _________________________________   HISTORICAL  CHIEF COMPLAINT:  Chief Complaint  Patient presents with  . Follow-up    RM 13, alone. Last seen 09/19/2018. Denies any new sx since last seen. Taking imipramine '75mg'$  qhs, keppra '500mg'$  BID, imitrex/fioricet prn    HISTORY OF PRESENT ILLNESS:  Teresa Lozano is a 68 y.o. woman who has had headaches for 40 years.    Update 03/22/19: She feels her headaches are about the same and generally well controlled.  Most headaches are occipital and radiate forward.    These are occurring weekly.  She has had only severe migraine over the last year.  She continues on imipramine 75 mg nightly and Keppra 500 mg p.o. twice daily  She notes shooting pain in her fingers and toes.   The 4th finger seems the most involved.   The 4th and 5th toes are more involved than the rest of the foot.   She has pain left > right.   She does not note weakness in the hand hands or feet.  She has some neck pain and back pain.   Update 09/19/2018: She reports her headaches are doing about the same.   Most of them are occipital and shoot up an inch or so from her neck.     Sometimes she gets nausea but not vomiting.    Moving does not alter pain much.      She is on imipramine 75 mg at bedtime and tolerates that well.     Recent blood work showed mild reduced renal function with GFR in the 50's and Vit D was low.     CT has shown severe left facet changes at [facility] in 2011 and much milder facet hypertrophy to the right at [facility].   She has ACDF at [facility] (Dr. Ronnald Ramp 2011)  She had kidney stones so TPM and zonisamide should not be used    Tizanidine caused nausea.   We discussed trying not to take NSAID's due to kidneys (also had PUD in past).    Update 03/08/2018: She has headaches 4-5 days a week (16-20 days/month) lasting at least 4  hours a day.   About 1 a week turns into a migraine.   The more frequent headaches are in the occiput.    Occipital nerve blocks have sometimes helped but she almost lost consciousness with the last one and  felt sick the rest of the day.     Most HA's are tension type in the occiput.  She takes Excedrin and takes about 12/week.  If worse, she takes Fioricet (no more than 4/week).   Tylenol has not helped.    For the migraines with pounding frontal pain and nausea/photophobia/phonophobia she will take Imitrex and that usually helps.       She is on imipramine 75 mg nightly as a prophylactic agent.    She can't take Topiramate or zonisamide due to kidney stones.   Due to ulcers, she can't take NSAIDs.   Gabapentin was once tried but she does not recall if it helped any.    Update 10/06/2017: She is experiencing migraines about 2-3 days a week.    She is doing PT for her left shoulder and the therapy increases her pain.    A lot of the headaches are predominantly occipital    Many years ago, when  she had daily headaches, she got some benefit from occipital nerve blocks.   She is currently in imipramine 75 mg nightly.     Imitrex sometimes helps when a HA occurs, more for the frontal HA than for the occipital ones.   Fioricet (2 pills) helps but she is limited in quantity to prevent rebound.   Due to ulcers, she can't take NSAID's.   Due to kidney stones, she can't take Topamax or Zonegran.   She gets frequent epigastric pain still.   From 07/08/2017: She is currently having 9-12 headaches a month, tension and migraine.     Her tension type HA's are mostly in her neck.  With these headaches, pain is achy and more constant  She lays on a heating pad with benefit.     Moving increases the pain.    She takes Excedrin when a HA is bad.   Due to a severe ulcer (needed 40% stomach removed), she is advised not to take any NSAID.   She also has migraines 4-5  a month with pounding pain behind her eye, nausea,  photophobia and phonophobia.   Moving worsens the headaches.   Excedrin sometimes helps.   She has not taken a triptan in over 5 years and used to get some benefit with Imitrex.  I last saw her 5 years ago at a different practice.   After that she lost insurance and stopped her medications.   She had some benefit from a combination of a beta blocker, imipramine and Imitrex and Fioricet for breakthrough   Additionally, for the past 45 months she has had a lot more pain in the left shoulder. Pain is worse with external rotation and elevation. Pain is also worse if she lays on that side. Slightly helps. She denies any weakness in the arms or legs.  Medication history:    She had some benefit for her migraines with imipramine and Inderal.   She has not tried Topiramate.    Medical issues:   History of gastric surgery for ulcer and cervical fusion                                                                                                                     REVIEW OF SYSTEMS: Constitutional: No fevers, chills, sweats, or change in appetite Eyes: No visual changes, double vision, eye pain Ear, nose and throat: No hearing loss, ear pain, nasal congestion, sore throat Cardiovascular: No chest pain, palpitations Respiratory: No shortness of breath at rest or with exertion.   No wheezes GastrointestinaI: No nausea, vomiting, diarrhea, abdominal pain, fecal incontinence Genitourinary: No dysuria, urinary retention or frequency.  No nocturia. Musculoskeletal: No neck pain, back pain Integumentary: No rash, pruritus, skin lesions Neurological: as above Psychiatric: No depression at this time.  No anxiety Endocrine: No palpitations, diaphoresis, change in appetite, change in weigh or increased thirst Hematologic/Lymphatic: No anemia, purpura, petechiae. Allergic/Immunologic: No itchy/runny eyes, nasal congestion, recent allergic reactions, rashes  ALLERGIES: Allergies  Allergen Reactions  .  Robaxin [Methocarbamol] Nausea And Vomiting  . Lisinopril Cough    HOME MEDICATIONS:  Current Outpatient Medications:  .  amLODipine (NORVASC) 10 MG tablet, TAKE 1 TABLET BY MOUTH EVERY DAY, Disp: 90 tablet, Rfl: 1 .  atorvastatin (LIPITOR) 20 MG tablet, Take 1 tablet (20 mg total) by mouth daily., Disp: 90 tablet, Rfl: 0 .  butalbital-acetaminophen-caffeine (FIORICET, ESGIC) 50-325-40 MG tablet, 1 to 2 po prn headache.   No more than 16 pills a month, Disp: 16 tablet, Rfl: 5 .  cetirizine (ZYRTEC) 10 MG tablet, Take 10 mg by mouth daily., Disp: , Rfl:  .  imipramine (TOFRANIL) 25 MG tablet, TAKE 3 TABLETS (75 MG TOTAL) BY MOUTH AT BEDTIME., Disp: 270 tablet, Rfl: 3 .  levETIRAcetam (KEPPRA) 500 MG tablet, Take 1 tablet (500 mg total) by mouth 2 (two) times daily., Disp: 60 tablet, Rfl: 11 .  Magnesium 500 MG CAPS, Take 500 mg by mouth 2 (two) times a week. , Disp: , Rfl:  .  Multiple Vitamin (MULTI-VITAMIN DAILY PO), Take by mouth., Disp: , Rfl:  .  omeprazole (PRILOSEC) 40 MG capsule, Take 1 capsule (40 mg total) by mouth daily., Disp: 60 capsule, Rfl: 5 .  ondansetron (ZOFRAN) 4 MG tablet, Take 1 tablet (4 mg total) by mouth every 8 (eight) hours as needed., Disp: 20 tablet, Rfl: 5 .  polyethylene glycol (MIRALAX / GLYCOLAX) packet, Take 17 g by mouth daily., Disp: , Rfl:  .  SUMAtriptan (IMITREX) 100 MG tablet, TAKE 1 TAB AS NEEDED FOR MIGRAINE. MAY REPEAT IN 2 HRS IF HEADACHE PERSISTS OR RECURS. DNF 6/19, Disp: 10 tablet, Rfl: 11 .  tamsulosin (FLOMAX) 0.4 MG CAPS capsule, TAKE 1 CAPSULE (0.4 MG TOTAL) BY MOUTH DAILY AS NEEDED (KIDNEY STONE)., Disp: 15 capsule, Rfl: 1 .  VITAMIN D PO, Take 5,000 Units by mouth daily., Disp: , Rfl:   Current Facility-Administered Medications:  .  0.9 %  sodium chloride infusion, 500 mL, Intravenous, Once, Armbruster, Carlota Raspberry, MD  PAST MEDICAL HISTORY: Past Medical History:  Diagnosis Date  . Allergy   . Arthritis   . Chicken pox   . Frequent  headaches   . GERD (gastroesophageal reflux disease)   . History of kidney stones   . Hyperlipidemia   . Hypertension   . Osteoporosis     PAST SURGICAL HISTORY: Past Surgical History:  Procedure Laterality Date  . CERVICAL FUSION  2000  . COLONOSCOPY    . ESOPHAGOGASTRODUODENOSCOPY  11/2016  . ESOPHAGOGASTRODUODENOSCOPY (EGD) WITH PROPOFOL N/A 12/28/2016   Procedure: ESOPHAGOGASTRODUODENOSCOPY (EGD) WITH PROPOFOL;  Surgeon: Manus Gunning, MD;  Location: [facility];  Service: Gastroenterology;  Laterality: N/A;  . EYE SURGERY    . STOMACH SURGERY  04/2017   REMOVED ULCER  . TONSILLECTOMY    . UPPER GASTROINTESTINAL ENDOSCOPY      FAMILY HISTORY: Family History  Problem Relation Age of Onset  . Hyperlipidemia Mother   . Hypertension Mother   . Prostate cancer Father   . Hyperlipidemia Father   . Heart disease Father   . Heart disease Brother   . Hypertension Sister   . Esophageal cancer Sister   . Hypertension Maternal Grandmother   . Colon cancer Neg Hx   . Colon polyps Neg Hx   . Stomach cancer Neg Hx   . Rectal cancer Neg Hx     SOCIAL HISTORY:  Social History   Socioeconomic History  . Marital status: Widowed    Spouse name: Not  on file  . Number of children: Not on file  . Years of education: Not on file  . Highest education level: Not on file  Occupational History  . Not on file  Social Needs  . Financial resource strain: Not on file  . Food insecurity    Worry: Not on file    Inability: Not on file  . Transportation needs    Medical: Not on file    Non-medical: Not on file  Tobacco Use  . Smoking status: Never Smoker  . Smokeless tobacco: Never Used  Substance and Sexual Activity  . Alcohol use: No    Alcohol/week: 0.0 standard drinks  . Drug use: No  . Sexual activity: Never  Lifestyle  . Physical activity    Days per week: Not on file    Minutes per session: Not on file  . Stress: Not on file  Relationships  . Social  Herbalist on phone: Not on file    Gets together: Not on file    Attends religious service: Not on file    Active member of club or organization: Not on file    Attends meetings of clubs or organizations: Not on file    Relationship status: Not on file  . Intimate partner violence    Fear of current or ex partner: Not on file    Emotionally abused: Not on file    Physically abused: Not on file    Forced sexual activity: Not on file  Other Topics Concern  . Not on file  Social History Narrative  . Not on file     PHYSICAL EXAM  Vitals:   03/22/19 0938  BP: 119/70  Pulse: (!) 109  Temp: 98 F (36.7 C)  SpO2: 97%  Weight: 103 lb 8 oz (46.9 kg)  Height: '5\' 2"'$  (1.575 m)    Body mass index is 18.93 kg/m.   General: The patient is well-developed and well-nourished and in no acute distress  Musculoskeletal: She has tenderness over the upper cervical paraspinal muscles and moderate tenderness over the left > right occiput/splenius capitis/occipital nerve.  Neurologic Exam  Mental status: The patient is alert and oriented x 3 at the time of the examination. The patient has apparent normal recent and remote memory, with an apparently normal attention span and concentration ability.   Speech is normal.  Cranial nerves: Extraocular muscles are intact.  Facial strength and sensation is normal.  Trapezius strength is normal. . No obvious hearing deficits are noted.  Motor:  Muscle bulk is normal.   Tone is normal. Strength is  5 / 5 in all 4 extremities.   Sensory: Sensory testing is intact to touch  in all 4 extremities.  Coordination: She has good finger-nose-finger.  Gait and station: Station is normal.   Gait and tandem gait are normal.  Reflexes: Deep tendon reflexes are symmetric and normal bilaterally.      DIAGNOSTIC DATA (LABS, IMAGING, TESTING) - I reviewed patient records, labs, notes, testing and imaging myself where available.  Lab Results   Component Value Date   WBC 5.4 09/05/2018   HGB 12.2 09/05/2018   HCT 38.1 09/05/2018   MCV 93.3 09/05/2018   PLT 277.0 09/05/2018      Component Value Date/Time   NA 142 09/05/2018 0956   K 5.0 09/05/2018 0956   CL 108 09/05/2018 0956   CO2 29 09/05/2018 0956   GLUCOSE 89 09/05/2018 0956   BUN 20 09/05/2018  2984   CREATININE 1.08 09/05/2018 0956   CALCIUM 9.0 09/05/2018 0956   PROT 5.8 (L) 09/05/2018 0956   ALBUMIN 3.6 09/05/2018 0956   AST 19 09/05/2018 0956   ALT 16 09/05/2018 0956   ALKPHOS 103 09/05/2018 0956   BILITOT 0.2 09/05/2018 0956   GFRNONAA 49 (L) 12/17/2009 0855   GFRAA (L) 12/17/2009 0855    59        The eGFR has been calculated using the MDRD equation. This calculation has not been validated in all clinical situations. eGFR's persistently <60 mL/min signify possible Chronic Kidney Disease.   Lab Results  Component Value Date   CHOL 187 09/05/2018   HDL 51.30 09/05/2018   LDLCALC 120 (H) 09/05/2018   TRIG 79.0 09/05/2018   CHOLHDL 4 09/05/2018      ASSESSMENT AND PLAN  No diagnosis found.   1.   Continue imipramine 75 mg nightly and Keppra 500 mg bid for HA.  . 2.    Renew Imitrex prn migraine.   3.    NCV/EMG of the hands and 1 leg due to the numbness to determine if there is a mononeuropathy or radiculopathy. 4.    RTC 6 months   Kaimen Peine A. Felecia Shelling, MD, [facility] 01/19/855, 94:37 AM Certified in Neurology, Clinical Neurophysiology, Sleep Medicine, Pain Medicine and Neuroimaging  [facility] [address] [place], [place] [address] [phone]

## 2019-04-01 ENCOUNTER — Emergency Department (HOSPITAL_COMMUNITY)
Admission: EM | Admit: 2019-04-01 | Discharge: 2019-04-01 | Disposition: A | Discharge status: Home / Self Care | Payer: Medicare Other | Attending: Emergency Medicine | Admitting: Emergency Medicine

## 2019-04-01 ENCOUNTER — Encounter (HOSPITAL_COMMUNITY): Payer: Self-pay | Admitting: Emergency Medicine

## 2019-04-01 ENCOUNTER — Emergency Department (HOSPITAL_COMMUNITY): Payer: Medicare Other

## 2019-04-01 ENCOUNTER — Other Ambulatory Visit: Payer: Self-pay

## 2019-04-01 DIAGNOSIS — R109 Unspecified abdominal pain: Secondary | ICD-10-CM | POA: Diagnosis present

## 2019-04-01 DIAGNOSIS — I10 Essential (primary) hypertension: Secondary | ICD-10-CM | POA: Diagnosis not present

## 2019-04-01 DIAGNOSIS — N200 Calculus of kidney: Secondary | ICD-10-CM | POA: Diagnosis not present

## 2019-04-01 DIAGNOSIS — Z903 Acquired absence of stomach [part of]: Secondary | ICD-10-CM | POA: Diagnosis not present

## 2019-04-01 DIAGNOSIS — Z79899 Other long term (current) drug therapy: Secondary | ICD-10-CM | POA: Diagnosis not present

## 2019-04-01 DIAGNOSIS — N2889 Other specified disorders of kidney and ureter: Secondary | ICD-10-CM | POA: Diagnosis not present

## 2019-04-01 LAB — URINALYSIS, ROUTINE W REFLEX MICROSCOPIC
Bacteria, UA: NONE SEEN
Bilirubin Urine: NEGATIVE
Glucose, UA: NEGATIVE mg/dL
Ketones, ur: NEGATIVE mg/dL
Leukocytes,Ua: NEGATIVE
Nitrite: NEGATIVE
Protein, ur: NEGATIVE mg/dL
RBC / HPF: >50 RBC/hpf — ABNORMAL HIGH (ref 0–5)
Specific Gravity, Urine: 1.018 (ref 1.005–1.030)
pH: 6 (ref 5.0–8.0)

## 2019-04-01 NOTE — Discharge Instructions (Signed)
You have a 4 mm kidney stone on your left side that is passing down your ureter.  A stone this size will very likely pass on its own, but it can take several weeks. Drink lots of fluid at home and take tylenol as needed for pain.  Please read over the attached instructions.

## 2019-04-01 NOTE — ED Provider Notes (Signed)
[facility] DEPT Provider Note   CSN: [id number] Arrival date & time: 04/01/19  1916     History   Chief Complaint Chief Complaint  Patient presents with  . Flank Pain    HPI Teresa Lozano is a 68 y.o. female the past medical history of kidney stones presented emergency department with left flank pain.  She reports that she has been having intermittent episodes of left lower back pain on and off for the past several days.  Yesterday and today became more severe.  It wraps around to the front side radiates towards her midline.  It tends to come and go.  Currently her pain is subsided somewhat although it is still there.  She says it feels her prior kidney stones.  Her last kidney stone was 10 to 15 years ago.  She does report a history of of needing urological intervention for large kidney stones.  She denies any dysuria, fevers, chills.  She reports nausea today but no vomiting.  She denies any diarrhea or bloody bowel movements.  She denies any vaginal discharge or bleeding.   HPI  Past Medical History:  Diagnosis Date  . Allergy   . Arthritis   . Chicken pox   . Frequent headaches   . GERD (gastroesophageal reflux disease)   . History of kidney stones   . Hyperlipidemia   . Hypertension   . Osteoporosis     Patient Active Problem List   Diagnosis Date Noted  . Lumbar radiculopathy 03/22/2019  . Cervical spondyloarthritis 09/19/2018  . Cervical facet joint syndrome 09/19/2018  . Flank pain 04/10/2018  . Common migraine without intractability 07/08/2017  . Chronic prepyloric ulcer with gastric outlet obstruction s/p distal gastrectomy 05/04/2017 05/04/2017  . History of Billroth II gastrojejunostomy reconstruction 05/04/2017 05/04/2017  . History of truncal vagotomy for chronic ulcer disease 05/04/2017 05/04/2017  . Age-related osteoporosis without current pathological fracture 04/26/2017  . Arthritis 12/06/2016  . HLD (hyperlipidemia)  12/01/2015  . Essential hypertension 11/10/2015  . Frequent headaches 11/10/2015  . GERD (gastroesophageal reflux disease) 11/10/2015  . Seasonal allergies 11/10/2015    Past Surgical History:  Procedure Laterality Date  . CERVICAL FUSION  2000  . COLONOSCOPY    . ESOPHAGOGASTRODUODENOSCOPY  11/2016  . ESOPHAGOGASTRODUODENOSCOPY (EGD) WITH PROPOFOL N/A 12/28/2016   Procedure: ESOPHAGOGASTRODUODENOSCOPY (EGD) WITH PROPOFOL;  Surgeon: Manus Gunning, MD;  Location: [facility];  Service: Gastroenterology;  Laterality: N/A;  . EYE SURGERY    . STOMACH SURGERY  04/2017   REMOVED ULCER  . TONSILLECTOMY    . UPPER GASTROINTESTINAL ENDOSCOPY       OB History   No obstetric history on file.      Home Medications    Prior to Admission medications   Medication Sig Start Date End Date Taking? Authorizing Provider  amLODipine (NORVASC) 10 MG tablet TAKE 1 TABLET BY MOUTH EVERY DAY 10/31/18   [name], NP  atorvastatin (LIPITOR) 20 MG tablet Take 1 tablet (20 mg total) by mouth daily. 08/10/18   [name], NP  butalbital-acetaminophen-caffeine (FIORICET, ESGIC) 50-325-40 MG tablet 1 to 2 po prn headache.   No more than 16 pills a month 09/19/18   Sater, Nanine Means, MD  cetirizine (ZYRTEC) 10 MG tablet Take 10 mg by mouth daily.    [provider]  imipramine (TOFRANIL) 25 MG tablet TAKE 3 TABLETS (75 MG TOTAL) BY MOUTH AT BEDTIME. 11/09/18   Sater, Nanine Means, MD  levETIRAcetam (  KEPPRA) 500 MG tablet Take 1 tablet (500 mg total) by mouth 2 (two) times daily. 09/19/18   Sater, Nanine Means, MD  Magnesium 500 MG CAPS Take 500 mg by mouth 2 (two) times a week.     [provider]  Multiple Vitamin (MULTI-VITAMIN DAILY PO) Take by mouth.    [provider]  omeprazole (PRILOSEC) 40 MG capsule Take 1 capsule (40 mg total) by mouth daily. 05/08/17   [name], MD  ondansetron (ZOFRAN) 4 MG tablet Take 1 tablet (4 mg total) by mouth every 8 (eight) hours  as needed. 09/19/18   Sater, Nanine Means, MD  polyethylene glycol (MIRALAX / GLYCOLAX) packet Take 17 g by mouth daily.    [provider]  SUMAtriptan (IMITREX) 100 MG tablet TAKE 1 TAB AS NEEDED FOR MIGRAINE. MAY REPEAT IN 2 HRS IF HEADACHE PERSISTS OR RECURS. DNF 6/19 03/22/19   Sater, Nanine Means, MD  tamsulosin (FLOMAX) 0.4 MG CAPS capsule TAKE 1 CAPSULE (0.4 MG TOTAL) BY MOUTH DAILY AS NEEDED (KIDNEY STONE). 11/25/18   [name], NP  VITAMIN D PO Take 5,000 Units by mouth daily.    [provider]    Family History Family History  Problem Relation Age of Onset  . Hyperlipidemia Mother   . Hypertension Mother   . Prostate cancer Father   . Hyperlipidemia Father   . Heart disease Father   . Heart disease Brother   . Hypertension Sister   . Esophageal cancer Sister   . Hypertension Maternal Grandmother   . Colon cancer Neg Hx   . Colon polyps Neg Hx   . Stomach cancer Neg Hx   . Rectal cancer Neg Hx     Social History Social History   Tobacco Use  . Smoking status: Never Smoker  . Smokeless tobacco: Never Used  Substance Use Topics  . Alcohol use: No    Alcohol/week: 0.0 standard drinks  . Drug use: No     Allergies   Robaxin [methocarbamol] and Lisinopril   Review of Systems Review of Systems  Constitutional: Negative for chills and fever.  Respiratory: Negative for cough and shortness of breath.   Cardiovascular: Negative for chest pain and palpitations.  Gastrointestinal: Positive for abdominal pain and nausea. Negative for constipation, diarrhea and vomiting.  Genitourinary: Positive for flank pain. Negative for dysuria, hematuria, vaginal bleeding, vaginal discharge and vaginal pain.  Skin: Negative for pallor and rash.  Psychiatric/Behavioral: Negative for agitation and confusion.  All other systems reviewed and are negative.    Physical Exam Updated Vital Signs BP 125/76 (BP Location: Right Arm)   Pulse 87   Temp 98.4 F (36.9 C)  (Oral)   Resp 18   Ht 5\' 2"  (1.575 m)   Wt 46.7 kg   SpO2 97%   BMI 18.84 kg/m   Physical Exam Vitals signs and nursing note reviewed.  Constitutional:      General: She is not in acute distress.    Appearance: She is well-developed.  HENT:     Head: Normocephalic and atraumatic.  Eyes:     Conjunctiva/sclera: Conjunctivae normal.  Neck:     Musculoskeletal: Neck supple.  Cardiovascular:     Rate and Rhythm: Normal rate and regular rhythm.     Heart sounds: No murmur.  Pulmonary:     Effort: Pulmonary effort is normal. No respiratory distress.     Breath sounds: Normal breath sounds.  Abdominal:     General: Abdomen is  flat. There is no distension.     Palpations: Abdomen is soft.     Tenderness: There is no abdominal tenderness. There is no right CVA tenderness, left CVA tenderness or guarding.  Skin:    General: Skin is warm and dry.  Neurological:     Mental Status: She is alert.  Psychiatric:        Mood and Affect: Mood normal.        Behavior: Behavior normal.      ED Treatments / Results  Labs (all labs ordered are listed, but only abnormal results are displayed) Labs Reviewed  URINALYSIS, ROUTINE W REFLEX MICROSCOPIC - Abnormal; Notable for the following components:      Result Value   Hgb urine dipstick LARGE (*)    RBC / HPF >50 (*)    All other components within normal limits    EKG None  Radiology Ct Renal Stone Study  Result Date: 04/01/2019 CLINICAL DATA:  68 year old female with left flank pain since 1300 hours today. Vomiting. EXAM: CT ABDOMEN AND PELVIS WITHOUT CONTRAST TECHNIQUE: Multidetector CT imaging of the abdomen and pelvis was performed following the standard protocol without IV contrast. COMPARISON:  CT Abdomen 11/16/2016. FINDINGS: Lower chest: Larger lung volumes, negative. No pericardial or pleural effusion. Hepatobiliary: The gallbladder is mildly distended but without pericholecystic inflammation. Negative noncontrast liver. No  dilated bile ducts. Pancreas: Negative noncontrast pancreas. Spleen: Negative. Adrenals/Urinary Tract: Normal adrenal glands. Chronic exophytic left renal cyst at the midpole with simple fluid density is stable. Smaller chronic right renal midpole cyst is stable. No right nephrolithiasis or hydronephrosis. Several small left renal lower pole calculi, up to the 3 millimeter. Asymmetric enlargement of the left renal collecting system and ureter. The left ureter remains dilated to the ureterovesical junction where a 3-4 millimeter calculus is suspected on series 2, image 72. There are multiple superimposed phleboliths in the region. Diminutive and unremarkable urinary bladder. Stomach/Bowel: Highly redundant sigmoid colon. Intermittent retained stool. Highly redundant transverse colon with retained stool mixed with some dense enteric material. Similar right colon retained stool. Negative terminal ileum. Diminutive or absent appendix. No large bowel inflammation. No dilated small bowel. New distal gastrectomy changes since 2018. There is a left upper quadrant gastrojejunostomy. 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 No free air. No free fluid. Vascular/Lymphatic: Calcified aortic atherosclerosis. Vascular patency is not evaluated in the absence of IV contrast. No lymphadenopathy. Reproductive: Diminutive or absent uterus and ovaries. Other: No pelvic free fluid. Musculoskeletal: No acute osseous abnormality identified. IMPRESSION: 1. Acute obstructive uropathy on the left with a 3-4 mm calculus suspected at the left UVJ. Superimposed left nephrolithiasis. 2. Distal gastrectomy and gastrojejunostomy since the 2018 CT. Retained stool in redundant large bowel. Aortic Atherosclerosis (ICD10-I70.0). Electronically Signed   By: Genevie Ann M.D.   On: 04/01/2019 21:47    Procedures Procedures (including critical care time)  Medications Ordered in ED Medications - No data to display   Initial Impression / Assessment  and Plan / ED Course  I have reviewed the triage vital signs and the nursing notes.  Pertinent labs & imaging results that were available during my care of the patient were reviewed by me and considered in my medical decision making (see chart for details).  68 year old well-appearing female presenting with left-sided flank pain that radiates to the groin.  Her UA on arrival shows large blood.  There is no signs of infection or urine.  Her history is consistent for kidney stone.  She  shows no systemic signs of infection or sepsis.  Clinically I do not suspect pyelonephritis.  She otherwise has a benign abdominal exam and I have a low suspicion for acute infectious or surgical intra-abdominal process at this time.  We will obtain a CT renal stone study.  Clinical Course as of Mar 31 2233  Nancy Fetter Apr 01, 2019  2155 IMPRESSION: 1. Acute obstructive uropathy on the left with a 3-4 mm calculus suspected at the left UVJ. Superimposed left nephrolithiasis. 2. Distal gastrectomy and gastrojejunostomy since the 2018 CT. Retained stool in redundant large bowel. Aortic Atherosclerosis (ICD10-I70.0).   [MT]    Clinical Course User Index [MT] Langston Masker Carola Rhine, MD       Final Clinical Impressions(s) / ED Diagnoses   Final diagnoses:  Kidney stone    ED Discharge Orders    None       Wyvonnia Dusky, MD 04/01/19 2234

## 2019-04-01 NOTE — ED Notes (Signed)
Patient transported to CT 

## 2019-04-01 NOTE — ED Triage Notes (Signed)
Pt reports having left flank pain that started at 1pm today. Pt also reported episodes of vomiting. Prior hx of kidney stones bilaterally.

## 2019-04-05 ENCOUNTER — Encounter: Payer: Self-pay | Admitting: Internal Medicine

## 2019-04-05 ENCOUNTER — Other Ambulatory Visit: Payer: Self-pay

## 2019-04-05 ENCOUNTER — Ambulatory Visit (INDEPENDENT_AMBULATORY_CARE_PROVIDER_SITE_OTHER): Payer: Medicare Other | Admitting: Internal Medicine

## 2019-04-05 VITALS — BP 124/78 | HR 120 | Temp 97.9°F | Wt 100.0 lb

## 2019-04-05 DIAGNOSIS — K219 Gastro-esophageal reflux disease without esophagitis: Secondary | ICD-10-CM

## 2019-04-05 DIAGNOSIS — I10 Essential (primary) hypertension: Secondary | ICD-10-CM | POA: Diagnosis not present

## 2019-04-05 DIAGNOSIS — R51 Headache: Secondary | ICD-10-CM | POA: Diagnosis not present

## 2019-04-05 DIAGNOSIS — Z23 Encounter for immunization: Secondary | ICD-10-CM

## 2019-04-05 DIAGNOSIS — Z87442 Personal history of urinary calculi: Secondary | ICD-10-CM

## 2019-04-05 DIAGNOSIS — E7800 Pure hypercholesterolemia, unspecified: Secondary | ICD-10-CM

## 2019-04-05 DIAGNOSIS — R519 Headache, unspecified: Secondary | ICD-10-CM

## 2019-04-05 DIAGNOSIS — M1990 Unspecified osteoarthritis, unspecified site: Secondary | ICD-10-CM | POA: Diagnosis not present

## 2019-04-05 LAB — COMPREHENSIVE METABOLIC PANEL
ALT: 18 U/L (ref 0–35)
AST: 20 U/L (ref 0–37)
Albumin: 3.9 g/dL (ref 3.5–5.2)
Alkaline Phosphatase: 96 U/L (ref 39–117)
BUN: 16 mg/dL (ref 6–23)
CO2: 34 mEq/L — ABNORMAL HIGH (ref 19–32)
Calcium: 9.7 mg/dL (ref 8.4–10.5)
Chloride: 102 mEq/L (ref 96–112)
Creatinine, Ser: 1.29 mg/dL — ABNORMAL HIGH (ref 0.40–1.20)
GFR: 41.22 mL/min — ABNORMAL LOW (ref >60.00)
Glucose, Bld: 78 mg/dL (ref 70–99)
Potassium: 4.7 mEq/L (ref 3.5–5.1)
Sodium: 140 mEq/L (ref 135–145)
Total Bilirubin: 0.3 mg/dL (ref 0.2–1.2)
Total Protein: 6.2 g/dL (ref 6.0–8.3)

## 2019-04-05 LAB — CBC
HCT: 44.6 % (ref 36.0–46.0)
Hemoglobin: 14.6 g/dL (ref 12.0–15.0)
MCHC: 32.8 g/dL (ref 30.0–36.0)
MCV: 99.5 fl (ref 78.0–100.0)
Platelets: 274 10*3/uL (ref 150.0–400.0)
RBC: 4.48 Mil/uL (ref 3.87–5.11)
RDW: 12.9 % (ref 11.5–15.5)
WBC: 6.6 10*3/uL (ref 4.0–10.5)

## 2019-04-05 LAB — LIPID PANEL
Cholesterol: 168 mg/dL (ref 0–200)
HDL: 55.1 mg/dL (ref >39.00)
LDL Cholesterol: 85 mg/dL (ref 0–99)
NonHDL: 113.16
Total CHOL/HDL Ratio: 3
Triglycerides: 140 mg/dL (ref 0.0–149.0)
VLDL: 28 mg/dL (ref 0.0–40.0)

## 2019-04-05 MED ORDER — AMLODIPINE BESYLATE 10 MG PO TABS: 10.0000 mg | ORAL_TABLET | Freq: Every day | ORAL | 3 refills | Status: DC

## 2019-04-05 MED ORDER — TRAMADOL HCL 50 MG PO TABS: 50.0000 mg | ORAL_TABLET | Freq: Three times a day (TID) | ORAL | 0 refills | Status: AC | PRN

## 2019-04-05 NOTE — Assessment & Plan Note (Signed)
Continue Tylenol Arthritis °

## 2019-04-05 NOTE — Assessment & Plan Note (Signed)
Continue Keppra, Imipramine, Fioricet and Imitrex She will continue to follow with neurology

## 2019-04-05 NOTE — Patient Instructions (Signed)
Fat and Cholesterol Restricted Eating Plan Getting too much fat and cholesterol in your diet may cause health problems. Choosing the right foods helps keep your fat and cholesterol at normal levels. This can keep you from getting certain diseases. Your doctor may recommend an eating plan that includes:  Total fat: ______% or less of total calories a day.  Saturated fat: ______% or less of total calories a day.  Cholesterol: less than _________mg a day.  Fiber: ______g a day. What are tips for following this plan? Meal planning  At meals, divide your plate into four equal parts: ? Fill one-half of your plate with vegetables and green salads. ? Fill one-fourth of your plate with whole grains. ? Fill one-fourth of your plate with low-fat (lean) protein foods.  Eat fish that is high in omega-3 fats at least two times a week. This includes mackerel, tuna, sardines, and salmon.  Eat foods that are high in fiber, such as whole grains, beans, apples, broccoli, carrots, peas, and barley. General tips   Work with your doctor to lose weight if you need to.  Avoid: ? Foods with added sugar. ? Fried foods. ? Foods with partially hydrogenated oils.  Limit alcohol intake to no more than 1 drink a day for nonpregnant women and 2 drinks a day for men. One drink equals 12 oz of beer, 5 oz of wine, or 1 oz of hard liquor. Reading food labels  Check food labels for: ? Trans fats. ? Partially hydrogenated oils. ? Saturated fat (g) in each serving. ? Cholesterol (mg) in each serving. ? Fiber (g) in each serving.  Choose foods with healthy fats, such as: ? Monounsaturated fats. ? Polyunsaturated fats. ? Omega-3 fats.  Choose grain products that have whole grains. Look for the word "whole" as the first word in the ingredient list. Cooking  Cook foods using low-fat methods. These include baking, boiling, grilling, and broiling.  Eat more home-cooked foods. Eat at restaurants and buffets  less often.  Avoid cooking using saturated fats, such as butter, cream, palm oil, palm kernel oil, and coconut oil. Recommended foods  Fruits  All fresh, canned (in natural juice), or frozen fruits. Vegetables  Fresh or frozen vegetables (raw, steamed, roasted, or grilled). Green salads. Grains  Whole grains, such as whole wheat or whole grain breads, crackers, cereals, and pasta. Unsweetened oatmeal, bulgur, barley, quinoa, or brown rice. Corn or whole wheat flour tortillas. Meats and other protein foods  Ground beef (85% or leaner), grass-fed beef, or beef trimmed of fat. Skinless chicken or turkey. Ground chicken or turkey. Pork trimmed of fat. All fish and seafood. Egg whites. Dried beans, peas, or lentils. Unsalted nuts or seeds. Unsalted canned beans. Nut butters without added sugar or oil. Dairy  Low-fat or nonfat dairy products, such as skim or 1% milk, 2% or reduced-fat cheeses, low-fat and fat-free ricotta or cottage cheese, or plain low-fat and nonfat yogurt. Fats and oils  Tub margarine without trans fats. Light or reduced-fat mayonnaise and salad dressings. Avocado. Olive, canola, sesame, or safflower oils. The items listed above may not be a complete list of foods and beverages you can eat. Contact a dietitian for more information. Foods to avoid Fruits  Canned fruit in heavy syrup. Fruit in cream or butter sauce. Fried fruit. Vegetables  Vegetables cooked in cheese, cream, or butter sauce. Fried vegetables. Grains  White bread. White pasta. White rice. Cornbread. Bagels, pastries, and croissants. Crackers and snack foods that contain trans fat   and hydrogenated oils. Meats and other protein foods  Fatty cuts of meat. Ribs, chicken wings, bacon, sausage, bologna, salami, chitterlings, fatback, hot dogs, bratwurst, and packaged lunch meats. Liver and organ meats. Whole eggs and egg yolks. Chicken and turkey with skin. Fried meat. Dairy  Whole or 2% milk, cream,  half-and-half, and cream cheese. Whole milk cheeses. Whole-fat or sweetened yogurt. Full-fat cheeses. Nondairy creamers and whipped toppings. Processed cheese, cheese spreads, and cheese curds. Beverages  Alcohol. Sugar-sweetened drinks such as sodas, lemonade, and fruit drinks. Fats and oils  Butter, stick margarine, lard, shortening, ghee, or bacon fat. Coconut, palm kernel, and palm oils. Sweets and desserts  Corn syrup, sugars, honey, and molasses. Candy. Jam and jelly. Syrup. Sweetened cereals. Cookies, pies, cakes, donuts, muffins, and ice cream. The items listed above may not be a complete list of foods and beverages you should avoid. Contact a dietitian for more information. Summary  Choosing the right foods helps keep your fat and cholesterol at normal levels. This can keep you from getting certain diseases.  At meals, fill one-half of your plate with vegetables and green salads.  Eat high-fiber foods, like whole grains, beans, apples, carrots, peas, and barley.  Limit added sugar, saturated fats, alcohol, and fried foods. This information is not intended to replace advice given to you by your health care provider. Make sure you discuss any questions you have with your health care provider. Document Released: 01/04/2012 Document Revised: 03/08/2018 Document Reviewed: 03/22/2017 Elsevier Patient Education  2020 [employer]  

## 2019-04-05 NOTE — Assessment & Plan Note (Signed)
CMET and Lipid profile Continue Atorvastatin for now Encouraged her to consume a low fat diet

## 2019-04-05 NOTE — Assessment & Plan Note (Signed)
Continue Amlodipine CMET today Reinforced DASH diet

## 2019-04-05 NOTE — Assessment & Plan Note (Signed)
Lifelong PPI therapy secondary to chronic ulcer CBC and CMET today Continue Omeprazole.

## 2019-04-05 NOTE — Assessment & Plan Note (Signed)
Continue Flomax RX for Tramadol for pain (no history of seizures, on Keppra for migraines) She will follow up with urology

## 2019-04-05 NOTE — Progress Notes (Signed)
Subjective:    Patient ID: Teresa Lozano, female    DOB: January 31, 1951, 68 y.o.   MRN: [id number]  HPI  Pt presents to the clinic today for 6 month follow up of chronic conditions.  Arthritis: Mainly in her hands. She takes Tylenol Arthritis as needed with good relief of symptoms.  Frequent Headaches: These occur a few times per week. She takes Imipramine, Keppra as scheduled and Fioricet, Imitrex as needed with good relief of symptoms. She does follow with neurology.  GERD: She denies breakthrough on Omeprazole. Upper GI from 12/2016 reviewed.  History of Kidney Stones: Recent ER visit from 9/13 reviewed. She takes Flomax as needed. She has seen a urologist in the past, but not currently.  HLD: Her last LDL was 120, 08/2018. She denies myalgias on Atorvastatin. She tries to consume a low fat diet.   HTN: Her BP today is 124/78. She is taking Amlodipine as prescribed. ECG from 04/2017 reviewed.  Review of Systems      Past Medical History:  Diagnosis Date  . Allergy   . Arthritis   . Chicken pox   . Frequent headaches   . GERD (gastroesophageal reflux disease)   . History of kidney stones   . Hyperlipidemia   . Hypertension   . Osteoporosis     Current Outpatient Medications  Medication Sig Dispense Refill  . amLODipine (NORVASC) 10 MG tablet TAKE 1 TABLET BY MOUTH EVERY DAY 90 tablet 1  . atorvastatin (LIPITOR) 20 MG tablet Take 1 tablet (20 mg total) by mouth daily. 90 tablet 0  . butalbital-acetaminophen-caffeine (FIORICET, ESGIC) 50-325-40 MG tablet 1 to 2 po prn headache.   No more than 16 pills a month 16 tablet 5  . cetirizine (ZYRTEC) 10 MG tablet Take 10 mg by mouth daily.    Marland Kitchen imipramine (TOFRANIL) 25 MG tablet TAKE 3 TABLETS (75 MG TOTAL) BY MOUTH AT BEDTIME. 270 tablet 3  . levETIRAcetam (KEPPRA) 500 MG tablet Take 1 tablet (500 mg total) by mouth 2 (two) times daily. 60 tablet 11  . Magnesium 500 MG CAPS Take 500 mg by mouth 2 (two) times a week.     .  Multiple Vitamin (MULTI-VITAMIN DAILY PO) Take by mouth.    Marland Kitchen omeprazole (PRILOSEC) 40 MG capsule Take 1 capsule (40 mg total) by mouth daily. 60 capsule 5  . ondansetron (ZOFRAN) 4 MG tablet Take 1 tablet (4 mg total) by mouth every 8 (eight) hours as needed. 20 tablet 5  . polyethylene glycol (MIRALAX / GLYCOLAX) packet Take 17 g by mouth daily.    . SUMAtriptan (IMITREX) 100 MG tablet TAKE 1 TAB AS NEEDED FOR MIGRAINE. MAY REPEAT IN 2 HRS IF HEADACHE PERSISTS OR RECURS. DNF 6/19 10 tablet 11  . tamsulosin (FLOMAX) 0.4 MG CAPS capsule TAKE 1 CAPSULE (0.4 MG TOTAL) BY MOUTH DAILY AS NEEDED (KIDNEY STONE). 15 capsule 1  . VITAMIN D PO Take 5,000 Units by mouth daily.     Current Facility-Administered Medications  Medication Dose Route Frequency Provider Last Rate Last Dose  . 0.9 %  sodium chloride infusion  500 mL Intravenous Once Armbruster, Carlota Raspberry, MD        Allergies  Allergen Reactions  . Robaxin [Methocarbamol] Nausea And Vomiting  . Lisinopril Cough    Family History  Problem Relation Age of Onset  . Hyperlipidemia Mother   . Hypertension Mother   . Prostate cancer Father   . Hyperlipidemia Father   . Heart  disease Father   . Heart disease Brother   . Hypertension Sister   . Esophageal cancer Sister   . Hypertension Maternal Grandmother   . Colon cancer Neg Hx   . Colon polyps Neg Hx   . Stomach cancer Neg Hx   . Rectal cancer Neg Hx     Social History   Socioeconomic History  . Marital status: Widowed    Spouse name: Not on file  . Number of children: Not on file  . Years of education: Not on file  . Highest education level: Not on file  Occupational History  . Not on file  Social Needs  . Financial resource strain: Not on file  . Food insecurity    Worry: Not on file    Inability: Not on file  . Transportation needs    Medical: Not on file    Non-medical: Not on file  Tobacco Use  . Smoking status: Never Smoker  . Smokeless tobacco: Never Used   Substance and Sexual Activity  . Alcohol use: No    Alcohol/week: 0.0 standard drinks  . Drug use: No  . Sexual activity: Never  Lifestyle  . Physical activity    Days per week: Not on file    Minutes per session: Not on file  . Stress: Not on file  Relationships  . Social Herbalist on phone: Not on file    Gets together: Not on file    Attends religious service: Not on file    Active member of club or organization: Not on file    Attends meetings of clubs or organizations: Not on file    Relationship status: Not on file  . Intimate partner violence    Fear of current or ex partner: Not on file    Emotionally abused: Not on file    Physically abused: Not on file    Forced sexual activity: Not on file  Other Topics Concern  . Not on file  Social History Narrative  . Not on file     Constitutional: Pt reports frequent headaches. Denies fever, malaise, fatigue, or abrupt weight changes.  HEENT: Denies eye pain, eye redness, ear pain, ringing in the ears, wax buildup, runny nose, nasal congestion, bloody nose, or sore throat. Respiratory: Denies difficulty breathing, shortness of breath, cough or sputum production.   Cardiovascular: Denies chest pain, chest tightness, palpitations or swelling in the hands or feet.  Gastrointestinal: Denies abdominal pain, bloating, constipation, diarrhea or blood in the stool.  GU: Pt reports left flank pain. Denies urgency, frequency, pain with urination, burning sensation, blood in urine, odor or discharge. Musculoskeletal: Pt reports joint pain in hands. Denies decrease in range of motion, difficulty with gait, muscle pain or joint  swelling.  Skin: Denies redness, rashes, lesions or ulcercations.  Neurological: Denies dizziness, difficulty with memory, difficulty with speech or problems with balance and coordination.  Psych: Denies anxiety, depression, SI/HI.  No other specific complaints in a complete review of systems (except as  listed in HPI above).  Objective:   Physical Exam    BP 124/78   Pulse (!) 120   Temp 97.9 F (36.6 C) (Temporal)   Wt 100 lb (45.4 kg)   SpO2 98%   BMI 18.29 kg/m  Wt Readings from Last 3 Encounters:  04/05/19 100 lb (45.4 kg)  04/01/19 103 lb (46.7 kg)  03/22/19 103 lb 8 oz (46.9 kg)    General: Appears her stated age, well  developed, well nourished in NAD. Skin: Warm, dry and intact. No rashesnoted. Cardiovascular: Normal rate and rhythm. S1,S2 noted.  No murmur, rubs or gallops noted. No JVD or BLE edema. No carotid bruits noted. Pulmonary/Chest: Normal effort and positive vesicular breath sounds. No respiratory distress. No wheezes, rales or ronchi noted.  Abdomen: Soft and nontender. Normal bowel sounds. No distention or masses noted. CVA tenderness noted on the left. Musculoskeletal: Normal range of motion in hands. No signs of joint swelling. Hand grips equal. No difficulty with gait.  Neurological: Alert and oriented.   BMET    Component Value Date/Time   NA 142 09/05/2018 0956   K 5.0 09/05/2018 0956   CL 108 09/05/2018 0956   CO2 29 09/05/2018 0956   GLUCOSE 89 09/05/2018 0956   BUN 20 09/05/2018 0956   CREATININE 1.08 09/05/2018 0956   CALCIUM 9.0 09/05/2018 0956   GFRNONAA 49 (L) 12/17/2009 0855   GFRAA (L) 12/17/2009 0855    59        The eGFR has been calculated using the MDRD equation. This calculation has not been validated in all clinical situations. eGFR's persistently <60 mL/min signify possible Chronic Kidney Disease.    Lipid Panel     Component Value Date/Time   CHOL 187 09/05/2018 0956   TRIG 79.0 09/05/2018 0956   HDL 51.30 09/05/2018 0956   CHOLHDL 4 09/05/2018 0956   VLDL 15.8 09/05/2018 0956   LDLCALC 120 (H) 09/05/2018 0956    CBC    Component Value Date/Time   WBC 5.4 09/05/2018 0956   RBC 4.09 09/05/2018 0956   HGB 12.2 09/05/2018 0956   HCT 38.1 09/05/2018 0956   PLT 277.0 09/05/2018 0956   MCV 93.3 09/05/2018  0956   MCHC 32.0 09/05/2018 0956   RDW 15.4 09/05/2018 0956   LYMPHSABS 2.3 12/17/2009 0855   MONOABS 0.7 12/17/2009 0855   EOSABS 0.2 12/17/2009 0855   BASOSABS 0.0 12/17/2009 0855    Hgb A1C No results found for: HGBA1C        Assessment & Plan:

## 2019-04-06 ENCOUNTER — Telehealth: Payer: Self-pay

## 2019-04-06 MED ORDER — HYDROCODONE-ACETAMINOPHEN 5-325 MG PO TABS: 1.0000 | ORAL_TABLET | Freq: Four times a day (QID) | ORAL | 0 refills | Status: DC | PRN

## 2019-04-06 NOTE — Telephone Encounter (Signed)
Norco sent to pharmacy

## 2019-04-06 NOTE — Telephone Encounter (Signed)
Pt is aware as instructed 

## 2019-04-06 NOTE — Telephone Encounter (Signed)
Pt saw R Baity NP on 04/05/19 and the tramadol is not helping pain in lower lt back; pain level now is 5. Pt taking tramadol 50 mg q8h without relief of pain. Pt request stronger [facility] med [facility]. Pt is to call on 04/09/19 for appt with urologist.

## 2019-04-06 NOTE — Addendum Note (Signed)
Addended by: [name] on: 04/06/2019 12:08 PM   Modules accepted: Orders

## 2019-04-19 DIAGNOSIS — H524 Presbyopia: Secondary | ICD-10-CM | POA: Diagnosis not present

## 2019-04-19 DIAGNOSIS — I10 Essential (primary) hypertension: Secondary | ICD-10-CM | POA: Diagnosis not present

## 2019-04-19 DIAGNOSIS — H5213 Myopia, bilateral: Secondary | ICD-10-CM | POA: Diagnosis not present

## 2019-04-19 DIAGNOSIS — H52223 Regular astigmatism, bilateral: Secondary | ICD-10-CM | POA: Diagnosis not present

## 2019-04-19 DIAGNOSIS — H2513 Age-related nuclear cataract, bilateral: Secondary | ICD-10-CM | POA: Diagnosis not present

## 2019-04-19 DIAGNOSIS — H35033 Hypertensive retinopathy, bilateral: Secondary | ICD-10-CM | POA: Diagnosis not present

## 2019-05-08 ENCOUNTER — Encounter (INDEPENDENT_AMBULATORY_CARE_PROVIDER_SITE_OTHER): Payer: Medicare Other | Admitting: Neurology

## 2019-05-08 ENCOUNTER — Other Ambulatory Visit: Payer: Self-pay

## 2019-05-08 ENCOUNTER — Ambulatory Visit (INDEPENDENT_AMBULATORY_CARE_PROVIDER_SITE_OTHER): Payer: Medicare Other | Admitting: Neurology

## 2019-05-08 DIAGNOSIS — M5416 Radiculopathy, lumbar region: Secondary | ICD-10-CM

## 2019-05-08 DIAGNOSIS — Z0289 Encounter for other administrative examinations: Secondary | ICD-10-CM

## 2019-05-08 DIAGNOSIS — M47812 Spondylosis without myelopathy or radiculopathy, cervical region: Secondary | ICD-10-CM | POA: Diagnosis not present

## 2019-05-08 DIAGNOSIS — R200 Anesthesia of skin: Secondary | ICD-10-CM | POA: Insufficient documentation

## 2019-05-08 NOTE — Progress Notes (Signed)
Full Name: Teresa Lozano Gender: Female MRN #: [id number] Date of Birth: 06/15/1951    Visit Date: 05/08/2019 13:28 Age: 68 Years 0 Months Old Examining Physician: Arlice Colt, MD  Referring Physician: Arlice Colt, MD    HISTORY: She is a 68 year old woman who has experience shooting pain into the fingers and toes.  The legs are fairly symmetric though she notes more symptoms in the right hand than the left hand.  She denies any weakness in the arms or legs.  She reports some neck pain and back pain.  She has history of cervical fusion in the past.  NERVE CONDUCTION STUDIES: Bilateral median, bilateral ulnar ulnar and the right peroneal and tibial motor responses had normal distal latencies, amplitude and conduction velocities.  The tibial and ulnar are F-wave responses were normal.  The right sural, right superficial peroneal and bilateral median and ulnar sensory responses had normal peak latencies and amplitudes.  ELECTROMYOGRAPHY Needle EMG of selected muscles of the left leg and right arm was performed.  There was no spontaneous activity.  Motor unit morphology and recruitment was normal.  IMPRESSIONS: This NCV/EMG study shows the following: 1.    There is no evidence of polyneuropathy or mononeuropathy in the arms or leg. 2.    No evidence of radiculopathy in the left leg or right arm.  Minimal radiculopathies are not always detected on EMG.  Teresa Mehl A. Felecia Shelling, MD, PhD, FAAN Certified in Neurology, Clinical Neurophysiology, Sleep Medicine, Pain Medicine and Neuroimaging Director, [facility] [address] [place],  [address] [phone]         [facility]    Nerve / Sites Muscle Latency Ref. Amplitude Ref. Rel Amp Segments Distance Velocity Ref. Area    ms ms mV mV %  cm m/s m/s mVms  R Median - APB     Wrist APB 3.3 ?4.4 5.0 ?4.0 100 Wrist - APB 7   22.5     Upper  arm APB 6.9  4.9  99.6 Upper arm - Wrist 20 56 ?49 23.0  L Median - APB     Wrist APB 3.6 ?4.4 4.0 ?4.0 100 Wrist - APB 7   18.5     Upper arm APB 7.6  5.3  132 Upper arm - Wrist 20 51 ?49 19.9  R Ulnar - ADM     Wrist ADM 3.0 ?3.3 9.3 ?6.0 100 Wrist - ADM 7   33.3     B.Elbow ADM 6.2  7.2  78 B.Elbow - Wrist 18 56 ?49 25.8     A.Elbow ADM 8.0  7.2  100 A.Elbow - B.Elbow 10 56 ?49 26.1         A.Elbow - Wrist      L Ulnar - ADM     Wrist ADM 3.0 ?3.3 10.3 ?6.0 100 Wrist - ADM 7   37.1     B.Elbow ADM 6.0  8.1  78.2 B.Elbow - Wrist 18 60 ?49 31.6     A.Elbow ADM 7.8  8.0  99 A.Elbow - B.Elbow 10 58 ?49 31.1         A.Elbow - Wrist      R Peroneal - EDB     Ankle EDB 5.6 ?6.5 3.8 ?2.0 100 Ankle - EDB 9   17.5     Fib head EDB 11.3  3.1  82 Fib head - Ankle 26 46 ?  44 14.0     Pop fossa EDB 13.5  2.1  65.8 Pop fossa - Fib head 10 45 ?44 9.4         Pop fossa - Ankle      R Tibial - AH     Ankle AH 5.1 ?5.8 12.4 ?4.0 100 Ankle - AH 9   29.5     Pop fossa AH 13.4  9.6  77.4 Pop fossa - Ankle 35 42 ?41 27.7                  SNC    Nerve / Sites Rec. Site Peak Lat Ref.  Amp Ref. Segments Distance    ms ms V V  cm  R Sural - Ankle (Calf)     Calf Ankle 3.8 ?4.4 11 ?6 Calf - Ankle 14  R Superficial peroneal - Ankle     Lat leg Ankle 4.1 ?4.4 6 ?6 Lat leg - Ankle 14  R Median - Orthodromic (Dig II, Mid palm)     Dig II Wrist 3.2 ?3.4 13 ?10 Dig II - Wrist 13  L Median - Orthodromic (Dig II, Mid palm)     Dig II Wrist 3.3 ?3.4 18 ?10 Dig II - Wrist 13  R Ulnar - Orthodromic, (Dig V, Mid palm)     Dig V Wrist 2.9 ?3.1 11 ?5 Dig V - Wrist 11  L Ulnar - Orthodromic, (Dig V, Mid palm)     Dig V Wrist 3.0 ?3.1 8 ?5 Dig V - Wrist 86                 F  Wave    Nerve F Lat Ref.   ms ms  R Tibial - AH 49.2 ?56.0  R Ulnar - ADM 26.0 ?32.0  L Ulnar - ADM 26.0 ?32.0           EMG full       EMG Summary Table    Spontaneous MUAP Recruitment  Muscle IA Fib PSW Fasc Other Amp Dur. Poly  Pattern  L. Vastus medialis Normal None None None _______ Normal Normal Normal Normal  L. Tibialis anterior Normal None None None _______ Normal Normal Normal Normal  L. Peroneus longus Normal None None None _______ Normal Normal Normal Normal  L. Gastrocnemius (Medial head) Normal None None None _______ Normal Normal Normal Normal  L. Abductor hallucis Normal None None None _______ Normal Normal Normal Normal  R. Deltoid Normal None None None _______ Normal Normal Normal Normal  R. Triceps brachii Normal None None None _______ Normal Normal 1+ Normal  R. Biceps brachii Normal None None None _______ Normal Normal Normal Normal  R. Extensor digitorum communis Normal None None None _______ Normal Normal Normal Normal  R. First dorsal interosseous Normal None None None _______ Normal Normal Normal Normal

## 2019-05-30 ENCOUNTER — Other Ambulatory Visit: Payer: Self-pay | Admitting: Neurology

## 2019-07-02 ENCOUNTER — Other Ambulatory Visit: Payer: Self-pay | Admitting: Neurology

## 2019-07-04 ENCOUNTER — Other Ambulatory Visit: Payer: Self-pay | Admitting: Neurology

## 2019-07-25 ENCOUNTER — Other Ambulatory Visit: Payer: Self-pay | Admitting: Neurology

## 2019-08-09 DIAGNOSIS — I129 Hypertensive chronic kidney disease with stage 1 through stage 4 chronic kidney disease, or unspecified chronic kidney disease: Secondary | ICD-10-CM | POA: Diagnosis not present

## 2019-08-09 DIAGNOSIS — R809 Proteinuria, unspecified: Secondary | ICD-10-CM | POA: Diagnosis not present

## 2019-08-09 DIAGNOSIS — N183 Chronic kidney disease, stage 3 unspecified: Secondary | ICD-10-CM | POA: Diagnosis not present

## 2019-08-14 DIAGNOSIS — N3090 Cystitis, unspecified without hematuria: Secondary | ICD-10-CM | POA: Diagnosis not present

## 2019-08-14 DIAGNOSIS — N1831 Chronic kidney disease, stage 3a: Secondary | ICD-10-CM | POA: Diagnosis not present

## 2019-08-14 DIAGNOSIS — I10 Essential (primary) hypertension: Secondary | ICD-10-CM | POA: Diagnosis not present

## 2019-09-26 ENCOUNTER — Other Ambulatory Visit: Payer: Self-pay | Admitting: Neurology

## 2019-10-23 ENCOUNTER — Other Ambulatory Visit: Payer: Self-pay | Admitting: Neurology

## 2019-10-25 ENCOUNTER — Other Ambulatory Visit: Payer: Self-pay | Admitting: Neurology

## 2019-10-29 ENCOUNTER — Telehealth: Payer: Self-pay | Admitting: Neurology

## 2019-10-29 ENCOUNTER — Other Ambulatory Visit: Payer: Self-pay | Admitting: Neurology

## 2019-10-29 MED ORDER — SUMATRIPTAN SUCCINATE 100 MG PO TABS: ORAL_TABLET | ORAL | 11 refills | Status: DC

## 2019-10-29 NOTE — Telephone Encounter (Signed)
1) Medication(s) Requested (by name): SUMAtriptan (IMITREX) 100 MG tablet  butalbital-acetaminophen-caffeine (FIORICET) 50-325-40 MG tablet   2) [id number] [place], [place]  [address] [place] [address]

## 2019-10-29 NOTE — Telephone Encounter (Signed)
Called pt back. Advised last refill for sumatriptan sent 03/22/19 #10, 11 refills to [facility]. [facility] and [facility]. [facility] told her they did not have prescription. I called [facility]. They verified pt never picked up rx sumatriptan, rx was still on hold. Asked that they cx out of their system. [facility]. She last refilled sumatriptan 08/28/2019 prescribed by Dr. Felecia Shelling qty 9. Rx dated 2020. Has no remaining refills. [facility]  She also requested refill for fioricet. Advised that was last sent 05/30/19 #16, [facility]. She should have enough to get her through 11/27/18. Pt verbalized understanding and ended call.

## 2019-10-31 ENCOUNTER — Other Ambulatory Visit: Payer: Self-pay

## 2019-10-31 ENCOUNTER — Encounter: Payer: Self-pay | Admitting: Internal Medicine

## 2019-10-31 ENCOUNTER — Ambulatory Visit (INDEPENDENT_AMBULATORY_CARE_PROVIDER_SITE_OTHER): Payer: Medicare Other | Admitting: Internal Medicine

## 2019-10-31 VITALS — BP 118/76 | HR 98 | Temp 97.2°F | Wt 104.0 lb

## 2019-10-31 DIAGNOSIS — W57XXXA Bitten or stung by nonvenomous insect and other nonvenomous arthropods, initial encounter: Secondary | ICD-10-CM

## 2019-10-31 DIAGNOSIS — A6920 Lyme disease, unspecified: Secondary | ICD-10-CM

## 2019-10-31 DIAGNOSIS — S30861A Insect bite (nonvenomous) of abdominal wall, initial encounter: Secondary | ICD-10-CM

## 2019-10-31 MED ORDER — DOXYCYCLINE HYCLATE 100 MG PO TABS: 100.0000 mg | ORAL_TABLET | Freq: Two times a day (BID) | ORAL | 0 refills | Status: DC

## 2019-10-31 NOTE — Progress Notes (Signed)
Subjective:    Patient ID: Teresa Lozano, female    DOB: 11-18-1950, 69 y.o.   MRN: [id number]  HPI  Pt presents to the clinic today with c/o a tick bite. This occurred 1 week ago. She reports increased redness surrounding the area and a headache. The headache is located in the back of her head and radiates into her neck. She describes the pain as achy. She denies dizziness, visual changes, sensitivity to light or sound, nausea or vomiting. She denies joint pain, fatigue or diarrhea. She denies fever, chills or body aches. She has not tried anything OTC for her symptoms.  Review of Systems      Past Medical History:  Diagnosis Date  . Allergy   . Arthritis   . Chicken pox   . Frequent headaches   . GERD (gastroesophageal reflux disease)   . History of kidney stones   . Hyperlipidemia   . Hypertension   . Osteoporosis     Current Outpatient Medications  Medication Sig Dispense Refill  . levETIRAcetam (KEPPRA) 500 MG tablet TAKE 1 TABLET BY MOUTH TWICE A DAY 180 tablet 3  . amLODipine (NORVASC) 10 MG tablet Take 1 tablet (10 mg total) by mouth daily. 90 tablet 3  . atorvastatin (LIPITOR) 20 MG tablet Take 1 tablet (20 mg total) by mouth daily. 90 tablet 0  . butalbital-acetaminophen-caffeine (FIORICET) 50-325-40 MG tablet TAKE 1 TO 2 TABLETS BY MOUTH AS NEEDED HEADACHE. NO MORE THAN 16 PILLS A MONTH 16 tablet 5  . CALCIUM PO Take 800 mg by mouth daily.    . cetirizine (ZYRTEC) 10 MG tablet Take 10 mg by mouth daily.    Marland Kitchen HYDROcodone-acetaminophen (NORCO/VICODIN) 5-325 MG tablet Take 1 tablet by mouth every 6 (six) hours as needed for moderate pain. 20 tablet 0  . imipramine (TOFRANIL) 25 MG tablet TAKE 3 TABLETS (75 MG TOTAL) BY MOUTH AT BEDTIME. 270 tablet 2  . Magnesium 500 MG CAPS Take 500 mg by mouth 2 (two) times a week.     . Multiple Vitamin (MULTI-VITAMIN DAILY PO) Take by mouth.    Marland Kitchen omeprazole (PRILOSEC) 40 MG capsule Take 1 capsule (40 mg total) by mouth daily. 60  capsule 5  . ondansetron (ZOFRAN) 4 MG tablet Take 1 tablet (4 mg total) by mouth every 8 (eight) hours as needed. 20 tablet 5  . polyethylene glycol (MIRALAX / GLYCOLAX) packet Take 17 g by mouth daily.    . SUMAtriptan (IMITREX) 100 MG tablet TAKE 1 TAB AS NEEDED FOR MIGRAINE. MAY REPEAT IN 2 HRS IF HEADACHE PERSISTS OR RECURS. DNF 6/19 10 tablet 11  . tamsulosin (FLOMAX) 0.4 MG CAPS capsule TAKE 1 CAPSULE (0.4 MG TOTAL) BY MOUTH DAILY AS NEEDED (KIDNEY STONE). 15 capsule 1  . VITAMIN D PO Take 5,000 Units by mouth daily.     Current Facility-Administered Medications  Medication Dose Route Frequency Provider Last Rate Last Admin  . 0.9 %  sodium chloride infusion  500 mL Intravenous Once Armbruster, Carlota Raspberry, MD        Allergies  Allergen Reactions  . Robaxin [Methocarbamol] Nausea And Vomiting  . Lisinopril Cough    Family History  Problem Relation Age of Onset  . Hyperlipidemia Mother   . Hypertension Mother   . Prostate cancer Father   . Hyperlipidemia Father   . Heart disease Father   . Heart disease Brother   . Hypertension Sister   . Esophageal cancer Sister   .  Hypertension Maternal Grandmother   . Colon cancer Neg Hx   . Colon polyps Neg Hx   . Stomach cancer Neg Hx   . Rectal cancer Neg Hx     Social History   Socioeconomic History  . Marital status: Widowed    Spouse name: Not on file  . Number of children: Not on file  . Years of education: Not on file  . Highest education level: Not on file  Occupational History  . Not on file  Tobacco Use  . Smoking status: Never Smoker  . Smokeless tobacco: Never Used  Substance and Sexual Activity  . Alcohol use: No    Alcohol/week: 0.0 standard drinks  . Drug use: No  . Sexual activity: Never  Other Topics Concern  . Not on file  Social History Narrative  . Not on file   Social Determinants of Health   Financial Resource Strain:   . Difficulty of Paying Living Expenses:   Food Insecurity:   . Worried  About Charity fundraiser in the Last Year:   . Arboriculturist in the Last Year:   Transportation Needs:   . Film/video editor (Medical):   Marland Kitchen Lack of Transportation (Non-Medical):   Physical Activity:   . Days of Exercise per Week:   . Minutes of Exercise per Session:   Stress:   . Feeling of Stress :   Social Connections:   . Frequency of Communication with Friends and Family:   . Frequency of Social Gatherings with Friends and Family:   . Attends Religious Services:   . Active Member of Clubs or Organizations:   . Attends Archivist Meetings:   Marland Kitchen Marital Status:   Intimate Partner Violence:   . Fear of Current or Ex-Partner:   . Emotionally Abused:   Marland Kitchen Physically Abused:   . Sexually Abused:      Constitutional: Pt reports headache. Denies fever, malaise, fatigue, or abrupt weight changes.  HEENT: Denies eye pain, eye redness, ear pain, ringing in the ears, wax buildup, runny nose, nasal congestion, bloody nose, or sore throat. Respiratory: Denies difficulty breathing, shortness of breath, cough or sputum production.   Cardiovascular: Denies chest pain, chest tightness, palpitations or swelling in the hands or feet.  Gastrointestinal: Denies abdominal pain, bloating, constipation, diarrhea or blood in the stool.  Musculoskeletal: Denies decrease in range of motion, difficulty with gait, muscle pain or joint pain and swelling.  Skin: Pt reports tick bite with surrounding redness. Denies rashes, lesions or ulcercations.  Neurological: Denies dizziness, difficulty with memory, difficulty with speech or problems with balance and coordination.   No other specific complaints in a complete review of systems (except as listed in HPI above).  Objective:   Physical Exam   BP 118/76   Pulse 98   Temp (!) 97.2 F (36.2 C) (Temporal)   Wt 104 lb (47.2 kg)   SpO2 98%   BMI 19.02 kg/m   Wt Readings from Last 3 Encounters:  04/05/19 100 lb (45.4 kg)  04/01/19 103  lb (46.7 kg)  03/22/19 103 lb 8 oz (46.9 kg)    General: Appears her stated age, in NAD. Skin:Erythema migrans noted of left lower abdomen. Cardiovascular: Normal rate and rhythm.  Pulmonary/Chest: Normal effort and positive vesicular breath sounds. No respiratory distress. No wheezes, rales or ronchi noted.  Musculoskeletal:  No difficulty with gait.  Neurological: Alert and oriented.    BMET    Component Value Date/Time  NA 140 04/05/2019 1036   K 4.7 04/05/2019 1036   CL 102 04/05/2019 1036   CO2 34 (H) 04/05/2019 1036   GLUCOSE 78 04/05/2019 1036   BUN 16 04/05/2019 1036   CREATININE 1.29 (H) 04/05/2019 1036   CALCIUM 9.7 04/05/2019 1036   GFRNONAA 49 (L) 12/17/2009 0855   GFRAA (L) 12/17/2009 0855    59        The eGFR has been calculated using the MDRD equation. This calculation has not been validated in all clinical situations. eGFR's persistently <60 mL/min signify possible Chronic Kidney Disease.    Lipid Panel     Component Value Date/Time   CHOL 168 04/05/2019 1036   TRIG 140.0 04/05/2019 1036   HDL 55.10 04/05/2019 1036   CHOLHDL 3 04/05/2019 1036   VLDL 28.0 04/05/2019 1036   LDLCALC 85 04/05/2019 1036    CBC    Component Value Date/Time   WBC 6.6 04/05/2019 1036   RBC 4.48 04/05/2019 1036   HGB 14.6 04/05/2019 1036   HCT 44.6 04/05/2019 1036   PLT 274.0 04/05/2019 1036   MCV 99.5 04/05/2019 1036   MCHC 32.8 04/05/2019 1036   RDW 12.9 04/05/2019 1036   LYMPHSABS 2.3 12/17/2009 0855   MONOABS 0.7 12/17/2009 0855   EOSABS 0.2 12/17/2009 0855   BASOSABS 0.0 12/17/2009 0855    Hgb A1C No results found for: HGBA1C        Assessment & Plan:   Erythema Migrans secondary to Tick Bite:  RX for Doxycycline 100 mg BID x 14 days  Return precautions discussed Webb Silversmith, NP This visit occurred during the SARS-CoV-2 public health emergency.  Safety protocols were in place, including screening questions prior to the visit, additional  usage of staff PPE, and extensive cleaning of exam room while observing appropriate contact time as indicated for disinfecting solutions.

## 2019-10-31 NOTE — Patient Instructions (Signed)

## 2019-11-19 ENCOUNTER — Other Ambulatory Visit: Payer: Self-pay | Admitting: Neurology

## 2019-11-19 DIAGNOSIS — R1013 Epigastric pain: Secondary | ICD-10-CM

## 2019-12-24 ENCOUNTER — Encounter: Payer: Self-pay | Admitting: Internal Medicine

## 2019-12-24 ENCOUNTER — Other Ambulatory Visit: Payer: Self-pay

## 2019-12-24 ENCOUNTER — Ambulatory Visit (INDEPENDENT_AMBULATORY_CARE_PROVIDER_SITE_OTHER): Payer: Medicare Other | Admitting: Internal Medicine

## 2019-12-24 VITALS — BP 118/82 | HR 110 | Temp 98.1°F | Wt 106.0 lb

## 2019-12-24 DIAGNOSIS — S81801A Unspecified open wound, right lower leg, initial encounter: Secondary | ICD-10-CM | POA: Diagnosis not present

## 2019-12-24 DIAGNOSIS — R300 Dysuria: Secondary | ICD-10-CM | POA: Diagnosis not present

## 2019-12-24 DIAGNOSIS — R7309 Other abnormal glucose: Secondary | ICD-10-CM

## 2019-12-24 LAB — POC URINALSYSI DIPSTICK (AUTOMATED)
Bilirubin, UA: NEGATIVE
Blood, UA: NEGATIVE
Glucose, UA: POSITIVE — AB
Leukocytes, UA: NEGATIVE
Nitrite, UA: NEGATIVE
Protein, UA: POSITIVE — AB
Spec Grav, UA: 1.025 (ref 1.010–1.025)
Urobilinogen, UA: 0.2 E.U./dL
pH, UA: 6 (ref 5.0–8.0)

## 2019-12-24 LAB — POCT GLYCOSYLATED HEMOGLOBIN (HGB A1C): Hemoglobin A1C: 5.2 % (ref 4.0–5.6)

## 2019-12-24 MED ORDER — PHENAZOPYRIDINE HCL 200 MG PO TABS: 200.0000 mg | ORAL_TABLET | Freq: Three times a day (TID) | ORAL | 0 refills | Status: DC | PRN

## 2019-12-24 NOTE — Progress Notes (Signed)
HPI  Pt presents to the clinic today with bladder pressure, dysuria and low back pain. This started 1 week ago. She denies urgency, frequency, urine ordor or blood in her urine. She denies vaginal discharge, odor, irritation or abnormal uterine bleeding. She denies fever, chills or nausea. She has tried cranberry pills without any relief.  She is also concerned about a non healing wound to her right lower extremity. This occurred 3 weeks ago. She has not noticed any drainage but has noticed some redness.  Review of Systems  Past Medical History:  Diagnosis Date  . Allergy   . Arthritis   . Chicken pox   . Frequent headaches   . GERD (gastroesophageal reflux disease)   . History of kidney stones   . Hyperlipidemia   . Hypertension   . Osteoporosis     Family History  Problem Relation Age of Onset  . Hyperlipidemia Mother   . Hypertension Mother   . Prostate cancer Father   . Hyperlipidemia Father   . Heart disease Father   . Heart disease Brother   . Hypertension Sister   . Esophageal cancer Sister   . Hypertension Maternal Grandmother   . Colon cancer Neg Hx   . Colon polyps Neg Hx   . Stomach cancer Neg Hx   . Rectal cancer Neg Hx     Social History   Socioeconomic History  . Marital status: Widowed    Spouse name: Not on file  . Number of children: Not on file  . Years of education: Not on file  . Highest education level: Not on file  Occupational History  . Not on file  Tobacco Use  . Smoking status: Never Smoker  . Smokeless tobacco: Never Used  Substance and Sexual Activity  . Alcohol use: No    Alcohol/week: 0.0 standard drinks  . Drug use: No  . Sexual activity: Never  Other Topics Concern  . Not on file  Social History Narrative  . Not on file   Social Determinants of Health   Financial Resource Strain:   . Difficulty of Paying Living Expenses:   Food Insecurity:   . Worried About Charity fundraiser in the Last Year:   . Arboriculturist in  the Last Year:   Transportation Needs:   . Film/video editor (Medical):   Marland Kitchen Lack of Transportation (Non-Medical):   Physical Activity:   . Days of Exercise per Week:   . Minutes of Exercise per Session:   Stress:   . Feeling of Stress :   Social Connections:   . Frequency of Communication with Friends and Family:   . Frequency of Social Gatherings with Friends and Family:   . Attends Religious Services:   . Active Member of Clubs or Organizations:   . Attends Archivist Meetings:   Marland Kitchen Marital Status:   Intimate Partner Violence:   . Fear of Current or Ex-Partner:   . Emotionally Abused:   Marland Kitchen Physically Abused:   . Sexually Abused:     Allergies  Allergen Reactions  . Robaxin [Methocarbamol] Nausea And Vomiting  . Lisinopril Cough     Constitutional: Denies fever, malaise, fatigue, headache or abrupt weight changes.   GU: Pt reports bladder pressure, low back pain, pain with urination. Denies urgency, frequency, burning sensation, blood in urine, odor or discharge. Skin: Pt reports non healing wound to RLE. Denies ulcercations.   No other specific complaints in a complete review of  systems (except as listed in HPI above).    Objective:   Physical Exam  BP 118/82   Pulse (!) 110   Temp 98.1 F (36.7 C) (Temporal)   Wt 106 lb (48.1 kg)   SpO2 98%   BMI 19.39 kg/m   Wt Readings from Last 3 Encounters:  10/31/19 104 lb (47.2 kg)  04/05/19 100 lb (45.4 kg)  04/01/19 103 lb (46.7 kg)    General: Appears her stated age, well developed, well nourished in NAD. Skin: 3 liner abrasions noted over right anterior shin. Cardiovascular: Tachycardic with normal rhythm. S1,S2 noted.   Pulmonary/Chest: Normal effort and positive vesicular breath sounds. No respiratory distress. No wheezes, rales or ronchi noted.  Abdomen: Soft. Normal bowel sounds. No distention or masses noted.  Tender to palpation over the bladder area. Mild CVA tenderness noted on the  right.        Assessment & Plan:   Bladder Pressure, Low Back Pain, Dysuria:  Urinalysis: + ketone's, + bilirubin Will send urine culture eRx for Pyridium 200 mg TID prn Drink plenty of fluids  Nonhealing Wound RLE, Elevated Random Glucose:  Will check A1C today  RTC as needed or if symptoms persist. Webb Silversmith, NP This visit occurred during the SARS-CoV-2 public health emergency.  Safety protocols were in place, including screening questions prior to the visit, additional usage of staff PPE, and extensive cleaning of exam room while observing appropriate contact time as indicated for disinfecting solutions.

## 2019-12-24 NOTE — Patient Instructions (Signed)

## 2019-12-24 NOTE — Addendum Note (Signed)
Addended by: Lindalou Hose Y on: 12/24/2019 04:00 PM   Modules accepted: Orders

## 2019-12-26 LAB — URINE CULTURE
MICRO NUMBER:: 10560774
SPECIMEN QUALITY:: ADEQUATE

## 2019-12-26 MED ORDER — NITROFURANTOIN MONOHYD MACRO 100 MG PO CAPS: 100.0000 mg | ORAL_CAPSULE | Freq: Two times a day (BID) | ORAL | 0 refills | Status: DC

## 2019-12-26 NOTE — Addendum Note (Signed)
Addended by: [name] on: 12/26/2019 01:55 PM   Modules accepted: Orders

## 2020-02-10 ENCOUNTER — Other Ambulatory Visit: Payer: Self-pay | Admitting: Internal Medicine

## 2020-02-11 DIAGNOSIS — N183 Chronic kidney disease, stage 3 unspecified: Secondary | ICD-10-CM | POA: Insufficient documentation

## 2020-02-11 DIAGNOSIS — R809 Proteinuria, unspecified: Secondary | ICD-10-CM | POA: Insufficient documentation

## 2020-02-18 DIAGNOSIS — N1831 Chronic kidney disease, stage 3a: Secondary | ICD-10-CM | POA: Diagnosis not present

## 2020-02-18 DIAGNOSIS — I10 Essential (primary) hypertension: Secondary | ICD-10-CM | POA: Diagnosis not present

## 2020-03-20 ENCOUNTER — Ambulatory Visit (INDEPENDENT_AMBULATORY_CARE_PROVIDER_SITE_OTHER): Payer: Medicare Other

## 2020-03-20 ENCOUNTER — Other Ambulatory Visit: Payer: Self-pay

## 2020-03-20 ENCOUNTER — Encounter (HOSPITAL_COMMUNITY): Payer: Self-pay | Admitting: Emergency Medicine

## 2020-03-20 ENCOUNTER — Telehealth: Payer: Medicare Other | Admitting: Internal Medicine

## 2020-03-20 ENCOUNTER — Ambulatory Visit (HOSPITAL_COMMUNITY)
Admission: EM | Admit: 2020-03-20 | Discharge: 2020-03-20 | Disposition: A | Discharge status: Home / Self Care | Payer: Medicare Other | Attending: Family Medicine | Admitting: Family Medicine

## 2020-03-20 DIAGNOSIS — K259 Gastric ulcer, unspecified as acute or chronic, without hemorrhage or perforation: Secondary | ICD-10-CM | POA: Insufficient documentation

## 2020-03-20 DIAGNOSIS — M1990 Unspecified osteoarthritis, unspecified site: Secondary | ICD-10-CM | POA: Insufficient documentation

## 2020-03-20 DIAGNOSIS — R197 Diarrhea, unspecified: Secondary | ICD-10-CM | POA: Diagnosis not present

## 2020-03-20 DIAGNOSIS — E860 Dehydration: Secondary | ICD-10-CM | POA: Diagnosis not present

## 2020-03-20 DIAGNOSIS — Z792 Long term (current) use of antibiotics: Secondary | ICD-10-CM | POA: Insufficient documentation

## 2020-03-20 DIAGNOSIS — M810 Age-related osteoporosis without current pathological fracture: Secondary | ICD-10-CM | POA: Diagnosis not present

## 2020-03-20 DIAGNOSIS — Z888 Allergy status to other drugs, medicaments and biological substances status: Secondary | ICD-10-CM | POA: Diagnosis not present

## 2020-03-20 DIAGNOSIS — Z20822 Contact with and (suspected) exposure to covid-19: Secondary | ICD-10-CM | POA: Diagnosis not present

## 2020-03-20 DIAGNOSIS — R112 Nausea with vomiting, unspecified: Secondary | ICD-10-CM | POA: Insufficient documentation

## 2020-03-20 DIAGNOSIS — R05 Cough: Secondary | ICD-10-CM

## 2020-03-20 DIAGNOSIS — R1110 Vomiting, unspecified: Secondary | ICD-10-CM | POA: Diagnosis not present

## 2020-03-20 DIAGNOSIS — K219 Gastro-esophageal reflux disease without esophagitis: Secondary | ICD-10-CM | POA: Insufficient documentation

## 2020-03-20 DIAGNOSIS — R7401 Elevation of levels of liver transaminase levels: Secondary | ICD-10-CM | POA: Diagnosis not present

## 2020-03-20 DIAGNOSIS — E785 Hyperlipidemia, unspecified: Secondary | ICD-10-CM | POA: Insufficient documentation

## 2020-03-20 DIAGNOSIS — R748 Abnormal levels of other serum enzymes: Secondary | ICD-10-CM | POA: Diagnosis not present

## 2020-03-20 DIAGNOSIS — R059 Cough, unspecified: Secondary | ICD-10-CM

## 2020-03-20 DIAGNOSIS — M5416 Radiculopathy, lumbar region: Secondary | ICD-10-CM | POA: Insufficient documentation

## 2020-03-20 DIAGNOSIS — I10 Essential (primary) hypertension: Secondary | ICD-10-CM | POA: Insufficient documentation

## 2020-03-20 DIAGNOSIS — Z79899 Other long term (current) drug therapy: Secondary | ICD-10-CM | POA: Diagnosis not present

## 2020-03-20 LAB — CBC WITH DIFFERENTIAL/PLATELET
Abs Immature Granulocytes: 0.04 10*3/uL (ref 0.00–0.07)
Basophils Absolute: 0 10*3/uL (ref 0.0–0.1)
Basophils Relative: 0 %
Eosinophils Absolute: 0 10*3/uL (ref 0.0–0.5)
Eosinophils Relative: 0 %
HCT: 47.6 % — ABNORMAL HIGH (ref 36.0–46.0)
Hemoglobin: 15.4 g/dL — ABNORMAL HIGH (ref 12.0–15.0)
Immature Granulocytes: 0 %
Lymphocytes Relative: 5 %
Lymphs Abs: 0.5 10*3/uL — ABNORMAL LOW (ref 0.7–4.0)
MCH: 31.2 pg (ref 26.0–34.0)
MCHC: 32.4 g/dL (ref 30.0–36.0)
MCV: 96.4 fL (ref 80.0–100.0)
Monocytes Absolute: 0.4 10*3/uL (ref 0.1–1.0)
Monocytes Relative: 4 %
Neutro Abs: 10.3 10*3/uL — ABNORMAL HIGH (ref 1.7–7.7)
Neutrophils Relative %: 91 %
Platelets: 245 10*3/uL (ref 150–400)
RBC: 4.94 MIL/uL (ref 3.87–5.11)
RDW: 12.9 % (ref 11.5–15.5)
WBC: 11.3 10*3/uL — ABNORMAL HIGH (ref 4.0–10.5)
nRBC: 0 % (ref 0.0–0.2)

## 2020-03-20 LAB — COMPREHENSIVE METABOLIC PANEL
ALT: 49 U/L — ABNORMAL HIGH (ref 0–44)
AST: 36 U/L (ref 15–41)
Albumin: 3.2 g/dL — ABNORMAL LOW (ref 3.5–5.0)
Alkaline Phosphatase: 234 U/L — ABNORMAL HIGH (ref 38–126)
Anion gap: 12 (ref 5–15)
BUN: 29 mg/dL — ABNORMAL HIGH (ref 8–23)
CO2: 25 mmol/L (ref 22–32)
Calcium: 8.9 mg/dL (ref 8.9–10.3)
Chloride: 102 mmol/L (ref 98–111)
Creatinine, Ser: 1.34 mg/dL — ABNORMAL HIGH (ref 0.44–1.00)
GFR calc Af Amer: 47 mL/min — ABNORMAL LOW (ref >60)
GFR calc non Af Amer: 41 mL/min — ABNORMAL LOW (ref >60)
Glucose, Bld: 211 mg/dL — ABNORMAL HIGH (ref 70–99)
Potassium: 4.4 mmol/L (ref 3.5–5.1)
Sodium: 139 mmol/L (ref 135–145)
Total Bilirubin: 0.5 mg/dL (ref 0.3–1.2)
Total Protein: 6.2 g/dL — ABNORMAL LOW (ref 6.5–8.1)

## 2020-03-20 LAB — POCT URINALYSIS DIPSTICK, ED / UC
Glucose, UA: NEGATIVE mg/dL
Hgb urine dipstick: NEGATIVE
Ketones, ur: NEGATIVE mg/dL
Leukocytes,Ua: NEGATIVE
Nitrite: NEGATIVE
Protein, ur: 30 mg/dL — AB
Specific Gravity, Urine: >=1.03 (ref 1.005–1.030)
Urobilinogen, UA: 1 mg/dL (ref 0.0–1.0)
pH: 5.5 (ref 5.0–8.0)

## 2020-03-20 LAB — SARS CORONAVIRUS 2 (TAT 6-24 HRS): SARS Coronavirus 2: NEGATIVE

## 2020-03-20 MED ORDER — SODIUM CHLORIDE 0.9 % IV BOLUS
1000.0000 mL | Freq: Once | INTRAVENOUS | Status: AC
  Administered 2020-03-20: 1000 mL via INTRAVENOUS

## 2020-03-20 NOTE — Discharge Instructions (Addendum)
We have rehydrated you here today.  Your blood work had some abnormalities  There is concern for covid based on labs and symptoms May also be gallbladder or liver issue.  I would recommend see primary care Call them to schedule.  Other wise if symptoms worsen go to the ER.  Zofran as needed

## 2020-03-20 NOTE — ED Triage Notes (Signed)
PT reports she was up all night with vomiting and diarrhea. Also reports bodyaches, cough, sore throat. Has had no COVID vaccines, would like testing.

## 2020-03-20 NOTE — ED Provider Notes (Signed)
[place]    CSN: [id number] Arrival date & time: 03/20/20  0845      History   Chief Complaint Chief Complaint  Patient presents with   Emesis   Diarrhea    HPI Teresa Lozano is a 69 y.o. female.   Patient is a 69 year old female presents today with nausea, vomiting, diarrhea, sore throat, body aches, cough.  Started as a sore throat on Friday and has progressed.  Symptoms have been constant.  She feels weak, fatigued, chilled.  Has not been vaccinated.  Denies any specific abdominal pain, dysuria, hematuria or urinary frequency. No chest pain, SOB.      Past Medical History:  Diagnosis Date   Allergy    Arthritis    Chicken pox    Frequent headaches    GERD (gastroesophageal reflux disease)    History of kidney stones    Hyperlipidemia    Hypertension    Osteoporosis     Patient Active Problem List   Diagnosis Date Noted   History of kidney stones 04/05/2019   Lumbar radiculopathy 03/22/2019   Cervical facet joint syndrome 09/19/2018   Chronic prepyloric ulcer with gastric outlet obstruction s/p distal gastrectomy 05/04/2017 05/04/2017   Age-related osteoporosis without current pathological fracture 04/26/2017   Arthritis 12/06/2016   HLD (hyperlipidemia) 12/01/2015   Essential hypertension 11/10/2015   Frequent headaches 11/10/2015   GERD (gastroesophageal reflux disease) 11/10/2015    Past Surgical History:  Procedure Laterality Date   CERVICAL FUSION  2000   COLONOSCOPY     ESOPHAGOGASTRODUODENOSCOPY  11/2016   ESOPHAGOGASTRODUODENOSCOPY (EGD) WITH PROPOFOL N/A 12/28/2016   Procedure: ESOPHAGOGASTRODUODENOSCOPY (EGD) WITH PROPOFOL;  Surgeon: Manus Gunning, MD;  Location: [facility];  Service: Gastroenterology;  Laterality: N/A;   EYE SURGERY     STOMACH SURGERY  04/2017   REMOVED ULCER   TONSILLECTOMY     UPPER GASTROINTESTINAL ENDOSCOPY      OB History   No obstetric history on file.       Home Medications    Prior to Admission medications   Medication Sig Start Date End Date Taking? Authorizing Provider  Acetaminophen (TYLENOL ARTHRITIS PAIN PO) Take by mouth.    [provider]  amLODipine (NORVASC) 10 MG tablet Take 1 tablet (10 mg total) by mouth daily. MUST SCHEDULE PHYSICAL 02/11/20   [name], NP  atorvastatin (LIPITOR) 20 MG tablet Take 1 tablet (20 mg total) by mouth daily. 08/10/18   [name], NP  butalbital-acetaminophen-caffeine (FIORICET) 50-325-40 MG tablet TAKE 1 TO 2 TABLETS BY MOUTH AS NEEDED HEADACHE. NO MORE THAN 16 PILLS A MONTH 11/19/19   Sater, Nanine Means, MD  CALCIUM PO Take 800 mg by mouth daily.    [provider]  cetirizine (ZYRTEC) 10 MG tablet Take 10 mg by mouth daily.    [provider]  imipramine (TOFRANIL) 25 MG tablet TAKE 3 TABLETS (75 MG TOTAL) BY MOUTH AT BEDTIME. 07/25/19   Sater, Nanine Means, MD  levETIRAcetam (KEPPRA) 500 MG tablet TAKE 1 TABLET BY MOUTH TWICE A DAY 07/02/19   Sater, Nanine Means, MD  Magnesium 500 MG CAPS Take 500 mg by mouth 2 (two) times a week.     [provider]  Multiple Vitamin (MULTI-VITAMIN DAILY PO) Take by mouth.    [provider]  nitrofurantoin, macrocrystal-monohydrate, (MACROBID) 100 MG capsule Take 1 capsule (100 mg total) by mouth 2 (two) times daily. 12/26/19   [name], NP  omeprazole (PRILOSEC) 40 MG capsule Take 1 capsule (40 mg total) by mouth daily. 05/08/17   [name], MD  ondansetron (ZOFRAN) 4 MG tablet TAKE 1 TABLET BY MOUTH EVERY 8 HOURS AS NEEDED 11/19/19   Sater, Nanine Means, MD  phenazopyridine (PYRIDIUM) 200 MG tablet Take 1 tablet (200 mg total) by mouth 3 (three) times daily as needed for pain. 12/24/19   [name], NP  polyethylene glycol (MIRALAX / GLYCOLAX) packet Take 17 g by mouth daily.    [provider]  SUMAtriptan (IMITREX) 100 MG tablet TAKE 1 TAB AS NEEDED FOR MIGRAINE. MAY REPEAT IN 2 HRS IF HEADACHE  PERSISTS OR RECURS. DNF 6/19 10/29/19   Sater, Nanine Means, MD  tamsulosin (FLOMAX) 0.4 MG CAPS capsule TAKE 1 CAPSULE (0.4 MG TOTAL) BY MOUTH DAILY AS NEEDED (KIDNEY STONE). 11/25/18   [name], NP  VITAMIN D PO Take 5,000 Units by mouth daily.    [provider]    Family History Family History  Problem Relation Age of Onset   Hyperlipidemia Mother    Hypertension Mother    Prostate cancer Father    Hyperlipidemia Father    Heart disease Father    Heart disease Brother    Hypertension Sister    Esophageal cancer Sister    Hypertension Maternal Grandmother    Colon cancer Neg Hx    Colon polyps Neg Hx    Stomach cancer Neg Hx    Rectal cancer Neg Hx     Social History Social History   Tobacco Use   Smoking status: Never Smoker   Smokeless tobacco: Never Used  Scientific laboratory technician Use: Never used  Substance Use Topics   Alcohol use: No    Alcohol/week: 0.0 standard drinks   Drug use: No     Allergies   Robaxin [methocarbamol] and Lisinopril   Review of Systems Review of Systems   Physical Exam Triage Vital Signs ED Triage Vitals  Enc Vitals Group     BP 03/20/20 0934 118/74     Pulse Rate 03/20/20 0934 (!) 129     Resp 03/20/20 0934 16     Temp 03/20/20 0934 98.6 F (37 C)     Temp Source 03/20/20 0934 Oral     SpO2 03/20/20 0934 96 %     Weight --      Height --      Head Circumference --      Peak Flow --      Pain Score 03/20/20 0932 6     Pain Loc --      Pain Edu? --      Excl. in [place]? --    No data found.  Updated Vital Signs BP 118/74    Pulse (!) 129    Temp 98.6 F (37 C) (Oral)    Resp 16    SpO2 96%   Visual Acuity Right Eye Distance:   Left Eye Distance:   Bilateral Distance:    Right Eye Near:   Left Eye Near:    Bilateral Near:     Physical Exam Constitutional:      General: She is not in acute distress.    Appearance: She is ill-appearing. She is not toxic-appearing.  HENT:     Head:  Normocephalic and atraumatic.     Nose: Nose normal.     Mouth/Throat:     Pharynx: Oropharynx is clear.  Cardiovascular:     Rate and  Rhythm: Regular rhythm. Tachycardia present.     Heart sounds: Normal heart sounds.  Pulmonary:     Effort: Pulmonary effort is normal.     Breath sounds: Rhonchi present.     Comments: Rhonchi to left upper and lower  Musculoskeletal:     Cervical back: Normal range of motion.  Skin:    General: Skin is warm and dry.  Neurological:     Mental Status: She is alert.      UC Treatments / Results  Labs (all labs ordered are listed, but only abnormal results are displayed) Labs Reviewed  CBC WITH DIFFERENTIAL/PLATELET - Abnormal; Notable for the following components:      Result Value   WBC 11.3 (*)    Hemoglobin 15.4 (*)    HCT 47.6 (*)    Neutro Abs 10.3 (*)    Lymphs Abs 0.5 (*)    All other components within normal limits  COMPREHENSIVE METABOLIC PANEL - Abnormal; Notable for the following components:   Glucose, Bld 211 (*)    BUN 29 (*)    Creatinine, Ser 1.34 (*)    Total Protein 6.2 (*)    Albumin 3.2 (*)    ALT 49 (*)    Alkaline Phosphatase 234 (*)    GFR calc non Af Amer 41 (*)    GFR calc Af Amer 47 (*)    All other components within normal limits  POCT URINALYSIS DIPSTICK, ED / UC - Abnormal; Notable for the following components:   Bilirubin Urine MODERATE (*)    Protein, ur 30 (*)    All other components within normal limits  SARS CORONAVIRUS 2 (TAT 6-24 HRS)    EKG   Radiology DG Chest 2 View  Result Date: 03/20/2020 CLINICAL DATA:  Cough. EXAM: CHEST - 2 VIEW COMPARISON:  05/15/2009 FINDINGS: Heart size is normal. No pleural effusion or edema. No airspace opacifications identified bilaterally. Mild thoracolumbar curvature. IMPRESSION: No active cardiopulmonary disease. Electronically Signed   By: Kerby Moors M.D.   On: 03/20/2020 10:38    Procedures Procedures (including critical care time)  Medications  Ordered in UC Medications  sodium chloride 0.9 % bolus 1,000 mL (0 mLs Intravenous Stopped 03/20/20 1150)    Initial Impression / Assessment and Plan / UC Course  I have reviewed the triage vital signs and the nursing notes.  Pertinent labs & imaging results that were available during my care of the patient were reviewed by me and considered in my medical decision making (see chart for details).  Clinical Course as of Mar 22 1016  Thu Mar 20, 2020  1155 Abs Immature Granulocytes: 0.04 [TB]    Clinical Course User Index [TB] Loura Halt A, NP    Dehydration most likely from viral illness.  Highly concerned for Covid at this time. Patient x-ray without any acute findings. Blood work with mild elevated white blood cell count of 11.3 Urine without infection but moderate bili  Rehydrated here with NS bolus.  CMP somewhat concerning with elevated alkaline phos, elevated ALT and elevated WBC count. May be viral illness versus gall stones. She was not having any abdominal pain.  Decreased kidney function not much changed from prior.  Zofran as needed Hydrate  Recommended go to the ER for worsening illness, other wise see PCP for follow up.    Final Clinical Impressions(s) / UC Diagnoses   Final diagnoses:  Dehydration  Diarrhea, unspecified type  Cough     Discharge Instructions  We have rehydrated you here today.  Your blood work had some abnormalities  There is concern for covid based on labs and symptoms May also be gallbladder or liver issue.  I would recommend see primary care Call them to schedule.  Other wise if symptoms worsen go to the ER.  Zofran as needed      ED Prescriptions    None     PDMP not reviewed this encounter.   Loura Halt A, NP 03/22/20 1017

## 2020-03-21 NOTE — Progress Notes (Signed)
Pt went to UC/ER

## 2020-03-26 ENCOUNTER — Ambulatory Visit (INDEPENDENT_AMBULATORY_CARE_PROVIDER_SITE_OTHER): Payer: Medicare Other | Admitting: Neurology

## 2020-03-26 ENCOUNTER — Encounter: Payer: Self-pay | Admitting: Neurology

## 2020-03-26 ENCOUNTER — Other Ambulatory Visit: Payer: Self-pay

## 2020-03-26 VITALS — BP 110/60 | HR 110 | Ht 62.0 in | Wt 95.5 lb

## 2020-03-26 DIAGNOSIS — G43009 Migraine without aura, not intractable, without status migrainosus: Secondary | ICD-10-CM | POA: Diagnosis not present

## 2020-03-26 DIAGNOSIS — R200 Anesthesia of skin: Secondary | ICD-10-CM | POA: Diagnosis not present

## 2020-03-26 DIAGNOSIS — G44219 Episodic tension-type headache, not intractable: Secondary | ICD-10-CM | POA: Diagnosis not present

## 2020-03-26 DIAGNOSIS — M47812 Spondylosis without myelopathy or radiculopathy, cervical region: Secondary | ICD-10-CM | POA: Diagnosis not present

## 2020-03-26 MED ORDER — SUMATRIPTAN SUCCINATE 100 MG PO TABS
ORAL_TABLET | ORAL | 11 refills | Status: DC
Start: 2020-03-26 — End: 2021-03-26

## 2020-03-26 MED ORDER — BUTALBITAL-APAP-CAFFEINE 50-325-40 MG PO TABS
ORAL_TABLET | ORAL | 5 refills | Status: DC
Start: 2020-03-26 — End: 2020-09-23

## 2020-03-26 MED ORDER — IMIPRAMINE HCL 25 MG PO TABS: 75.0000 mg | ORAL_TABLET | Freq: Every day | ORAL | 3 refills | Status: DC

## 2020-03-26 MED ORDER — LEVETIRACETAM 500 MG PO TABS: 500.0000 mg | ORAL_TABLET | Freq: Two times a day (BID) | ORAL | 3 refills | Status: DC

## 2020-03-26 NOTE — Progress Notes (Signed)
[facility]  PATIENT: Teresa Lozano DOB: [birth year]  REFERRING DOCTOR OR PCP:  Webb Silversmith SOURCE: patient, notes from PCP  _________________________________   HISTORICAL  CHIEF COMPLAINT:  Chief Complaint  Patient presents with  . Follow-up    RM 12, alone. Last seen 05/08/19 for EMG and 03/22/19 for OV.   . Migraine    takes imitrex, imipramine, keppra. Migraines about the same as last visit.     HISTORY OF PRESENT ILLNESS:  Teresa Lozano is a 69 y.o. woman who has had headaches for 40 years.    Update 03/26/2020: She notes migraine headaches once a month but tension headaches are more frequently -once a week.   Most headaches are occipital and radiate forward.    She continues on imipramine 75 mg nightly and Keppra 500 mg p.o. twice daily.    She tolerates the medications well.   For migraines she takes sumatriptan and usually has a benefit.  For occipitla headaches she takes fioricet or  OTC Excedrin, often with benefit.     She had kidney stones so can't use topiramate or zonisamide.   She notes shooting pain in her fingers and toes.   This is better than last year.    Hand numbness often involves the ring fingers.     The 4th and 5th toes are more involved than the rest of the foot.   She has pain left > right.   NCV/EMG was normal last year.   She does not note weakness in the hand hands or feet.  She has some neck pain and back pain.  DATA CT has shown severe left facet changes at [facility] in 2011 and much milder facet hypertrophy to the right at [facility].   She has ACDF at [place] (Dr. Ronnald Ramp 2011)                                                                                                                      REVIEW OF SYSTEMS: Constitutional: No fevers, chills, sweats, or change in appetite Eyes: No visual changes, double vision, eye pain Ear, nose and throat: No hearing loss, ear pain, nasal congestion, sore throat Cardiovascular: No chest pain,  palpitations Respiratory: No shortness of breath at rest or with exertion.   No wheezes GastrointestinaI: No nausea, vomiting, diarrhea, abdominal pain, fecal incontinence Genitourinary: No dysuria, urinary retention or frequency.  No nocturia. Musculoskeletal: No neck pain, back pain Integumentary: No rash, pruritus, skin lesions Neurological: as above Psychiatric: No depression at this time.  No anxiety Endocrine: No palpitations, diaphoresis, change in appetite, change in weigh or increased thirst Hematologic/Lymphatic: No anemia, purpura, petechiae. Allergic/Immunologic: No itchy/runny eyes, nasal congestion, recent allergic reactions, rashes  ALLERGIES: Allergies  Allergen Reactions  . Robaxin [Methocarbamol] Nausea And Vomiting  . Lisinopril Cough    HOME MEDICATIONS:  Current Outpatient Medications:  .  Acetaminophen (TYLENOL ARTHRITIS PAIN PO), Take by mouth., Disp: , Rfl:  .  amLODipine (NORVASC) 10 MG tablet, Take  1 tablet (10 mg total) by mouth daily. MUST SCHEDULE PHYSICAL, Disp: 90 tablet, Rfl: 0 .  atorvastatin (LIPITOR) 20 MG tablet, Take 1 tablet (20 mg total) by mouth daily., Disp: 90 tablet, Rfl: 0 .  butalbital-acetaminophen-caffeine (FIORICET) 50-325-40 MG tablet, One to two po prn headache.   No mor ethan 4/week, Disp: 16 tablet, Rfl: 5 .  CALCIUM PO, Take 800 mg by mouth daily., Disp: , Rfl:  .  cetirizine (ZYRTEC) 10 MG tablet, Take 10 mg by mouth daily., Disp: , Rfl:  .  CRANBERRY PO, Take 2 capsules by mouth daily., Disp: , Rfl:  .  imipramine (TOFRANIL) 25 MG tablet, Take 3 tablets (75 mg total) by mouth at bedtime., Disp: 270 tablet, Rfl: 3 .  levETIRAcetam (KEPPRA) 500 MG tablet, Take 1 tablet (500 mg total) by mouth 2 (two) times daily., Disp: 180 tablet, Rfl: 3 .  Magnesium 500 MG CAPS, Take 500 mg by mouth 2 (two) times a week. , Disp: , Rfl:  .  Multiple Vitamin (MULTI-VITAMIN DAILY PO), Take by mouth., Disp: , Rfl:  .  omeprazole (PRILOSEC) 40 MG  capsule, Take 1 capsule (40 mg total) by mouth daily., Disp: 60 capsule, Rfl: 5 .  ondansetron (ZOFRAN) 4 MG tablet, TAKE 1 TABLET BY MOUTH EVERY 8 HOURS AS NEEDED, Disp: 20 tablet, Rfl: 5 .  polyethylene glycol (MIRALAX / GLYCOLAX) packet, Take 17 g by mouth daily., Disp: , Rfl:  .  SUMAtriptan (IMITREX) 100 MG tablet, TAKE 1 TAB AS NEEDED FOR MIGRAINE. MAY REPEAT IN 2 HRS IF HEADACHE PERSISTS OR RECURS. DNF 6/19, Disp: 10 tablet, Rfl: 11 .  tamsulosin (FLOMAX) 0.4 MG CAPS capsule, TAKE 1 CAPSULE (0.4 MG TOTAL) BY MOUTH DAILY AS NEEDED (KIDNEY STONE)., Disp: 15 capsule, Rfl: 1 .  VITAMIN D PO, Take 5,000 Units by mouth daily., Disp: , Rfl:   Current Facility-Administered Medications:  .  0.9 %  sodium chloride infusion, 500 mL, Intravenous, Once, Armbruster, Carlota Raspberry, MD  PAST MEDICAL HISTORY: Past Medical History:  Diagnosis Date  . Allergy   . Arthritis   . Chicken pox   . Frequent headaches   . GERD (gastroesophageal reflux disease)   . History of kidney stones   . Hyperlipidemia   . Hypertension   . Osteoporosis     PAST SURGICAL HISTORY: Past Surgical History:  Procedure Laterality Date  . CERVICAL FUSION  2000  . COLONOSCOPY    . ESOPHAGOGASTRODUODENOSCOPY  11/2016  . ESOPHAGOGASTRODUODENOSCOPY (EGD) WITH PROPOFOL N/A 12/28/2016   Procedure: ESOPHAGOGASTRODUODENOSCOPY (EGD) WITH PROPOFOL;  Surgeon: Manus Gunning, MD;  Location: [facility];  Service: Gastroenterology;  Laterality: N/A;  . EYE SURGERY    . STOMACH SURGERY  04/2017   REMOVED ULCER  . TONSILLECTOMY    . UPPER GASTROINTESTINAL ENDOSCOPY      FAMILY HISTORY: Family History  Problem Relation Age of Onset  . Hyperlipidemia Mother   . Hypertension Mother   . Prostate cancer Father   . Hyperlipidemia Father   . Heart disease Father   . Heart disease Brother   . Hypertension Sister   . Esophageal cancer Sister   . Hypertension Maternal Grandmother   . Colon cancer Neg Hx   . Colon polyps Neg  Hx   . Stomach cancer Neg Hx   . Rectal cancer Neg Hx     SOCIAL HISTORY:  Social History   Socioeconomic History  . Marital status: Widowed    Spouse name: Not on  file  . Number of children: Not on file  . Years of education: Not on file  . Highest education level: Not on file  Occupational History  . Not on file  Tobacco Use  . Smoking status: Never Smoker  . Smokeless tobacco: Never Used  Vaping Use  . Vaping Use: Never used  Substance and Sexual Activity  . Alcohol use: No    Alcohol/week: 0.0 standard drinks  . Drug use: No  . Sexual activity: Never  Other Topics Concern  . Not on file  Social History Narrative  . Not on file   Social Determinants of Health   Financial Resource Strain:   . Difficulty of Paying Living Expenses: Not on file  Food Insecurity:   . Worried About Charity fundraiser in the Last Year: Not on file  . Ran Out of Food in the Last Year: Not on file  Transportation Needs:   . Lack of Transportation (Medical): Not on file  . Lack of Transportation (Non-Medical): Not on file  Physical Activity:   . Days of Exercise per Week: Not on file  . Minutes of Exercise per Session: Not on file  Stress:   . Feeling of Stress : Not on file  Social Connections:   . Frequency of Communication with Friends and Family: Not on file  . Frequency of Social Gatherings with Friends and Family: Not on file  . Attends Religious Services: Not on file  . Active Member of Clubs or Organizations: Not on file  . Attends Archivist Meetings: Not on file  . Marital Status: Not on file  Intimate Partner Violence:   . Fear of Current or Ex-Partner: Not on file  . Emotionally Abused: Not on file  . Physically Abused: Not on file  . Sexually Abused: Not on file     PHYSICAL EXAM  Vitals:   03/26/20 0949  BP: 110/60  Pulse: (!) 110  Weight: 95 lb 8 oz (43.3 kg)  Height: 5\' 2"  (1.575 m)    Body mass index is 17.47 kg/m.   General: The patient  is well-developed and well-nourished and in no acute distress  Musculoskeletal: She has tenderness over the upper cervical paraspinal muscles/splenius capitus left > right  Neurologic Exam  Mental status: The patient is alert and oriented x 3 at the time of the examination. The patient has apparent normal recent and remote memory, with an apparently normal attention span and concentration ability.   Speech is normal.  Cranial nerves: Extraocular muscles are intact.  Facial strength and sensation is normal.  Trapezius strength is normal. . No obvious hearing deficits are noted.  Motor:  Muscle bulk is normal.   Tone is normal. Strength is  5 / 5 in all 4 extremities.   Sensory: Sensory testing is intact to touch  in all 4 extremities.  Coordination: finger nose finger is fine. .  Gait and station: Station is normal.   Gait and tandem gait are normal for age.  Reflexes: Deep tendon reflexes are symmetric and normal bilaterally.      DIAGNOSTIC DATA (LABS, IMAGING, TESTING) - I reviewed patient records, labs, notes, testing and imaging myself where available.  Lab Results  Component Value Date   WBC 11.3 (H) 03/20/2020   HGB 15.4 (H) 03/20/2020   HCT 47.6 (H) 03/20/2020   MCV 96.4 03/20/2020   PLT 245 03/20/2020      Component Value Date/Time   NA 139 03/20/2020 1021  K 4.4 03/20/2020 1021   CL 102 03/20/2020 1021   CO2 25 03/20/2020 1021   GLUCOSE 211 (H) 03/20/2020 1021   BUN 29 (H) 03/20/2020 1021   CREATININE 1.34 (H) 03/20/2020 1021   CALCIUM 8.9 03/20/2020 1021   PROT 6.2 (L) 03/20/2020 1021   ALBUMIN 3.2 (L) 03/20/2020 1021   AST 36 03/20/2020 1021   ALT 49 (H) 03/20/2020 1021   ALKPHOS 234 (H) 03/20/2020 1021   BILITOT 0.5 03/20/2020 1021   GFRNONAA 41 (L) 03/20/2020 1021   GFRAA 47 (L) 03/20/2020 1021   Lab Results  Component Value Date   CHOL 168 04/05/2019   HDL 55.10 04/05/2019   LDLCALC 85 04/05/2019   TRIG 140.0 04/05/2019   CHOLHDL 3 04/05/2019        ASSESSMENT AND PLAN  Common migraine without intractability  Spondylosis of cervical region without myelopathy or radiculopathy  Cervical facet joint syndrome  Numbness  Episodic tension-type headache, not intractable   1.   Continue imipramine 75 mg nightly and Keppra 500 mg bid for HA's (migraine and tension type).  . 2.    Renew Fioricet #16 with 5 refills Imitrex prn migraine.   3.    Stay active and exercise. 4.    RTC 6 months   Charlea Nardo A. Felecia Shelling, MD, [facility] 08/28/5619, 30:86 AM Certified in Neurology, Clinical Neurophysiology, Sleep Medicine, Pain Medicine and Neuroimaging  [facility] [address] [place], [place] [address] [phone]

## 2020-05-12 ENCOUNTER — Other Ambulatory Visit: Payer: Self-pay | Admitting: Internal Medicine

## 2020-08-19 ENCOUNTER — Other Ambulatory Visit: Payer: Self-pay | Admitting: Neurology

## 2020-09-17 ENCOUNTER — Other Ambulatory Visit: Payer: Self-pay | Admitting: Internal Medicine

## 2020-09-23 ENCOUNTER — Encounter: Payer: Self-pay | Admitting: Family Medicine

## 2020-09-23 ENCOUNTER — Ambulatory Visit (INDEPENDENT_AMBULATORY_CARE_PROVIDER_SITE_OTHER): Payer: Medicare Other | Admitting: Family Medicine

## 2020-09-23 VITALS — BP 115/82 | HR 115 | Ht 61.0 in | Wt 101.0 lb

## 2020-09-23 DIAGNOSIS — G44219 Episodic tension-type headache, not intractable: Secondary | ICD-10-CM | POA: Diagnosis not present

## 2020-09-23 DIAGNOSIS — R000 Tachycardia, unspecified: Secondary | ICD-10-CM

## 2020-09-23 DIAGNOSIS — G43009 Migraine without aura, not intractable, without status migrainosus: Secondary | ICD-10-CM

## 2020-09-23 DIAGNOSIS — M47812 Spondylosis without myelopathy or radiculopathy, cervical region: Secondary | ICD-10-CM | POA: Diagnosis not present

## 2020-09-23 MED ORDER — BUTALBITAL-APAP-CAFFEINE 50-325-40 MG PO TABS: ORAL_TABLET | ORAL | 5 refills | Status: DC

## 2020-09-23 MED ORDER — PROPRANOLOL HCL 20 MG PO TABS: 20.0000 mg | ORAL_TABLET | Freq: Two times a day (BID) | ORAL | 3 refills | Status: DC

## 2020-09-23 NOTE — Progress Notes (Signed)
I have read the note, and I agree with the clinical assessment and plan.  Sabryn Preslar A. Tierrah Anastos, MD, PhD, FAAN Certified in Neurology, Clinical Neurophysiology, Sleep Medicine, Pain Medicine and Neuroimaging  [facility] [address], [address] [place], [place] [address] [phone]  

## 2020-09-23 NOTE — Patient Instructions (Addendum)
Below is our plan:  We will continue impramine and levetiracetam. I will add propranolol 20mg  twice daily. Start with 1 tablet at night for 1 week then increase if tolerating well. May consider increasing dose in the future based on response. Continue sumatriptan and Fioricet as prescribed.   Please make sure you are staying well hydrated. I recommend 50-60 ounces daily. Well balanced diet and regular exercise encouraged. Consistent sleep schedule with 6-8 hours recommended.   Please continue follow up with care team as directed. Let your PCP know about elevated heart rate and that we are starting you on propranolol for help with this and headache management.   Follow up in 6 months   You may receive a survey regarding today's visit. I encourage you to leave honest feed back as I do use this information to improve patient care. Thank you for seeing me today!       Tension Headache, Adult A tension headache is a feeling of pain, pressure, or aching in the head. It is often felt over the front and sides of the head. Tension headaches can last from 30 minutes to several days. What are the causes? The cause of this condition is not known. Sometimes, tension headaches are brought on by stress, worry (anxiety), or depression. Other things that may set them off include:  Alcohol.  Too much caffeine or caffeine withdrawal.  Colds, flu, or sinus infections.  Dental problems. This can include clenching your teeth.  Being tired.  Holding your head and neck in the same position for a long time, such as while using a computer.  Smoking.  Arthritis in the neck. What are the signs or symptoms?  Feeling pressure around the head.  A dull ache in the head.  Pain over the front and sides of the head.  Feeling sore or tender in the muscles of the head, neck, and shoulders. How is this treated? This condition may be treated with lifestyle changes and with medicines that help relieve  symptoms. Follow these instructions at home: Managing pain  Take over-the-counter and prescription medicines only as told by your doctor.  When you have a headache, lie down in a dark, quiet room.  If told, put ice on your head and neck. To do this: ? Put ice in a plastic bag. ? Place a towel between your skin and the bag. ? Leave the ice on for 20 minutes, 2-3 times a day. ? Take off the ice if your skin turns bright red. This is very important. If you cannot feel pain, heat, or cold, you have a greater risk of damage to the area.  If told, put heat on the back of your neck. Do this as often as told by your doctor. Use the heat source that your doctor recommends, such as a moist heat pack or a heating pad. ? Place a towel between your skin and the heat source. ? Leave the heat on for 20-30 minutes. ? Take off the heat if your skin turns bright red. This is very important. If you cannot feel pain, heat, or cold, you have a greater risk of getting burned. Eating and drinking  Eat meals on a regular schedule.  If you drink alcohol: ? Limit how much you have to:  0-1 drink a day for women who are not pregnant.  0-2 drinks a day for men. ? Know how much alcohol is in your drink. In the [place] one drink equals one 12 oz bottle  of beer (355 mL), one 5 oz glass of wine (148 mL), or one 1 oz glass of hard liquor (44 mL).  Drink enough fluid to keep your pee (urine) pale yellow.  Do not use a lot of caffeine, or stop using caffeine. Lifestyle  Get 7-9 hours of sleep each night. Or get the amount of sleep that your doctor tells you to.  At bedtime, keep computers, phones, and tablets out of your room.  Find ways to lessen your stress. This may include: ? Exercise. ? Deep breathing. ? Yoga. ? Listening to music. ? Thinking positive thoughts.  Sit up straight. Try to relax your muscles.  Do not smoke or use any products that contain nicotine or tobacco. If you need help quitting,  ask your doctor. General instructions  Avoid things that can bring on headaches. Keep a headache journal to see what may bring on headaches. For example, write down: ? What you eat and drink. ? How much sleep you get. ? Any change to your diet or medicines.  Keep all follow-up visits.   Contact a doctor if:  Your headache does not get better.  Your headache comes back.  You have a headache, and sounds, light, or smells bother you.  You feel like you may vomit, or you vomit.  Your stomach hurts. Get help right away if:  You all of a sudden get a very bad headache with any of these things: ? A stiff neck. ? Feeling like you may vomit. ? Vomiting. ? Feeling mixed up (confused). ? Feeling weak in one part or one side of your body. ? Having trouble seeing or speaking, or both. ? Feeling short of breath. ? A rash. ? Feeling very sleepy. ? Pain in your eye or ear. ? Trouble walking or balancing. ? Feeling like you will faint, or you faint. Summary  A tension headache is pain, pressure, or aching in your head.  Tension headaches can last from 30 minutes to several days.  Lifestyle changes and medicines may help relieve pain. This information is not intended to replace advice given to you by your health care provider. Make sure you discuss any questions you have with your health care provider. Document Revised: 04/03/2020 Document Reviewed: 04/03/2020 Elsevier Patient Education  2021 [place].   Migraine Headache A migraine headache is a very strong throbbing pain on one side or both sides of your head. This type of headache can also cause other symptoms. It can last from 4 hours to 3 days. Talk with your doctor about what things may bring on (trigger) this condition. What are the causes? The exact cause of this condition is not known. This condition may be triggered or caused by:  Drinking alcohol.  Smoking.  Taking medicines, such as: ? Medicine used to treat  chest pain (nitroglycerin). ? Birth control pills. ? Estrogen. ? Some blood pressure medicines.  Eating or drinking certain products.  Doing physical activity. Other things that may trigger a migraine headache include:  Having a menstrual period.  Pregnancy.  Hunger.  Stress.  Not getting enough sleep or getting too much sleep.  Weather changes.  Tiredness (fatigue). What increases the risk?  Being 63-57 years old.  Being female.  Having a family history of migraine headaches.  Being Caucasian.  Having depression or anxiety.  Being very overweight. What are the signs or symptoms?  A throbbing pain. This pain may: ? Happen in any area of the head, such as on  one side or both sides. ? Make it hard to do daily activities. ? Get worse with physical activity. ? Get worse around bright lights or loud noises.  Other symptoms may include: ? Feeling sick to your stomach (nauseous). ? Vomiting. ? Dizziness. ? Being sensitive to bright lights, loud noises, or smells.  Before you get a migraine headache, you may get warning signs (an aura). An aura may include: ? Seeing flashing lights or having blind spots. ? Seeing bright spots, halos, or zigzag lines. ? Having tunnel vision or blurred vision. ? Having numbness or a tingling feeling. ? Having trouble talking. ? Having weak muscles.  Some people have symptoms after a migraine headache (postdromal phase), such as: ? Tiredness. ? Trouble thinking (concentrating). How is this treated?  Taking medicines that: ? Relieve pain. ? Relieve the feeling of being sick to your stomach. ? Prevent migraine headaches.  Treatment may also include: ? Having acupuncture. ? Avoiding foods that bring on migraine headaches. ? Learning ways to control your body functions (biofeedback). ? Therapy to help you know and deal with negative thoughts (cognitive behavioral therapy). Follow these instructions at home: Medicines  Take  over-the-counter and prescription medicines only as told by your doctor.  Ask your doctor if the medicine prescribed to you: ? Requires you to avoid driving or using heavy machinery. ? Can cause trouble pooping (constipation). You may need to take these steps to prevent or treat trouble pooping:  Drink enough fluid to keep your pee (urine) pale yellow.  Take over-the-counter or prescription medicines.  Eat foods that are high in fiber. These include beans, whole grains, and fresh fruits and vegetables.  Limit foods that are high in fat and sugar. These include fried or sweet foods. Lifestyle  Do not drink alcohol.  Do not use any products that contain nicotine or tobacco, such as cigarettes, e-cigarettes, and chewing tobacco. If you need help quitting, ask your doctor.  Get at least 8 hours of sleep every night.  Limit and deal with stress. General instructions  Keep a journal to find out what may bring on your migraine headaches. For example, write down: ? What you eat and drink. ? How much sleep you get. ? Any change in what you eat or drink. ? Any change in your medicines.  If you have a migraine headache: ? Avoid things that make your symptoms worse, such as bright lights. ? It may help to lie down in a dark, quiet room. ? Do not drive or use heavy machinery. ? Ask your doctor what activities are safe for you.  Keep all follow-up visits as told by your doctor. This is important.      Contact a doctor if:  You get a migraine headache that is different or worse than others you have had.  You have more than 15 headache days in one month. Get help right away if:  Your migraine headache gets very bad.  Your migraine headache lasts longer than 72 hours.  You have a fever.  You have a stiff neck.  You have trouble seeing.  Your muscles feel weak or like you cannot control them.  You start to lose your balance a lot.  You start to have trouble walking.  You  pass out (faint).  You have a seizure. Summary  A migraine headache is a very strong throbbing pain on one side or both sides of your head. These headaches can also cause other symptoms.  This condition  may be treated with medicines and changes to your lifestyle.  Keep a journal to find out what may bring on your migraine headaches.  Contact a doctor if you get a migraine headache that is different or worse than others you have had.  Contact your doctor if you have more than 15 headache days in a month. This information is not intended to replace advice given to you by your health care provider. Make sure you discuss any questions you have with your health care provider. Document Revised: 10/27/2018 Document Reviewed: 08/17/2018 Elsevier Patient Education  [place].

## 2020-09-23 NOTE — Progress Notes (Signed)
Chief Complaint  Patient presents with  . Follow-up    RM 1 alone Pt has more has tension in back of neck. Things are about the same      HISTORY OF PRESENT ILLNESS: 09/23/20 ALL:  Teresa Lozano is a 70 y.o. female here today for follow up for headaches. She continues imipramine 75mg  QHS and levetiracetam 500mg  BID for tension style and migraine prevention. She rarely takes Sumatriptan but it works for migraine abortion and Fioricet for tension headaches. She is having to take at least 3-4 doses every week. Medications work fairly well. She feels neck pain is stable. She has noted a higher heart rate for the past year. She denies chest pain or palpitations. No dizziness or lightheadedness. She does feel winded with working in her yard. She denies history of asthma. She does not drink much water but does like tea.    HISTORY (copied from Dr Garth Bigness previous note)  Teresa Lozano is a 70 y.o. woman who has had headaches for 40 years.    Update 03/26/2020: She notes migraine headaches once a month but tension headaches are more frequently -once a week.   Most headaches are occipital and radiate forward.    She continues on imipramine 75 mg nightly and Keppra 500 mg p.o. twice daily.    She tolerates the medications well.   For migraines she takes sumatriptan and usually has a benefit.  For occipitla headaches she takes fioricet or  OTC Excedrin, often with benefit.     She had kidney stones so can't use topiramate or zonisamide.   She notes shooting pain in her fingers and toes.   This is better than last year.    Hand numbness often involves the ring fingers.     The 4th and 5th toes are more involved than the rest of the foot.   She has pain left > right.   NCV/EMG was normal last year.   She does not note weakness in the hand hands or feet.  She has some neck pain and back pain.  DATA CT has shown severe left facet changes at [facility] in 2011 and much milder facet hypertrophy to the  right at [facility].   She has ACDF at [place] (Dr. Ronnald Ramp 2011)   REVIEW OF SYSTEMS: Out of a complete 14 system review of symptoms, the patient complains only of the following symptoms, headaches, neck pain, elevated heart rate and all other reviewed systems are negative.    ALLERGIES: Allergies  Allergen Reactions  . Robaxin [Methocarbamol] Nausea And Vomiting  . Lisinopril Cough     HOME MEDICATIONS: Outpatient Medications Prior to Visit  Medication Sig Dispense Refill  . Acetaminophen (TYLENOL ARTHRITIS PAIN PO) Take by mouth.    Marland Kitchen amLODipine (NORVASC) 10 MG tablet *LAST REFILL* TAKE 1 TABLET (10 MG TOTAL) BY MOUTH DAILY. MUST SCHEDULE PHYSICAL 90 tablet 0  . atorvastatin (LIPITOR) 20 MG tablet Take 1 tablet (20 mg total) by mouth daily. 90 tablet 0  . CALCIUM PO Take 800 mg by mouth daily.    . cetirizine (ZYRTEC) 10 MG tablet Take 10 mg by mouth daily.    Marland Kitchen CRANBERRY PO Take 2 capsules by mouth daily.    Marland Kitchen imipramine (TOFRANIL) 25 MG tablet Take 3 tablets (75 mg total) by mouth at bedtime. 270 tablet 3  . levETIRAcetam (KEPPRA) 500 MG tablet Take 1 tablet (500 mg total) by mouth 2 (two) times daily. 180 tablet 3  .  Magnesium 500 MG CAPS Take 500 mg by mouth 2 (two) times a week.     . Multiple Vitamin (MULTI-VITAMIN DAILY PO) Take by mouth.    Marland Kitchen omeprazole (PRILOSEC) 40 MG capsule Take 1 capsule (40 mg total) by mouth daily. 60 capsule 5  . ondansetron (ZOFRAN) 4 MG tablet TAKE 1 TABLET BY MOUTH EVERY 8 HOURS AS NEEDED 20 tablet 5  . polyethylene glycol (MIRALAX / GLYCOLAX) packet Take 17 g by mouth daily.    . SUMAtriptan (IMITREX) 100 MG tablet TAKE 1 TAB AS NEEDED FOR MIGRAINE. MAY REPEAT IN 2 HRS IF HEADACHE PERSISTS OR RECURS. DNF 6/19 10 tablet 11  . VITAMIN D PO Take 5,000 Units by mouth daily.    . butalbital-acetaminophen-caffeine (FIORICET) 50-325-40 MG tablet One to two po prn headache.   No mor ethan 4/week 16 tablet 5  . tamsulosin (FLOMAX) 0.4 MG CAPS capsule TAKE 1  CAPSULE (0.4 MG TOTAL) BY MOUTH DAILY AS NEEDED (KIDNEY STONE). 15 capsule 1   Facility-Administered Medications Prior to Visit  Medication Dose Route Frequency Provider Last Rate Last Admin  . 0.9 %  sodium chloride infusion  500 mL Intravenous Once Armbruster, Carlota Raspberry, MD         PAST MEDICAL HISTORY: Past Medical History:  Diagnosis Date  . Allergy   . Arthritis   . Chicken pox   . Frequent headaches   . GERD (gastroesophageal reflux disease)   . History of kidney stones   . Hyperlipidemia   . Hypertension   . Osteoporosis      PAST SURGICAL HISTORY: Past Surgical History:  Procedure Laterality Date  . CERVICAL FUSION  2000  . COLONOSCOPY    . ESOPHAGOGASTRODUODENOSCOPY  11/2016  . ESOPHAGOGASTRODUODENOSCOPY (EGD) WITH PROPOFOL N/A 12/28/2016   Procedure: ESOPHAGOGASTRODUODENOSCOPY (EGD) WITH PROPOFOL;  Surgeon: Manus Gunning, MD;  Location: [facility];  Service: Gastroenterology;  Laterality: N/A;  . EYE SURGERY    . STOMACH SURGERY  04/2017   REMOVED ULCER  . TONSILLECTOMY    . UPPER GASTROINTESTINAL ENDOSCOPY       FAMILY HISTORY: Family History  Problem Relation Age of Onset  . Hyperlipidemia Mother   . Hypertension Mother   . Prostate cancer Father   . Hyperlipidemia Father   . Heart disease Father   . Heart disease Brother   . Hypertension Sister   . Esophageal cancer Sister   . Hypertension Maternal Grandmother   . Colon cancer Neg Hx   . Colon polyps Neg Hx   . Stomach cancer Neg Hx   . Rectal cancer Neg Hx      SOCIAL HISTORY: Social History   Socioeconomic History  . Marital status: Widowed    Spouse name: Not on file  . Number of children: Not on file  . Years of education: Not on file  . Highest education level: Not on file  Occupational History  . Not on file  Tobacco Use  . Smoking status: Never Smoker  . Smokeless tobacco: Never Used  Vaping Use  . Vaping Use: Never used  Substance and Sexual Activity  . Alcohol  use: No    Alcohol/week: 0.0 standard drinks  . Drug use: No  . Sexual activity: Never  Other Topics Concern  . Not on file  Social History Narrative  . Not on file   Social Determinants of Health   Financial Resource Strain: Not on file  Food Insecurity: Not on file  Transportation Needs: Not  on file  Physical Activity: Not on file  Stress: Not on file  Social Connections: Not on file  Intimate Partner Violence: Not on file      PHYSICAL EXAM  Vitals:   09/23/20 0911  BP: 115/82  Pulse: (!) 115  Weight: 101 lb (45.8 kg)  Height: 5\' 1"  (1.549 m)   Body mass index is 19.08 kg/m.   Generalized: Well developed, in no acute distress  Cardiology: normal rate and rhythm, no murmur auscultated  Respiratory: clear to auscultation bilaterally    Neurological examination  Mentation: Alert oriented to time, place, history taking. Follows all commands speech and language fluent Cranial nerve II-XII: Pupils were equal round reactive to light. Extraocular movements were full, visual field were full on confrontational test. Facial sensation and strength were normal. Uvula tongue midline. Head turning and shoulder shrug  were normal and symmetric. Motor: The motor testing reveals 5 over 5 strength of all 4 extremities. Good symmetric motor tone is noted throughout.  Sensory: Sensory testing is intact to soft touch on all 4 extremities. No evidence of extinction is noted.  Coordination: Cerebellar testing reveals good finger-nose-finger and heel-to-shin bilaterally.  Gait and station: Gait is normal. Tandem gait is normal. Romberg is negative. No drift is seen.  Reflexes: Deep tendon reflexes are symmetric and normal bilaterally.     DIAGNOSTIC DATA (LABS, IMAGING, TESTING) - I reviewed patient records, labs, notes, testing and imaging myself where available.  Lab Results  Component Value Date   WBC 11.3 (H) 03/20/2020   HGB 15.4 (H) 03/20/2020   HCT 47.6 (H) 03/20/2020    MCV 96.4 03/20/2020   PLT 245 03/20/2020      Component Value Date/Time   NA 139 03/20/2020 1021   K 4.4 03/20/2020 1021   CL 102 03/20/2020 1021   CO2 25 03/20/2020 1021   GLUCOSE 211 (H) 03/20/2020 1021   BUN 29 (H) 03/20/2020 1021   CREATININE 1.34 (H) 03/20/2020 1021   CALCIUM 8.9 03/20/2020 1021   PROT 6.2 (L) 03/20/2020 1021   ALBUMIN 3.2 (L) 03/20/2020 1021   AST 36 03/20/2020 1021   ALT 49 (H) 03/20/2020 1021   ALKPHOS 234 (H) 03/20/2020 1021   BILITOT 0.5 03/20/2020 1021   GFRNONAA 41 (L) 03/20/2020 1021   GFRAA 47 (L) 03/20/2020 1021   Lab Results  Component Value Date   CHOL 168 04/05/2019   HDL 55.10 04/05/2019   LDLCALC 85 04/05/2019   TRIG 140.0 04/05/2019   CHOLHDL 3 04/05/2019   Lab Results  Component Value Date   HGBA1C 5.2 12/24/2019   No results found for: VITAMINB12 No results found for: TSH  No flowsheet data found.   No flowsheet data found.   ASSESSMENT AND PLAN  70 y.o. year old female  has a past medical history of Allergy, Arthritis, Chicken pox, Frequent headaches, GERD (gastroesophageal reflux disease), History of kidney stones, Hyperlipidemia, Hypertension, and Osteoporosis. here with   Common migraine without intractability  Episodic tension-type headache, not intractable  Cervical facet joint syndrome  Tachycardia  She is doing well, today. Headaches are fairly well managed but she does continue to have regular tension style headaches. She has also noted an increased heart rate for the past year. Pulse today 116, usually around 80-90 at rest. She has taken propranolol in the past for headaches and reports tolerating it well. BP is usually 115-120/80's. We will add propranolol 20mg  twice daily for tension headache prevention and may also help with  elevated heart rate. She was encouraged to monitor closely for side effects and let me know if she has any concerns. She will take daily at bedtime for a week then increase to twice daily  dosing. She has a CPE and eye exam scheduled this month and will discuss concerns of heart rate with PCP. Red flag warnings discussed. She will focus on increasing water intake. Healthy lifestyle habits encouraged. She will continue sumatriptan and Fioricet as prescribed. She will follow up in 6 months, sooner if needed.   No orders of the defined types were placed in this encounter.    Meds ordered this encounter  Medications  . propranolol (INDERAL) 20 MG tablet    Sig: Take 1 tablet (20 mg total) by mouth 2 (two) times daily.    Dispense:  180 tablet    Refill:  3    Order Specific Question:   Supervising Provider    Answer:   Melvenia Beam [id number]  . butalbital-acetaminophen-caffeine (FIORICET) 50-325-40 MG tablet    Sig: One to two po prn headache.   No mor ethan 4/week    Dispense:  16 tablet    Refill:  5    #16/30 days    Order Specific Question:   Supervising Provider    Answer:   Melvenia Beam [id number]      I spent 20 minutes of face-to-face and non-face-to-face time with patient.  This included previsit chart review, lab review, study review, order entry, electronic health record documentation, patient education.    Debbora Presto, MSN, FNP-C 09/23/2020, 9:53 AM  [facility] [address], [place], [place] [address] [phone]

## 2020-10-20 DIAGNOSIS — I10 Essential (primary) hypertension: Secondary | ICD-10-CM | POA: Diagnosis not present

## 2020-10-20 DIAGNOSIS — H524 Presbyopia: Secondary | ICD-10-CM | POA: Diagnosis not present

## 2020-10-20 DIAGNOSIS — H5213 Myopia, bilateral: Secondary | ICD-10-CM | POA: Diagnosis not present

## 2020-10-20 DIAGNOSIS — H35033 Hypertensive retinopathy, bilateral: Secondary | ICD-10-CM | POA: Diagnosis not present

## 2020-10-20 DIAGNOSIS — H52223 Regular astigmatism, bilateral: Secondary | ICD-10-CM | POA: Diagnosis not present

## 2020-10-20 DIAGNOSIS — H2513 Age-related nuclear cataract, bilateral: Secondary | ICD-10-CM | POA: Diagnosis not present

## 2020-11-12 ENCOUNTER — Ambulatory Visit: Payer: Medicare Other | Admitting: Internal Medicine

## 2020-12-21 ENCOUNTER — Other Ambulatory Visit: Payer: Self-pay | Admitting: Internal Medicine

## 2020-12-24 NOTE — Telephone Encounter (Signed)
Refill request Amlodipine Last office visit 12/24/19 No upcoming appointment scheduled Last refill 09/17/20 #90

## 2021-02-18 DIAGNOSIS — N1831 Chronic kidney disease, stage 3a: Secondary | ICD-10-CM | POA: Diagnosis not present

## 2021-02-18 DIAGNOSIS — R809 Proteinuria, unspecified: Secondary | ICD-10-CM | POA: Diagnosis not present

## 2021-02-18 DIAGNOSIS — I10 Essential (primary) hypertension: Secondary | ICD-10-CM | POA: Diagnosis not present

## 2021-02-18 DIAGNOSIS — N3090 Cystitis, unspecified without hematuria: Secondary | ICD-10-CM | POA: Diagnosis not present

## 2021-03-04 DIAGNOSIS — N182 Chronic kidney disease, stage 2 (mild): Secondary | ICD-10-CM | POA: Diagnosis not present

## 2021-03-04 DIAGNOSIS — I10 Essential (primary) hypertension: Secondary | ICD-10-CM | POA: Diagnosis not present

## 2021-03-19 ENCOUNTER — Other Ambulatory Visit: Payer: Self-pay | Admitting: Neurology

## 2021-03-19 DIAGNOSIS — R1013 Epigastric pain: Secondary | ICD-10-CM

## 2021-03-24 ENCOUNTER — Other Ambulatory Visit: Payer: Self-pay

## 2021-03-24 MED ORDER — BUTALBITAL-APAP-CAFFEINE 50-325-40 MG PO TABS: ORAL_TABLET | ORAL | 5 refills | Status: DC

## 2021-03-24 NOTE — Telephone Encounter (Signed)
Reorder for [employer]

## 2021-03-24 NOTE — Telephone Encounter (Signed)
Last refilled 09/23/2020 for #16 with 5 refills

## 2021-03-24 NOTE — Addendum Note (Signed)
Addended by: Georgiann Cocker on: 03/24/2021 01:30 PM   Modules accepted: Orders

## 2021-03-25 NOTE — Patient Instructions (Signed)
Below is our plan:  We will continue imipramine '75mg'$  daily and levetiracetam '500mg'$  twice daily. I will add Emgality injections every 30 days. First two injections were given in the office and you will continue 1 per month at home. Use butalbital and sumatriptan sparingly. Please try to wean butalbital due to concerns for rebound headaches.   Please make sure you are staying well hydrated. I recommend 50-60 ounces daily. Well balanced diet and regular exercise encouraged. Consistent sleep schedule with 6-8 hours recommended.   Please continue follow up with care team as directed.   Follow up with me in 6 months   You may receive a survey regarding today's visit. I encourage you to leave honest feed back as I do use this information to improve patient care. Thank you for seeing me today!

## 2021-03-25 NOTE — Progress Notes (Addendum)
Chief Complaint  Patient presents with   Follow-up    Rm 6, alone. Here for migraine f/u, pt reports no change since las OV. Weather can trigger HA.     HISTORY OF PRESENT ILLNESS:  03/26/21 ALL:  Lysa returns for follow up for headaches. She added propranolol '20mg'$  BID at last visit and continued imipramine '75mg'$  QHS and levetiracetam '500mg'$  BID. Sumatriptan and butalbital help with abortive therapy. She has about 8-12 headache days with 4-5 migraines per month. 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 She usually takes 4-5 sumatriptan and 16 butalbital every month.   Tried and failed: topiramate (contraindicated for kidney stones), propranolol (edema), amitriptyline (ineffective), imipramine (on now), levetiracetam (on now)  09/23/2020 ALL:  GABRYELLA ASLAN is a 70 y.o. female here today for follow up for headaches. She continues imipramine '75mg'$  QHS and levetiracetam '500mg'$  BID for tension style and migraine prevention. She rarely takes Sumatriptan but it works for migraine abortion and Fioricet for tension headaches. She is having to take at least 3-4 doses every week. Medications work fairly well. She feels neck pain is stable. She has noted a higher heart rate for the past year. She denies chest pain or palpitations. No dizziness or lightheadedness. She does feel winded with working in her yard. She denies history of asthma. She does not drink much water but does like tea.    HISTORY (copied from Dr Garth Bigness previous note)  Sheza Westrich is a 70 y.o. woman who has had headaches for 40 years.     Update 03/26/2020: She notes migraine headaches once a month but tension headaches are more frequently -once a week.   Most headaches are occipital and radiate forward.    She continues on imipramine 75 mg nightly and Keppra 500 mg p.o. twice daily.    She tolerates the medications well.   For migraines she takes  sumatriptan and usually has a benefit.  For occipitla headaches she takes fioricet or  OTC Excedrin, often with benefit.     She had kidney stones so can't use topiramate or zonisamide.    She notes shooting pain in her fingers and toes.   This is better than last year.    Hand numbness often involves the ring fingers.     The 4th and 5th toes are more involved than the rest of the foot.   She has pain left > right.   NCV/EMG was normal last year.   She does not note weakness in the hand hands or feet.  She has some neck pain and back pain.   DATA CT has shown severe left facet changes at [facility] in 2011 and much milder facet hypertrophy to the right at [facility]. She has ACDF at [place] (Dr. Ronnald Ramp 2011)   REVIEW OF SYSTEMS: Out of a complete 14 system review of symptoms, the patient complains only of the following symptoms, headaches, neck pain, elevated heart rate and all other reviewed systems are negative.   ALLERGIES: Allergies  Allergen Reactions   Robaxin [Methocarbamol] Nausea And Vomiting   Lisinopril Cough     HOME MEDICATIONS: Outpatient Medications Prior to Visit  Medication Sig Dispense Refill   Acetaminophen (TYLENOL ARTHRITIS PAIN PO) Take by mouth.     amLODipine (NORVASC) 10 MG tablet TAKE 1 TABLET BY MOUTH EVERY DAY 90 tablet 0   atorvastatin (LIPITOR) 20 MG tablet Take 1 tablet (20 mg  total) by mouth daily. 90 tablet 0   butalbital-acetaminophen-caffeine (FIORICET) 50-325-40 MG tablet One to two tablets by mouth as needed for headache. No more than 4/week 16 tablet 5   CALCIUM PO Take 800 mg by mouth daily.     cetirizine (ZYRTEC) 10 MG tablet Take 10 mg by mouth daily.     CRANBERRY PO Take 2 capsules by mouth daily.     imipramine (TOFRANIL) 25 MG tablet TAKE 3 TABLETS (75 MG TOTAL) BY MOUTH AT BEDTIME. 270 tablet 3   levETIRAcetam (KEPPRA) 500 MG tablet Take 1 tablet (500 mg total) by mouth 2 (two) times daily. 180 tablet 3   Magnesium 500 MG CAPS Take 500 mg by mouth 2  (two) times a week.      Multiple Vitamin (MULTI-VITAMIN DAILY PO) Take by mouth.     omeprazole (PRILOSEC) 40 MG capsule Take 1 capsule (40 mg total) by mouth daily. 60 capsule 5   ondansetron (ZOFRAN) 4 MG tablet TAKE 1 TABLET BY MOUTH EVERY 8 HOURS AS NEEDED 20 tablet 5   polyethylene glycol (MIRALAX / GLYCOLAX) packet Take 17 g by mouth daily.     SUMAtriptan (IMITREX) 100 MG tablet TAKE 1 TAB AS NEEDED FOR MIGRAINE. MAY REPEAT IN 2 HRS IF HEADACHE PERSISTS OR RECURS. DNF 6/19 10 tablet 11   VITAMIN D PO Take 5,000 Units by mouth daily.     propranolol (INDERAL) 20 MG tablet Take 1 tablet (20 mg total) by mouth 2 (two) times daily. 180 tablet 3   Facility-Administered Medications Prior to Visit  Medication Dose Route Frequency Provider Last Rate Last Admin   0.9 %  sodium chloride infusion  500 mL Intravenous Once Armbruster, Carlota Raspberry, MD         PAST MEDICAL HISTORY: Past Medical History:  Diagnosis Date   Allergy    Arthritis    Chicken pox    Frequent headaches    GERD (gastroesophageal reflux disease)    History of kidney stones    Hyperlipidemia    Hypertension    Osteoporosis      PAST SURGICAL HISTORY: Past Surgical History:  Procedure Laterality Date   CERVICAL FUSION  2000   COLONOSCOPY     ESOPHAGOGASTRODUODENOSCOPY  11/2016   ESOPHAGOGASTRODUODENOSCOPY (EGD) WITH PROPOFOL N/A 12/28/2016   Procedure: ESOPHAGOGASTRODUODENOSCOPY (EGD) WITH PROPOFOL;  Surgeon: Manus Gunning, MD;  Location: [facility];  Service: Gastroenterology;  Laterality: N/A;   EYE SURGERY     STOMACH SURGERY  04/2017   REMOVED ULCER   TONSILLECTOMY     UPPER GASTROINTESTINAL ENDOSCOPY       FAMILY HISTORY: Family History  Problem Relation Age of Onset   Hyperlipidemia Mother    Hypertension Mother    Prostate cancer Father    Hyperlipidemia Father    Heart disease Father    Heart disease Brother    Hypertension Sister    Esophageal cancer Sister    Hypertension  Maternal Grandmother    Colon cancer Neg Hx    Colon polyps Neg Hx    Stomach cancer Neg Hx    Rectal cancer Neg Hx      SOCIAL HISTORY: Social History   Socioeconomic History   Marital status: Widowed    Spouse name: Not on file   Number of children: Not on file   Years of education: Not on file   Highest education level: Not on file  Occupational History   Not on file  Tobacco Use  Smoking status: Never   Smokeless tobacco: Never  Vaping Use   Vaping Use: Never used  Substance and Sexual Activity   Alcohol use: No    Alcohol/week: 0.0 standard drinks   Drug use: No   Sexual activity: Never  Other Topics Concern   Not on file  Social History Narrative   Not on file   Social Determinants of Health   Financial Resource Strain: Not on file  Food Insecurity: Not on file  Transportation Needs: Not on file  Physical Activity: Not on file  Stress: Not on file  Social Connections: Not on file  Intimate Partner Violence: Not on file      PHYSICAL EXAM  Vitals:   03/26/21 0936  BP: 130/80  Pulse: 80  Weight: 98 lb 3.2 oz (44.5 kg)  Height: '5\' 2"'$  (1.575 m)    Body mass index is 17.96 kg/m.   Generalized: Well developed, in no acute distress  Cardiology: normal rate and rhythm, no murmur auscultated  Respiratory: clear to auscultation bilaterally    Neurological examination  Mentation: Alert oriented to time, place, history taking. Follows all commands speech and language fluent Cranial nerve II-XII: Pupils were equal round reactive to light. Extraocular movements were full, visual field were full on confrontational test. Facial sensation and strength were normal. Head turning and shoulder shrug  were normal and symmetric. Motor: The motor testing reveals 5 over 5 strength of all 4 extremities. Good symmetric motor tone is noted throughout.  Gait and station: Gait is normal.     DIAGNOSTIC DATA (LABS, IMAGING, TESTING) - I reviewed patient records,  labs, notes, testing and imaging myself where available.  Lab Results  Component Value Date   WBC 11.3 (H) 03/20/2020   HGB 15.4 (H) 03/20/2020   HCT 47.6 (H) 03/20/2020   MCV 96.4 03/20/2020   PLT 245 03/20/2020      Component Value Date/Time   NA 139 03/20/2020 1021   K 4.4 03/20/2020 1021   CL 102 03/20/2020 1021   CO2 25 03/20/2020 1021   GLUCOSE 211 (H) 03/20/2020 1021   BUN 29 (H) 03/20/2020 1021   CREATININE 1.34 (H) 03/20/2020 1021   CALCIUM 8.9 03/20/2020 1021   PROT 6.2 (L) 03/20/2020 1021   ALBUMIN 3.2 (L) 03/20/2020 1021   AST 36 03/20/2020 1021   ALT 49 (H) 03/20/2020 1021   ALKPHOS 234 (H) 03/20/2020 1021   BILITOT 0.5 03/20/2020 1021   GFRNONAA 41 (L) 03/20/2020 1021   GFRAA 47 (L) 03/20/2020 1021   Lab Results  Component Value Date   CHOL 168 04/05/2019   HDL 55.10 04/05/2019   LDLCALC 85 04/05/2019   TRIG 140.0 04/05/2019   CHOLHDL 3 04/05/2019   Lab Results  Component Value Date   HGBA1C 5.2 12/24/2019   No results found for: VITAMINB12 No results found for: TSH  No flowsheet data found.   No flowsheet data found.   ASSESSMENT AND PLAN  70 y.o. year old female  has a past medical history of Allergy, Arthritis, Chicken pox, Frequent headaches, GERD (gastroesophageal reflux disease), History of kidney stones, Hyperlipidemia, Hypertension, and Osteoporosis. here with   Common migraine without intractability  Episodic tension-type headache, not intractable  She is doing well, today. Headaches are fairly well managed but she does continue to have regular tension style headaches and migraines. She will continue imipramine '75mg'$  dail and levetiracetam '500mg'$  BID. I will add Emgality injections every 30 days. Loading dose administered in office. She  was educated on possible side effects, appropriate storage and timing of effectiveness. Healthy lifestyle habits encouraged. She will continue sumatriptan and Fioricet as prescribed. She was advised to try  to wean butalbital as tolerated. She will follow up in 6 months, sooner if needed.   No orders of the defined types were placed in this encounter.   No orders of the defined types were placed in this encounter.   Debbora Presto, MSN, FNP-C 03/26/2021, 9:42 AM  [facility] [address], [place], [place] [address] [phone]  I have read the note, and I agree with the clinical assessment and plan.  Richard A. Felecia Shelling, MD, PhD, [facility] Certified in Neurology, Clinical Neurophysiology, Sleep Medicine, Pain Medicine and Neuroimaging  [facility] [address] [place],  [address] [phone]

## 2021-03-26 ENCOUNTER — Other Ambulatory Visit: Payer: Self-pay

## 2021-03-26 ENCOUNTER — Other Ambulatory Visit: Payer: Self-pay | Admitting: Family

## 2021-03-26 ENCOUNTER — Telehealth: Payer: Self-pay

## 2021-03-26 ENCOUNTER — Ambulatory Visit (INDEPENDENT_AMBULATORY_CARE_PROVIDER_SITE_OTHER): Payer: Medicare Other | Admitting: Family Medicine

## 2021-03-26 ENCOUNTER — Encounter: Payer: Self-pay | Admitting: Family Medicine

## 2021-03-26 VITALS — BP 130/80 | HR 80 | Ht 62.0 in | Wt 98.2 lb

## 2021-03-26 DIAGNOSIS — G43009 Migraine without aura, not intractable, without status migrainosus: Secondary | ICD-10-CM | POA: Diagnosis not present

## 2021-03-26 DIAGNOSIS — G44219 Episodic tension-type headache, not intractable: Secondary | ICD-10-CM | POA: Diagnosis not present

## 2021-03-26 MED ORDER — EMGALITY 120 MG/ML ~~LOC~~ SOAJ: 120.0000 mg | SUBCUTANEOUS | 11 refills | Status: DC

## 2021-03-26 MED ORDER — SUMATRIPTAN SUCCINATE 100 MG PO TABS: ORAL_TABLET | ORAL | 11 refills | Status: DC

## 2021-03-26 MED ORDER — LEVETIRACETAM 500 MG PO TABS: 500.0000 mg | ORAL_TABLET | Freq: Two times a day (BID) | ORAL | 3 refills | Status: DC

## 2021-03-26 MED ORDER — EMGALITY 120 MG/ML ~~LOC~~ SOAJ: 120.0000 mg | SUBCUTANEOUS | 0 refills | Status: DC

## 2021-03-26 NOTE — Telephone Encounter (Signed)
I submitted a PA request on CMM, Key: [id number] - PA Case ID: [id number]  Received instant approval.  PA Case: [id number], Status: Approved, Coverage Starts on: 03/26/2021 12:00:00 AM, Coverage Ends on: 06/24/2021 12:00:00 AM.

## 2021-06-23 ENCOUNTER — Telehealth: Payer: Self-pay | Admitting: *Deleted

## 2021-06-23 NOTE — Telephone Encounter (Signed)
Submitted PA Emgality on CMM. Key: [id number]. Received instant approval: "PA Case: [id number], Status: Approved, Coverage Starts on: 07/19/2020 12:00:00 AM, Coverage Ends on: 07/18/2022 12:00:00 AM. Questions? Contact [phone]."

## 2021-07-24 ENCOUNTER — Emergency Department (HOSPITAL_COMMUNITY): Payer: Medicare Other

## 2021-07-24 ENCOUNTER — Emergency Department (HOSPITAL_COMMUNITY)
Admission: EM | Admit: 2021-07-24 | Discharge: 2021-07-24 | Disposition: A | Discharge status: Home / Self Care | Payer: Medicare Other | Attending: Emergency Medicine | Admitting: Emergency Medicine

## 2021-07-24 ENCOUNTER — Other Ambulatory Visit: Payer: Self-pay

## 2021-07-24 DIAGNOSIS — K5900 Constipation, unspecified: Secondary | ICD-10-CM | POA: Diagnosis not present

## 2021-07-24 DIAGNOSIS — N23 Unspecified renal colic: Secondary | ICD-10-CM | POA: Insufficient documentation

## 2021-07-24 DIAGNOSIS — N281 Cyst of kidney, acquired: Secondary | ICD-10-CM | POA: Diagnosis not present

## 2021-07-24 DIAGNOSIS — N189 Chronic kidney disease, unspecified: Secondary | ICD-10-CM | POA: Diagnosis not present

## 2021-07-24 DIAGNOSIS — R109 Unspecified abdominal pain: Secondary | ICD-10-CM | POA: Diagnosis present

## 2021-07-24 DIAGNOSIS — I700 Atherosclerosis of aorta: Secondary | ICD-10-CM | POA: Diagnosis not present

## 2021-07-24 DIAGNOSIS — Z903 Acquired absence of stomach [part of]: Secondary | ICD-10-CM | POA: Diagnosis not present

## 2021-07-24 DIAGNOSIS — N132 Hydronephrosis with renal and ureteral calculous obstruction: Secondary | ICD-10-CM | POA: Diagnosis not present

## 2021-07-24 LAB — CBC WITH DIFFERENTIAL/PLATELET
Abs Immature Granulocytes: 0.09 10*3/uL — ABNORMAL HIGH (ref 0.00–0.07)
Basophils Absolute: 0 10*3/uL (ref 0.0–0.1)
Basophils Relative: 0 %
Eosinophils Absolute: 0 10*3/uL (ref 0.0–0.5)
Eosinophils Relative: 0 %
HCT: 38.1 % (ref 36.0–46.0)
Hemoglobin: 12 g/dL (ref 12.0–15.0)
Immature Granulocytes: 1 %
Lymphocytes Relative: 5 %
Lymphs Abs: 0.7 10*3/uL (ref 0.7–4.0)
MCH: 29.9 pg (ref 26.0–34.0)
MCHC: 31.5 g/dL (ref 30.0–36.0)
MCV: 94.8 fL (ref 80.0–100.0)
Monocytes Absolute: 0.3 10*3/uL (ref 0.1–1.0)
Monocytes Relative: 3 %
Neutro Abs: 12.6 10*3/uL — ABNORMAL HIGH (ref 1.7–7.7)
Neutrophils Relative %: 91 %
Platelets: 359 10*3/uL (ref 150–400)
RBC: 4.02 MIL/uL (ref 3.87–5.11)
RDW: 13.7 % (ref 11.5–15.5)
WBC: 13.8 10*3/uL — ABNORMAL HIGH (ref 4.0–10.5)
nRBC: 0 % (ref 0.0–0.2)

## 2021-07-24 LAB — URINALYSIS, ROUTINE W REFLEX MICROSCOPIC
Bilirubin Urine: NEGATIVE
Glucose, UA: NEGATIVE mg/dL
Ketones, ur: NEGATIVE mg/dL
Leukocytes,Ua: NEGATIVE
Nitrite: NEGATIVE
Protein, ur: NEGATIVE mg/dL
Specific Gravity, Urine: >1.03 — ABNORMAL HIGH (ref 1.005–1.030)
pH: 5.5 (ref 5.0–8.0)

## 2021-07-24 LAB — LIPASE, BLOOD: Lipase: 25 U/L (ref 11–51)

## 2021-07-24 LAB — URINALYSIS, MICROSCOPIC (REFLEX)
Bacteria, UA: NONE SEEN
RBC / HPF: >50 RBC/hpf (ref 0–5)

## 2021-07-24 LAB — COMPREHENSIVE METABOLIC PANEL
ALT: 22 U/L (ref 0–44)
AST: 29 U/L (ref 15–41)
Albumin: 3.4 g/dL — ABNORMAL LOW (ref 3.5–5.0)
Alkaline Phosphatase: 99 U/L (ref 38–126)
Anion gap: 10 (ref 5–15)
BUN: 17 mg/dL (ref 8–23)
CO2: 25 mmol/L (ref 22–32)
Calcium: 8.8 mg/dL — ABNORMAL LOW (ref 8.9–10.3)
Chloride: 106 mmol/L (ref 98–111)
Creatinine, Ser: 1.1 mg/dL — ABNORMAL HIGH (ref 0.44–1.00)
GFR, Estimated: 54 mL/min — ABNORMAL LOW (ref >60)
Glucose, Bld: 166 mg/dL — ABNORMAL HIGH (ref 70–99)
Potassium: 4.3 mmol/L (ref 3.5–5.1)
Sodium: 141 mmol/L (ref 135–145)
Total Bilirubin: 0.5 mg/dL (ref 0.3–1.2)
Total Protein: 5.9 g/dL — ABNORMAL LOW (ref 6.5–8.1)

## 2021-07-24 MED ORDER — OXYCODONE-ACETAMINOPHEN 5-325 MG PO TABS
1.0000 | ORAL_TABLET | Freq: Once | ORAL | Status: AC
Start: 2021-07-24 — End: 2021-07-24
  Administered 2021-07-24: 1 via ORAL
  Filled 2021-07-24: qty 1

## 2021-07-24 MED ORDER — ONDANSETRON 4 MG PO TBDP
4.0000 mg | ORAL_TABLET | Freq: Once | ORAL | Status: AC
Start: 2021-07-24 — End: 2021-07-24
  Administered 2021-07-24: 4 mg via ORAL
  Filled 2021-07-24: qty 1

## 2021-07-24 MED ORDER — ONDANSETRON 4 MG PO TBDP
4.0000 mg | ORAL_TABLET | Freq: Once | ORAL | Status: AC
  Administered 2021-07-24: 4 mg via ORAL
  Filled 2021-07-24: qty 1

## 2021-07-24 MED ORDER — OXYCODONE-ACETAMINOPHEN 5-325 MG PO TABS
1.0000 | ORAL_TABLET | Freq: Once | ORAL | Status: AC
  Administered 2021-07-24: 1 via ORAL
  Filled 2021-07-24: qty 1

## 2021-07-24 MED ORDER — OXYCODONE-ACETAMINOPHEN 5-325 MG PO TABS: 1.0000 | ORAL_TABLET | Freq: Four times a day (QID) | ORAL | 0 refills | Status: DC | PRN

## 2021-07-24 MED ORDER — ONDANSETRON 4 MG PO TBDP: 4.0000 mg | ORAL_TABLET | Freq: Three times a day (TID) | ORAL | 0 refills | Status: DC | PRN

## 2021-07-24 MED ORDER — TAMSULOSIN HCL 0.4 MG PO CAPS: 0.4000 mg | ORAL_CAPSULE | Freq: Every day | ORAL | 0 refills | Status: DC

## 2021-07-24 MED ORDER — KETOROLAC TROMETHAMINE 30 MG/ML IJ SOLN
30.0000 mg | Freq: Once | INTRAMUSCULAR | Status: AC
  Administered 2021-07-24: 30 mg via INTRAMUSCULAR
  Filled 2021-07-24: qty 1

## 2021-07-24 NOTE — Discharge Instructions (Addendum)
The pain medication will make the constipation worse, so make sure you start taking the Miralax.

## 2021-07-24 NOTE — ED Provider Notes (Signed)
[facility] EMERGENCY DEPARTMENT Provider Note   CSN: [id number] Arrival date & time: 07/24/21  0410     History  Chief Complaint  Patient presents with   Flank Pain    Teresa Lozano is a 71 y.o. female.  Pt is a 71 yo WF with a hx of kidney stones, CKD, and migraines.  Teresa Lozano presents to the ED today with left sided flank pain and nausea.  Teresa Lozano said this pain feels like her prior kidney stone pain.  Teresa Lozano has not had a stone in several years.  Teresa Lozano received percocet and zofran while in triage and her pain and nausea have improved.        Home Medications Prior to Admission medications   Medication Sig Start Date End Date Taking? Authorizing Provider  amLODipine (NORVASC) 10 MG tablet TAKE 1 TABLET BY MOUTH EVERY DAY Patient taking differently: Take 10 mg by mouth daily. 12/24/20  Yes Dutch Quint B, FNP  butalbital-acetaminophen-caffeine (FIORICET) [id number] MG tablet One to two tablets by mouth as needed for headache. No more than 4/week Patient taking differently: Take 1-2 tablets by mouth every 6 (six) hours as needed for headache or migraine. 03/24/21  Yes Lomax, Amy, NP  cetirizine (ZYRTEC) 10 MG tablet Take 10 mg by mouth daily.   Yes [provider]  CRANBERRY PO Take 2 capsules by mouth daily.   Yes [provider]  imipramine (TOFRANIL) 25 MG tablet TAKE 3 TABLETS (75 MG TOTAL) BY MOUTH AT BEDTIME. 03/20/21  Yes Lomax, Amy, NP  levETIRAcetam (KEPPRA) 500 MG tablet Take 1 tablet (500 mg total) by mouth 2 (two) times daily. 03/26/21  Yes Lomax, Amy, NP  Magnesium 500 MG CAPS Take 500 mg by mouth 2 (two) times a week.    Yes [provider]  Multiple Vitamin (MULTI-VITAMIN DAILY PO) Take 1 tablet by mouth daily.   Yes [provider]  omeprazole (PRILOSEC) 40 MG capsule Take 1 capsule (40 mg total) by mouth daily. 05/08/17  Yes [name], MD  ondansetron (ZOFRAN) 4 MG tablet TAKE 1 TABLET BY MOUTH EVERY 8 HOURS AS  NEEDED Patient taking differently: Take 4 mg by mouth every 8 (eight) hours as needed for vomiting or nausea. 03/20/21  Yes Lomax, Amy, NP  ondansetron (ZOFRAN-ODT) 4 MG disintegrating tablet Take 1 tablet (4 mg total) by mouth every 8 (eight) hours as needed for nausea or vomiting. 07/24/21  Yes Isla Pence, MD  oxyCODONE-acetaminophen (PERCOCET/ROXICET) 5-325 MG tablet Take 1 tablet by mouth every 6 (six) hours as needed for severe pain. 07/24/21  Yes Isla Pence, MD  polyethylene glycol (MIRALAX / GLYCOLAX) packet Take 17 g by mouth daily as needed for mild constipation.   Yes [provider]  SUMAtriptan (IMITREX) 100 MG tablet TAKE 1 TAB AS NEEDED FOR MIGRAINE. MAY REPEAT IN 2 HRS IF HEADACHE PERSISTS OR RECURS. DNF 6/19 Patient taking differently: Take 100 mg by mouth as needed for migraine. 03/26/21  Yes Lomax, Amy, NP  tamsulosin (FLOMAX) 0.4 MG CAPS capsule Take 1 capsule (0.4 mg total) by mouth daily. 07/24/21  Yes Isla Pence, MD  VITAMIN D PO Take 5,000 Units by mouth daily.   Yes [provider]  atorvastatin (LIPITOR) 20 MG tablet Take 1 tablet (20 mg total) by mouth daily. Patient not taking: Reported on 07/24/2021 08/10/18   [name], NP  Galcanezumab-gnlm [facility]) 120 MG/ML SOAJ Inject 120 mg into the skin every 30 (thirty) days. Patient not  taking: Reported on 07/24/2021 03/26/21   Lomax, Amy, NP  Galcanezumab-gnlm (EMGALITY) 120 MG/ML SOAJ Inject 120 mg into the skin every 30 (thirty) days. Patient not taking: Reported on 07/24/2021 03/26/21   Debbora Presto, NP      Allergies    Robaxin [methocarbamol] and Lisinopril    Review of Systems   Review of Systems  Gastrointestinal:  Positive for nausea and vomiting.  Genitourinary:  Positive for flank pain.  All other systems reviewed and are negative.  Physical Exam Updated Vital Signs BP (!) 156/80    Pulse (!) 107    Temp 98 F (36.7 C) (Oral)    Resp 20    SpO2 96%  Physical Exam Vitals and nursing note  reviewed.  Constitutional:      Appearance: Normal appearance.  HENT:     Head: Normocephalic and atraumatic.     Right Ear: External ear normal.     Left Ear: External ear normal.     Nose: Nose normal.     Mouth/Throat:     Mouth: Mucous membranes are dry.  Eyes:     Extraocular Movements: Extraocular movements intact.     Conjunctiva/sclera: Conjunctivae normal.     Pupils: Pupils are equal, round, and reactive to light.  Cardiovascular:     Rate and Rhythm: Normal rate and regular rhythm.     Pulses: Normal pulses.     Heart sounds: Normal heart sounds.  Pulmonary:     Effort: Pulmonary effort is normal.     Breath sounds: Normal breath sounds.  Abdominal:     General: Abdomen is flat. Bowel sounds are normal.     Palpations: Abdomen is soft.  Musculoskeletal:        General: Normal range of motion.     Cervical back: Normal range of motion and neck supple.  Skin:    General: Skin is warm.     Capillary Refill: Capillary refill takes less than 2 seconds.  Neurological:     General: No focal deficit present.     Mental Status: Teresa Lozano is alert and oriented to person, place, and time.  Psychiatric:        Mood and Affect: Mood normal.        Behavior: Behavior normal.    ED Results / Procedures / Treatments   Labs (all labs ordered are listed, but only abnormal results are displayed) Labs Reviewed  COMPREHENSIVE METABOLIC PANEL - Abnormal; Notable for the following components:      Result Value   Glucose, Bld 166 (*)    Creatinine, Ser 1.10 (*)    Calcium 8.8 (*)    Total Protein 5.9 (*)    Albumin 3.4 (*)    GFR, Estimated 54 (*)    All other components within normal limits  CBC WITH DIFFERENTIAL/PLATELET - Abnormal; Notable for the following components:   WBC 13.8 (*)    Neutro Abs 12.6 (*)    Abs Immature Granulocytes 0.09 (*)    All other components within normal limits  URINALYSIS, ROUTINE W REFLEX MICROSCOPIC - Abnormal; Notable for the following  components:   Specific Gravity, Urine >1.030 (*)    Hgb urine dipstick LARGE (*)    All other components within normal limits  URINE CULTURE  LIPASE, BLOOD  URINALYSIS, MICROSCOPIC (REFLEX)    EKG None  Radiology CT RENAL STONE STUDY  Result Date: 07/24/2021 CLINICAL DATA:  Flank pain.  Kidney stones suspected. EXAM: CT ABDOMEN AND PELVIS WITHOUT CONTRAST  TECHNIQUE: Multidetector CT imaging of the abdomen and pelvis was performed following the standard protocol without IV contrast. COMPARISON:  04/01/2019 FINDINGS: Lower chest: Unremarkable. Hepatobiliary: No focal abnormality in the liver on this study without intravenous contrast. There is no evidence for gallstones, gallbladder wall thickening, or pericholecystic fluid. No intrahepatic or extrahepatic biliary dilation. Pancreas: No focal mass lesion. No dilatation of the main duct. No intraparenchymal cyst. No peripancreatic edema. Spleen: No splenomegaly. No focal mass lesion. Adrenals/Urinary Tract: No adrenal nodule or mass. 3 punctate stones are seen scattered in the right kidney without hydronephrosis. 4 5 tiny stones are seen in the left kidney measuring up to 3 mm. Stable 3.3 cm exophytic cyst upper pole left kidney. Mild to moderate left hydroureteronephrosis evident with possible 4 mm distal left ureteral stone on image 70 of series 3. No right hydroureteronephrosis. Bladder is decompressed. There is associated left Stomach/Bowel: Status post distal gastrectomy. No small bowel wall thickening. No small bowel dilatation. The terminal ileum is normal. The appendix is normal. Large stool volume with otherwise unremarkable appearance of the colon. Vascular/Lymphatic: There is moderate atherosclerotic calcification of the abdominal aorta without aneurysm. There is no gastrohepatic or hepatoduodenal ligament lymphadenopathy. No retroperitoneal or mesenteric lymphadenopathy. No pelvic sidewall lymphadenopathy. Reproductive: Unremarkable. Other: No  intraperitoneal free fluid. Musculoskeletal: No worrisome lytic or sclerotic osseous abnormality. IMPRESSION: 1. Mild to moderate left hydroureteronephrosis with possible 4 mm distal left ureteral stone. As the distal left ureter cannot be discretely identified in the low pelvis, the 4 mm stone cannot be definitely placed within the distal left ureter and the patient had a similar stone at this same location on the study from 2 years ago. As such, follow-up urology consultation recommended and hematuria protocol CT with and without contrast recommended to confirm that this stone is indeed within the distal left ureter and to exclude alternative etiology for the left ureteral obstruction. 2. Bilateral nonobstructing nephrolithiasis. 3. Large stool volume. Imaging features could be compatible with clinical constipation. 4. Aortic Atherosclerosis (ICD10-I70.0). Electronically Signed   By: Misty Stanley M.D.   On: 07/24/2021 06:33    Procedures Procedures    Medications Ordered in ED Medications  ondansetron (ZOFRAN-ODT) disintegrating tablet 4 mg (4 mg Oral Given 07/24/21 0456)  oxyCODONE-acetaminophen (PERCOCET/ROXICET) 5-325 MG per tablet 1 tablet (1 tablet Oral Given 07/24/21 0457)  ketorolac (TORADOL) 30 MG/ML injection 30 mg (30 mg Intramuscular Given 07/24/21 1717)  oxyCODONE-acetaminophen (PERCOCET/ROXICET) 5-325 MG per tablet 1 tablet (1 tablet Oral Given 07/24/21 1717)  ondansetron (ZOFRAN-ODT) disintegrating tablet 4 mg (4 mg Oral Given 07/24/21 1717)    ED Course/ Medical Decision Making/ A&P                           Medical Decision Making  Pt's symptoms have much improved.  Teresa Lozano waited several hours to be seen and does not want any IVFs or IV meds.  Teresa Lozano wants another dose of pain and nausea medication and to go home.  Pt's labs and CT reviewed by me.  I reviewed them with the patient and family member at bedside.  The pt has constipation on the CT and is told the pain medication will make that  worse.  Pt has miralax that Teresa Lozano will take.  Teresa Lozano is d/c with percocet, zofran, and flomax.  Teresa Lozano knows to f/u with urology.  Return if worse.          Final Clinical Impression(s) / ED  Diagnoses Final diagnoses:  Ureteral colic  Constipation, unspecified constipation type    Rx / DC Orders ED Discharge Orders          Ordered    oxyCODONE-acetaminophen (PERCOCET/ROXICET) 5-325 MG tablet  Every 6 hours PRN        07/24/21 1711    ondansetron (ZOFRAN-ODT) 4 MG disintegrating tablet  Every 8 hours PRN        07/24/21 1711    tamsulosin (FLOMAX) 0.4 MG CAPS capsule  Daily        07/24/21 1711              Isla Pence, MD 07/24/21 1816

## 2021-07-24 NOTE — ED Triage Notes (Signed)
Patient reporting left flank pain. Patient states "I think I am having a kidney stone"  Patient has hx of kidney stone

## 2021-07-24 NOTE — ED Provider Triage Note (Signed)
Emergency Medicine Provider Triage Evaluation Note  Teresa Lozano , a 71 y.o. female  was evaluated in triage.  Pt complains of left flank pain, nausea, and vomiting.  Patient reports that nausea and vomiting started approximately midnight.  Left flank pain started shortly thereafter.  Pain has been constant since then.  Patient reports that she has vomited multiple times.  States that emesis began as stomach contents and then transition to bilious.  Patient endorses associated dysuria.  Review of Systems  Positive: Nausea, vomiting, left flank pain, dysuria, constipation Negative: Fever, chills, urinary urgency, hematuria, vaginal pain, vaginal bleeding, vaginal discharge  Physical Exam  BP (!) 165/109 (BP Location: Right Arm)    Pulse (!) 104    Temp 99 F (37.2 C) (Oral)    Resp 16    SpO2 100%  Gen:   Awake, no distress   Resp:  Normal effort  MSK:   Moves extremities without difficulty  Other:  Abdomen soft, nondistended, tenderness to left upper quadrant and left flank.  Left CVA tenderness positive.  No guarding or rebound tenderness.  Medical Decision Making  Medically screening exam initiated at 5:00 AM.  Appropriate orders placed.  [name] was informed that the remainder of the evaluation will be completed by another provider, this initial triage assessment does not replace that evaluation, and the importance of remaining in the ED until their evaluation is complete.     Loni Beckwith, [place] 07/24/21 0502

## 2021-07-27 LAB — URINE CULTURE: Culture: 20000 — AB

## 2021-07-28 ENCOUNTER — Telehealth: Payer: Self-pay

## 2021-07-28 DIAGNOSIS — N201 Calculus of ureter: Secondary | ICD-10-CM | POA: Diagnosis not present

## 2021-07-28 NOTE — Telephone Encounter (Signed)
Post ED Visit - Positive Culture Follow-up  Culture report reviewed by antimicrobial stewardship pharmacist: [place] Team []  Elenor Quinones, Pharm.D. [x]  Heide Guile, Pharm.D., BCPS AQ-ID []  Parks Neptune, Pharm.D., BCPS []  Alycia Rossetti, Pharm.D., BCPS []  [place], [place].D., BCPS, AAHIVP []  Legrand Como, Pharm.D., BCPS, AAHIVP []  Salome Arnt, PharmD, BCPS []  Johnnette Gourd, PharmD, BCPS []  Hughes Better, PharmD, BCPS []  Leeroy Cha, PharmD []  Laqueta Linden, PharmD, BCPS []  Albertina Parr, PharmD  [place] Team []  Leodis Sias, PharmD []  Lindell Spar, PharmD []  Royetta Asal, PharmD []  Graylin Shiver, Rph []  Rema Fendt) Glennon Mac, PharmD []  Arlyn Dunning, PharmD []  Netta Cedars, PharmD []  Dia Sitter, PharmD []  Leone Haven, PharmD []  Gretta Arab, PharmD []  Theodis Shove, PharmD []  Peggyann Juba, PharmD []  Reuel Boom, PharmD   Positive urine culture Per ED provider Adrian Prows, no treatment needed and no further patient follow-up is required at this time.  Glennon Hamilton 07/28/2021, 10:05 AM

## 2021-08-11 DIAGNOSIS — N132 Hydronephrosis with renal and ureteral calculous obstruction: Secondary | ICD-10-CM | POA: Diagnosis not present

## 2021-08-11 DIAGNOSIS — N200 Calculus of kidney: Secondary | ICD-10-CM | POA: Diagnosis not present

## 2021-08-20 ENCOUNTER — Encounter (HOSPITAL_COMMUNITY): Payer: Self-pay | Admitting: Radiology

## 2021-08-20 DIAGNOSIS — I10 Essential (primary) hypertension: Secondary | ICD-10-CM | POA: Diagnosis not present

## 2021-08-20 DIAGNOSIS — G43909 Migraine, unspecified, not intractable, without status migrainosus: Secondary | ICD-10-CM | POA: Diagnosis not present

## 2021-08-20 DIAGNOSIS — Z1331 Encounter for screening for depression: Secondary | ICD-10-CM | POA: Diagnosis not present

## 2021-08-20 DIAGNOSIS — K279 Peptic ulcer, site unspecified, unspecified as acute or chronic, without hemorrhage or perforation: Secondary | ICD-10-CM | POA: Diagnosis not present

## 2021-08-20 DIAGNOSIS — K219 Gastro-esophageal reflux disease without esophagitis: Secondary | ICD-10-CM | POA: Diagnosis not present

## 2021-08-20 DIAGNOSIS — M810 Age-related osteoporosis without current pathological fracture: Secondary | ICD-10-CM | POA: Diagnosis not present

## 2021-08-20 DIAGNOSIS — K222 Esophageal obstruction: Secondary | ICD-10-CM | POA: Diagnosis not present

## 2021-08-20 DIAGNOSIS — Z23 Encounter for immunization: Secondary | ICD-10-CM | POA: Diagnosis not present

## 2021-08-20 DIAGNOSIS — E7800 Pure hypercholesterolemia, unspecified: Secondary | ICD-10-CM | POA: Diagnosis not present

## 2021-08-20 DIAGNOSIS — Z1339 Encounter for screening examination for other mental health and behavioral disorders: Secondary | ICD-10-CM | POA: Diagnosis not present

## 2021-09-22 DIAGNOSIS — E7800 Pure hypercholesterolemia, unspecified: Secondary | ICD-10-CM | POA: Diagnosis not present

## 2021-09-22 DIAGNOSIS — I10 Essential (primary) hypertension: Secondary | ICD-10-CM | POA: Diagnosis not present

## 2021-09-22 NOTE — Patient Instructions (Signed)
Below is our plan: ? ?We will levetiracetam and impramine. Add gabapentin '300mg'$  daily at bedtime.  ? ?Please make sure you are staying well hydrated. I recommend 50-60 ounces daily. Well balanced diet and regular exercise encouraged. Consistent sleep schedule with 6-8 hours recommended.  ? ?Please continue follow up with care team as directed.  ? ?Follow up with me in 6 months  ? ?You may receive a survey regarding today's visit. I encourage you to leave honest feed back as I do use this information to improve patient care. Thank you for seeing me today!  ? ? ?

## 2021-09-22 NOTE — Progress Notes (Signed)
Chief Complaint  Patient presents with   Follow-up    Pt alone, rm 1. Pt states that the Emgality injection was approved but the medication still cost 500$. States things are about the same    HISTORY OF PRESENT ILLNESS:  09/23/21 ALL:  Teresa Lozano returns for follow up for migraines. She continued levetiracetam and imipramine and added Emgality at last visit 03/2021. Injections were approved but copay was 500. She feels migraines are very well managed. She uses sumatriptan rarely but feels it is helpful. She continues to have daily tension headaches. Pain starts in the neck. She is s/p cervical fusion. She is taking butalbital about every other day.   03/26/2021 ALL:  Teresa Lozano returns for follow up for headaches. She added propranolol '20mg'$  BID at last visit and continued imipramine '75mg'$  QHS and levetiracetam '500mg'$  BID. Sumatriptan and butalbital help with abortive therapy. She has about 8-12 headache days with 4-5 migraines per month. 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 She usually takes 4-5 sumatriptan and 16 butalbital every month.   Tried and failed: topiramate (contraindicated for kidney stones), propranolol (edema), amitriptyline (ineffective), imipramine (on now), levetiracetam (on now)  09/23/2020 ALL:  Teresa Lozano is a 71 y.o. female here today for follow up for headaches. She continues imipramine '75mg'$  QHS and levetiracetam '500mg'$  BID for tension style and migraine prevention. She rarely takes Sumatriptan but it works for migraine abortion and Fioricet for tension headaches. She is having to take at least 3-4 doses every week. Medications work fairly well. She feels neck pain is stable. She has noted a higher heart rate for the past year. She denies chest pain or palpitations. No dizziness or lightheadedness. She does feel winded with working in her yard. She denies history of asthma. She does not drink  much water but does like tea.    HISTORY (copied from Dr Garth Bigness previous note)  Teresa Lozano is a 71 y.o. woman who has had headaches for 40 years.     Update 03/26/2020: She notes migraine headaches once a month but tension headaches are more frequently -once a week.   Most headaches are occipital and radiate forward.    She continues on imipramine 75 mg nightly and Keppra 500 mg p.o. twice daily.    She tolerates the medications well.   For migraines she takes sumatriptan and usually has a benefit.  For occipitla headaches she takes fioricet or  OTC Excedrin, often with benefit.     She had kidney stones so can't use topiramate or zonisamide.    She notes shooting pain in her fingers and toes.   This is better than last year.    Hand numbness often involves the ring fingers.     The 4th and 5th toes are more involved than the rest of the foot.   She has pain left > right.   NCV/EMG was normal last year.   She does not note weakness in the hand hands or feet.  She has some neck pain and back pain.   DATA CT has shown severe left facet changes at [facility] in 2011 and much milder facet hypertrophy to the right at [facility]. She has ACDF at [place] (Dr. Ronnald Ramp 2011)   REVIEW OF SYSTEMS: Out of a complete 14 system review of symptoms, the patient complains only of the following symptoms, headaches, neck pain, elevated heart rate and all other reviewed systems  are negative.   ALLERGIES: Allergies  Allergen Reactions   Robaxin [Methocarbamol] Nausea And Vomiting   Lisinopril Cough     HOME MEDICATIONS: Outpatient Medications Prior to Visit  Medication Sig Dispense Refill   butalbital-acetaminophen-caffeine (FIORICET) 50-325-40 MG tablet One to two tablets by mouth as needed for headache. No more than 4/week (Patient taking differently: Take 1-2 tablets by mouth every 6 (six) hours as needed for headache or migraine.) 16 tablet 5   cetirizine (ZYRTEC) 10 MG tablet Take 10 mg by mouth daily.      CRANBERRY PO Take 2 capsules by mouth daily.     imipramine (TOFRANIL) 25 MG tablet TAKE 3 TABLETS (75 MG TOTAL) BY MOUTH AT BEDTIME. 270 tablet 3   levETIRAcetam (KEPPRA) 500 MG tablet Take 1 tablet (500 mg total) by mouth 2 (two) times daily. 180 tablet 3   Magnesium 500 MG CAPS Take 500 mg by mouth 2 (two) times a week.      Multiple Vitamin (MULTI-VITAMIN DAILY PO) Take 1 tablet by mouth daily.     omeprazole (PRILOSEC) 40 MG capsule Take 1 capsule (40 mg total) by mouth daily. 60 capsule 5   ondansetron (ZOFRAN) 4 MG tablet TAKE 1 TABLET BY MOUTH EVERY 8 HOURS AS NEEDED (Patient taking differently: Take 4 mg by mouth every 8 (eight) hours as needed for vomiting or nausea.) 20 tablet 5   ondansetron (ZOFRAN-ODT) 4 MG disintegrating tablet Take 1 tablet (4 mg total) by mouth every 8 (eight) hours as needed for nausea or vomiting. 20 tablet 0   polyethylene glycol (MIRALAX / GLYCOLAX) packet Take 17 g by mouth daily as needed for mild constipation.     rosuvastatin (CRESTOR) 10 MG tablet Take by mouth.     SUMAtriptan (IMITREX) 100 MG tablet TAKE 1 TAB AS NEEDED FOR MIGRAINE. MAY REPEAT IN 2 HRS IF HEADACHE PERSISTS OR RECURS. DNF 6/19 (Patient taking differently: Take 100 mg by mouth as needed for migraine.) 10 tablet 11   tamsulosin (FLOMAX) 0.4 MG CAPS capsule Take 1 capsule (0.4 mg total) by mouth daily. 14 capsule 0   VITAMIN D PO Take 5,000 Units by mouth daily.     amLODipine (NORVASC) 10 MG tablet TAKE 1 TABLET BY MOUTH EVERY DAY (Patient taking differently: Take 10 mg by mouth daily.) 90 tablet 0   atorvastatin (LIPITOR) 20 MG tablet Take 1 tablet (20 mg total) by mouth daily. (Patient not taking: Reported on 07/24/2021) 90 tablet 0   Galcanezumab-gnlm (EMGALITY) 120 MG/ML SOAJ Inject 120 mg into the skin every 30 (thirty) days. (Patient not taking: Reported on 07/24/2021) 1 mL 11   Galcanezumab-gnlm (EMGALITY) 120 MG/ML SOAJ Inject 120 mg into the skin every 30 (thirty) days. (Patient not  taking: Reported on 07/24/2021) 2 mL 0   oxyCODONE-acetaminophen (PERCOCET/ROXICET) 5-325 MG tablet Take 1 tablet by mouth every 6 (six) hours as needed for severe pain. 15 tablet 0   Facility-Administered Medications Prior to Visit  Medication Dose Route Frequency Provider Last Rate Last Admin   0.9 %  sodium chloride infusion  500 mL Intravenous Once Armbruster, Carlota Raspberry, MD         PAST MEDICAL HISTORY: Past Medical History:  Diagnosis Date   Allergy    Arthritis    Chicken pox    Frequent headaches    GERD (gastroesophageal reflux disease)    History of kidney stones    Hyperlipidemia    Hypertension    Osteoporosis  PAST SURGICAL HISTORY: Past Surgical History:  Procedure Laterality Date   CERVICAL FUSION  2000   COLONOSCOPY     ESOPHAGOGASTRODUODENOSCOPY  11/2016   ESOPHAGOGASTRODUODENOSCOPY (EGD) WITH PROPOFOL N/A 12/28/2016   Procedure: ESOPHAGOGASTRODUODENOSCOPY (EGD) WITH PROPOFOL;  Surgeon: Manus Gunning, MD;  Location: [facility];  Service: Gastroenterology;  Laterality: N/A;   EYE SURGERY     STOMACH SURGERY  04/2017   REMOVED ULCER   TONSILLECTOMY     UPPER GASTROINTESTINAL ENDOSCOPY       FAMILY HISTORY: Family History  Problem Relation Age of Onset   Hyperlipidemia Mother    Hypertension Mother    Prostate cancer Father    Hyperlipidemia Father    Heart disease Father    Heart disease Brother    Hypertension Sister    Esophageal cancer Sister    Hypertension Maternal Grandmother    Colon cancer Neg Hx    Colon polyps Neg Hx    Stomach cancer Neg Hx    Rectal cancer Neg Hx      SOCIAL HISTORY: Social History   Socioeconomic History   Marital status: Widowed    Spouse name: Not on file   Number of children: Not on file   Years of education: Not on file   Highest education level: Not on file  Occupational History   Not on file  Tobacco Use   Smoking status: Never   Smokeless tobacco: Never  Vaping Use   Vaping Use:  Never used  Substance and Sexual Activity   Alcohol use: No    Alcohol/week: 0.0 standard drinks   Drug use: No   Sexual activity: Never  Other Topics Concern   Not on file  Social History Narrative   Not on file   Social Determinants of Health   Financial Resource Strain: Not on file  Food Insecurity: Not on file  Transportation Needs: Not on file  Physical Activity: Not on file  Stress: Not on file  Social Connections: Not on file  Intimate Partner Violence: Not on file      PHYSICAL EXAM  Vitals:   09/23/21 1013  BP: 139/81  Pulse: (!) 107  Weight: 104 lb (47.2 kg)  Height: '5\' 2"'$  (1.575 m)     Body mass index is 19.02 kg/m.   Generalized: Well developed, in no acute distress  Cardiology: normal rate and rhythm, no murmur auscultated  Respiratory: clear to auscultation bilaterally    Neurological examination  Mentation: Alert oriented to time, place, history taking. Follows all commands speech and language fluent Cranial nerve II-XII: Pupils were equal round reactive to light. Extraocular movements were full, visual field were full on confrontational test. Facial sensation and strength were normal. Head turning and shoulder shrug  were normal and symmetric. Motor: The motor testing reveals 5 over 5 strength of all 4 extremities. Good symmetric motor tone is noted throughout.  Gait and station: Gait is normal.     DIAGNOSTIC DATA (LABS, IMAGING, TESTING) - I reviewed patient records, labs, notes, testing and imaging myself where available.  Lab Results  Component Value Date   WBC 13.8 (H) 07/24/2021   HGB 12.0 07/24/2021   HCT 38.1 07/24/2021   MCV 94.8 07/24/2021   PLT 359 07/24/2021      Component Value Date/Time   NA 141 07/24/2021 0452   K 4.3 07/24/2021 0452   CL 106 07/24/2021 0452   CO2 25 07/24/2021 0452   GLUCOSE 166 (H) 07/24/2021 0452   BUN  17 07/24/2021 0452   CREATININE 1.10 (H) 07/24/2021 0452   CALCIUM 8.8 (L) 07/24/2021 0452    PROT 5.9 (L) 07/24/2021 0452   ALBUMIN 3.4 (L) 07/24/2021 0452   AST 29 07/24/2021 0452   ALT 22 07/24/2021 0452   ALKPHOS 99 07/24/2021 0452   BILITOT 0.5 07/24/2021 0452   GFRNONAA 54 (L) 07/24/2021 0452   GFRAA 47 (L) 03/20/2020 1021   Lab Results  Component Value Date   CHOL 168 04/05/2019   HDL 55.10 04/05/2019   LDLCALC 85 04/05/2019   TRIG 140.0 04/05/2019   CHOLHDL 3 04/05/2019   Lab Results  Component Value Date   HGBA1C 5.2 12/24/2019   No results found for: VITAMINB12 No results found for: TSH  No flowsheet data found.   No flowsheet data found.   ASSESSMENT AND PLAN  71 y.o. year old female  has a past medical history of Allergy, Arthritis, Chicken pox, Frequent headaches, GERD (gastroesophageal reflux disease), History of kidney stones, Hyperlipidemia, Hypertension, and Osteoporosis. here with   Common migraine without intractability  She is doing well, today. Headaches are fairly well managed but she does continue to have regular tension style headaches and migraines. She will continue imipramine '75mg'$  daily and levetiracetam '500mg'$  BID. I will add gabapentin '300mg'$  QHS with plans to increase if well tolerated. Healthy lifestyle habits encouraged. She will continue sumatriptan and Fioricet as prescribed. She was advised to try to wean butalbital as tolerated. She will follow up in 6 months, sooner if needed.   No orders of the defined types were placed in this encounter.    Meds ordered this encounter  Medications   gabapentin (NEURONTIN) 300 MG capsule    Sig: Take 1 capsule (300 mg total) by mouth at bedtime.    Dispense:  90 capsule    Refill:  3    Order Specific Question:   Supervising Provider    Answer:   Melvenia Beam [[id number]]     Debbora Presto, MSN, FNP-C 09/23/2021, 10:54 AM  [facility] [address] [place], [place] [address] [phone]

## 2021-09-23 ENCOUNTER — Encounter: Payer: Self-pay | Admitting: Family Medicine

## 2021-09-23 ENCOUNTER — Ambulatory Visit (INDEPENDENT_AMBULATORY_CARE_PROVIDER_SITE_OTHER): Payer: Medicare Other | Admitting: Family Medicine

## 2021-09-23 VITALS — BP 139/81 | HR 107 | Ht 62.0 in | Wt 104.0 lb

## 2021-09-23 DIAGNOSIS — G43009 Migraine without aura, not intractable, without status migrainosus: Secondary | ICD-10-CM | POA: Diagnosis not present

## 2021-09-23 MED ORDER — GABAPENTIN 300 MG PO CAPS: 300.0000 mg | ORAL_CAPSULE | Freq: Every day | ORAL | 3 refills | Status: DC

## 2021-09-24 ENCOUNTER — Other Ambulatory Visit: Payer: Self-pay | Admitting: Family Medicine

## 2021-09-24 MED ORDER — BUTALBITAL-APAP-CAFFEINE 50-325-40 MG PO TABS: ORAL_TABLET | ORAL | 5 refills | Status: DC

## 2021-09-24 NOTE — Telephone Encounter (Signed)
Pt is requesting a refill for butalbital-acetaminophen-caffeine (FIORICET) 50-325-40 MG tablet . ? ?Pharmacy: [id number] ? ?

## 2021-09-24 NOTE — Telephone Encounter (Signed)
Last OV was on 09/23/21.  ?Next OV is scheduled for 03/30/22 .  ?Last RX was written on 07/16/21 for 16 tabs.  ? ?[place] [facility] has been reviewed.  ?

## 2021-10-06 DIAGNOSIS — Z8262 Family history of osteoporosis: Secondary | ICD-10-CM | POA: Diagnosis not present

## 2021-10-06 DIAGNOSIS — M816 Localized osteoporosis [Lequesne]: Secondary | ICD-10-CM | POA: Diagnosis not present

## 2021-10-06 DIAGNOSIS — N200 Calculus of kidney: Secondary | ICD-10-CM | POA: Insufficient documentation

## 2021-10-06 DIAGNOSIS — N958 Other specified menopausal and perimenopausal disorders: Secondary | ICD-10-CM | POA: Diagnosis not present

## 2021-10-06 DIAGNOSIS — Z124 Encounter for screening for malignant neoplasm of cervix: Secondary | ICD-10-CM | POA: Diagnosis not present

## 2021-10-06 DIAGNOSIS — G43909 Migraine, unspecified, not intractable, without status migrainosus: Secondary | ICD-10-CM | POA: Insufficient documentation

## 2021-10-06 DIAGNOSIS — Z1231 Encounter for screening mammogram for malignant neoplasm of breast: Secondary | ICD-10-CM | POA: Diagnosis not present

## 2021-10-06 DIAGNOSIS — Z681 Body mass index (BMI) 19 or less, adult: Secondary | ICD-10-CM | POA: Diagnosis not present

## 2021-10-22 ENCOUNTER — Other Ambulatory Visit: Payer: Self-pay | Admitting: Obstetrics and Gynecology

## 2021-10-22 DIAGNOSIS — N6453 Retraction of nipple: Secondary | ICD-10-CM

## 2021-11-02 ENCOUNTER — Ambulatory Visit
Admission: RE | Admit: 2021-11-02 | Discharge: 2021-11-02 | Disposition: A | Discharge status: Home / Self Care | Payer: Medicare Other | Source: Ambulatory Visit | Attending: Obstetrics and Gynecology | Admitting: Obstetrics and Gynecology

## 2021-11-02 DIAGNOSIS — R922 Inconclusive mammogram: Secondary | ICD-10-CM | POA: Diagnosis not present

## 2021-11-02 DIAGNOSIS — N6453 Retraction of nipple: Secondary | ICD-10-CM

## 2021-11-03 ENCOUNTER — Other Ambulatory Visit: Payer: Self-pay | Admitting: Obstetrics and Gynecology

## 2021-11-03 DIAGNOSIS — N6459 Other signs and symptoms in breast: Secondary | ICD-10-CM

## 2021-11-17 ENCOUNTER — Ambulatory Visit
Admission: RE | Admit: 2021-11-17 | Discharge: 2021-11-17 | Disposition: A | Discharge status: Home / Self Care | Payer: Medicare Other | Source: Ambulatory Visit | Attending: Obstetrics and Gynecology | Admitting: Obstetrics and Gynecology

## 2021-11-17 DIAGNOSIS — N6459 Other signs and symptoms in breast: Secondary | ICD-10-CM

## 2021-11-17 DIAGNOSIS — N6453 Retraction of nipple: Secondary | ICD-10-CM | POA: Diagnosis not present

## 2021-11-17 MED ORDER — GADOBUTROL 1 MMOL/ML IV SOLN
5.0000 mL | Freq: Once | INTRAVENOUS | Status: AC | PRN
  Administered 2021-11-17: 5 mL via INTRAVENOUS

## 2021-12-23 DIAGNOSIS — L821 Other seborrheic keratosis: Secondary | ICD-10-CM | POA: Diagnosis not present

## 2021-12-23 DIAGNOSIS — I10 Essential (primary) hypertension: Secondary | ICD-10-CM | POA: Diagnosis not present

## 2021-12-23 DIAGNOSIS — M810 Age-related osteoporosis without current pathological fracture: Secondary | ICD-10-CM | POA: Diagnosis not present

## 2021-12-23 DIAGNOSIS — K219 Gastro-esophageal reflux disease without esophagitis: Secondary | ICD-10-CM | POA: Diagnosis not present

## 2022-02-08 DIAGNOSIS — N200 Calculus of kidney: Secondary | ICD-10-CM | POA: Diagnosis not present

## 2022-02-17 DIAGNOSIS — J029 Acute pharyngitis, unspecified: Secondary | ICD-10-CM | POA: Diagnosis not present

## 2022-02-17 DIAGNOSIS — Z1152 Encounter for screening for COVID-19: Secondary | ICD-10-CM | POA: Diagnosis not present

## 2022-02-17 DIAGNOSIS — J069 Acute upper respiratory infection, unspecified: Secondary | ICD-10-CM | POA: Diagnosis not present

## 2022-02-25 ENCOUNTER — Other Ambulatory Visit: Payer: Self-pay | Admitting: Neurology

## 2022-03-03 DIAGNOSIS — I129 Hypertensive chronic kidney disease with stage 1 through stage 4 chronic kidney disease, or unspecified chronic kidney disease: Secondary | ICD-10-CM | POA: Diagnosis not present

## 2022-03-03 DIAGNOSIS — Z87442 Personal history of urinary calculi: Secondary | ICD-10-CM | POA: Diagnosis not present

## 2022-03-03 DIAGNOSIS — N1831 Chronic kidney disease, stage 3a: Secondary | ICD-10-CM | POA: Diagnosis not present

## 2022-03-04 DIAGNOSIS — M810 Age-related osteoporosis without current pathological fracture: Secondary | ICD-10-CM | POA: Diagnosis not present

## 2022-03-04 DIAGNOSIS — I10 Essential (primary) hypertension: Secondary | ICD-10-CM | POA: Diagnosis not present

## 2022-03-04 DIAGNOSIS — K279 Peptic ulcer, site unspecified, unspecified as acute or chronic, without hemorrhage or perforation: Secondary | ICD-10-CM | POA: Diagnosis not present

## 2022-03-04 DIAGNOSIS — K222 Esophageal obstruction: Secondary | ICD-10-CM | POA: Diagnosis not present

## 2022-03-08 DIAGNOSIS — I129 Hypertensive chronic kidney disease with stage 1 through stage 4 chronic kidney disease, or unspecified chronic kidney disease: Secondary | ICD-10-CM | POA: Diagnosis not present

## 2022-03-08 DIAGNOSIS — Z87442 Personal history of urinary calculi: Secondary | ICD-10-CM | POA: Diagnosis not present

## 2022-03-08 DIAGNOSIS — N1831 Chronic kidney disease, stage 3a: Secondary | ICD-10-CM | POA: Diagnosis not present

## 2022-03-29 NOTE — Patient Instructions (Signed)
Below is our plan:  We will continues imipramine '75mg'$  QHS and levetiracetam '500mg'$  twice daily. We will add cyclobenzaprine '5mg'$  daily at bedtime. I am hopeful this will help with neck pain and tension. Continue sumatriptan as needed for migraines. Please try to limit use of Excedrin and butalbital. These medicaitons cause rebound headaches.   If headaches are not improved in 4 weeks, call me and we will increase levetiracetam to '750mg'$  twice daily.    Consider sleep study in the future if headaches are not improving.   Please make sure you are staying well hydrated. I recommend 50-60 ounces daily. Well balanced diet and regular exercise encouraged. Consistent sleep schedule with 6-8 hours recommended.   Please continue follow up with care team as directed.   Follow up with me in 6 months   You may receive a survey regarding today's visit. I encourage you to leave honest feed back as I do use this information to improve patient care. Thank you for seeing me today!

## 2022-03-29 NOTE — Progress Notes (Unsigned)
No chief complaint on file.   HISTORY OF PRESENT ILLNESS:  03/29/22 ALL:  Teresa Lozano returns for follow up for headaches. We continues imipramine '75mg'$  QHS and levetiracetam '500mg'$  BID but added gabapentin '300mg'$  QHS for continued tension style headaches. Since,   09/23/2021 ALL: Teresa Lozano returns for follow up for migraines. She continued levetiracetam and imipramine and added Emgality at last visit 03/2021. Injections were approved but copay was 500. She feels migraines are very well managed. She uses sumatriptan rarely but feels it is helpful. She continues to have daily tension headaches. Pain starts in the neck. She is s/p cervical fusion. She is taking butalbital about every other day.   03/26/2021 ALL:  Teresa Lozano returns for follow up for headaches. She added propranolol '20mg'$  BID at last visit and continued imipramine '75mg'$  QHS and levetiracetam '500mg'$  BID. Sumatriptan and butalbital help with abortive therapy. She has about 8-12 headache days with 4-5 migraines per month. 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 She usually takes 4-5 sumatriptan and 16 butalbital every month.   Tried and failed: topiramate (contraindicated for kidney stones), propranolol (edema), amitriptyline (ineffective), imipramine (on now), levetiracetam (on now)  09/23/2020 ALL:  Teresa Lozano is a 71 y.o. female here today for follow up for headaches. She continues imipramine '75mg'$  QHS and levetiracetam '500mg'$  BID for tension style and migraine prevention. She rarely takes Sumatriptan but it works for migraine abortion and Fioricet for tension headaches. She is having to take at least 3-4 doses every week. Medications work fairly well. She feels neck pain is stable. She has noted a higher heart rate for the past year. She denies chest pain or palpitations. No dizziness or lightheadedness. She does feel winded with working in her yard. She denies  history of asthma. She does not drink much water but does like tea.    HISTORY (copied from Dr Garth Bigness previous note)  Teresa Lozano is a 71 y.o. woman who has had headaches for 40 years.     Update 03/26/2020: She notes migraine headaches once a month but tension headaches are more frequently -once a week.   Most headaches are occipital and radiate forward.    She continues on imipramine 75 mg nightly and Keppra 500 mg p.o. twice daily.    She tolerates the medications well.   For migraines she takes sumatriptan and usually has a benefit.  For occipitla headaches she takes fioricet or  OTC Excedrin, often with benefit.     She had kidney stones so can't use topiramate or zonisamide.    She notes shooting pain in her fingers and toes.   This is better than last year.    Hand numbness often involves the ring fingers.     The 4th and 5th toes are more involved than the rest of the foot.   She has pain left > right.   NCV/EMG was normal last year.   She does not note weakness in the hand hands or feet.  She has some neck pain and back pain.   DATA CT has shown severe left facet changes at [facility] in 2011 and much milder facet hypertrophy to the right at [facility]. She has ACDF at [place] (Dr. Ronnald Ramp 2011)   REVIEW OF SYSTEMS: Out of a complete 14 system review of symptoms, the patient complains only of the following symptoms, headaches, neck pain, elevated heart rate and all other reviewed systems are  negative.   ALLERGIES: Allergies  Allergen Reactions   Robaxin [Methocarbamol] Nausea And Vomiting   Lisinopril Cough     HOME MEDICATIONS: Outpatient Medications Prior to Visit  Medication Sig Dispense Refill   butalbital-acetaminophen-caffeine (FIORICET) 50-325-40 MG tablet One to two tablets by mouth as needed for headache. No more than 4/week 16 tablet 5   cetirizine (ZYRTEC) 10 MG tablet Take 10 mg by mouth daily.     CRANBERRY PO Take 2 capsules by mouth daily.     gabapentin (NEURONTIN) 300 MG  capsule Take 1 capsule (300 mg total) by mouth at bedtime. 90 capsule 3   imipramine (TOFRANIL) 25 MG tablet TAKE 3 TABLETS (75 MG TOTAL) BY MOUTH AT BEDTIME. 270 tablet 3   levETIRAcetam (KEPPRA) 500 MG tablet Take 1 tablet (500 mg total) by mouth 2 (two) times daily. 180 tablet 3   Magnesium 500 MG CAPS Take 500 mg by mouth 2 (two) times a week.      Multiple Vitamin (MULTI-VITAMIN DAILY PO) Take 1 tablet by mouth daily.     omeprazole (PRILOSEC) 40 MG capsule Take 1 capsule (40 mg total) by mouth daily. 60 capsule 5   ondansetron (ZOFRAN) 4 MG tablet TAKE 1 TABLET BY MOUTH EVERY 8 HOURS AS NEEDED (Patient taking differently: Take 4 mg by mouth every 8 (eight) hours as needed for vomiting or nausea.) 20 tablet 5   ondansetron (ZOFRAN-ODT) 4 MG disintegrating tablet Take 1 tablet (4 mg total) by mouth every 8 (eight) hours as needed for nausea or vomiting. 20 tablet 0   polyethylene glycol (MIRALAX / GLYCOLAX) packet Take 17 g by mouth daily as needed for mild constipation.     rosuvastatin (CRESTOR) 10 MG tablet Take by mouth.     SUMAtriptan (IMITREX) 100 MG tablet TAKE 1 TAB AS NEEDED FOR MIGRAINE. MAY REPEAT IN 2 HRS IF HEADACHE PERSISTS OR RECURS. DNF 6/19 (Patient taking differently: Take 100 mg by mouth as needed for migraine.) 10 tablet 11   tamsulosin (FLOMAX) 0.4 MG CAPS capsule Take 1 capsule (0.4 mg total) by mouth daily. 14 capsule 0   VITAMIN D PO Take 5,000 Units by mouth daily.     Facility-Administered Medications Prior to Visit  Medication Dose Route Frequency Provider Last Rate Last Admin   0.9 %  sodium chloride infusion  500 mL Intravenous Once Armbruster, Carlota Raspberry, MD         PAST MEDICAL HISTORY: Past Medical History:  Diagnosis Date   Allergy    Arthritis    Chicken pox    Frequent headaches    GERD (gastroesophageal reflux disease)    History of kidney stones    Hyperlipidemia    Hypertension    Osteoporosis      PAST SURGICAL HISTORY: Past Surgical  History:  Procedure Laterality Date   CERVICAL FUSION  2000   COLONOSCOPY     ESOPHAGOGASTRODUODENOSCOPY  11/2016   ESOPHAGOGASTRODUODENOSCOPY (EGD) WITH PROPOFOL N/A 12/28/2016   Procedure: ESOPHAGOGASTRODUODENOSCOPY (EGD) WITH PROPOFOL;  Surgeon: Manus Gunning, MD;  Location: [facility];  Service: Gastroenterology;  Laterality: N/A;   EYE SURGERY     STOMACH SURGERY  04/2017   REMOVED ULCER   TONSILLECTOMY     UPPER GASTROINTESTINAL ENDOSCOPY       FAMILY HISTORY: Family History  Problem Relation Age of Onset   Hyperlipidemia Mother    Hypertension Mother    Prostate cancer Father    Hyperlipidemia Father    Heart disease  Father    Heart disease Brother    Hypertension Sister    Esophageal cancer Sister    Hypertension Maternal Grandmother    Colon cancer Neg Hx    Colon polyps Neg Hx    Stomach cancer Neg Hx    Rectal cancer Neg Hx      SOCIAL HISTORY: Social History   Socioeconomic History   Marital status: Widowed    Spouse name: Not on file   Number of children: Not on file   Years of education: Not on file   Highest education level: Not on file  Occupational History   Not on file  Tobacco Use   Smoking status: Never   Smokeless tobacco: Never  Vaping Use   Vaping Use: Never used  Substance and Sexual Activity   Alcohol use: No    Alcohol/week: 0.0 standard drinks of alcohol   Drug use: No   Sexual activity: Never  Other Topics Concern   Not on file  Social History Narrative   Not on file   Social Determinants of Health   Financial Resource Strain: Not on file  Food Insecurity: Not on file  Transportation Needs: Not on file  Physical Activity: Not on file  Stress: Not on file  Social Connections: Not on file  Intimate Partner Violence: Not on file      PHYSICAL EXAM  There were no vitals filed for this visit.    There is no height or weight on file to calculate BMI.   Generalized: Well developed, in no acute  distress  Cardiology: normal rate and rhythm, no murmur auscultated  Respiratory: clear to auscultation bilaterally    Neurological examination  Mentation: Alert oriented to time, place, history taking. Follows all commands speech and language fluent Cranial nerve II-XII: Pupils were equal round reactive to light. Extraocular movements were full, visual field were full on confrontational test. Facial sensation and strength were normal. Head turning and shoulder shrug  were normal and symmetric. Motor: The motor testing reveals 5 over 5 strength of all 4 extremities. Good symmetric motor tone is noted throughout.  Gait and station: Gait is normal.     DIAGNOSTIC DATA (LABS, IMAGING, TESTING) - I reviewed patient records, labs, notes, testing and imaging myself where available.  Lab Results  Component Value Date   WBC 13.8 (H) 07/24/2021   HGB 12.0 07/24/2021   HCT 38.1 07/24/2021   MCV 94.8 07/24/2021   PLT 359 07/24/2021      Component Value Date/Time   NA 141 07/24/2021 0452   K 4.3 07/24/2021 0452   CL 106 07/24/2021 0452   CO2 25 07/24/2021 0452   GLUCOSE 166 (H) 07/24/2021 0452   BUN 17 07/24/2021 0452   CREATININE 1.10 (H) 07/24/2021 0452   CALCIUM 8.8 (L) 07/24/2021 0452   PROT 5.9 (L) 07/24/2021 0452   ALBUMIN 3.4 (L) 07/24/2021 0452   AST 29 07/24/2021 0452   ALT 22 07/24/2021 0452   ALKPHOS 99 07/24/2021 0452   BILITOT 0.5 07/24/2021 0452   GFRNONAA 54 (L) 07/24/2021 0452   GFRAA 47 (L) 03/20/2020 1021   Lab Results  Component Value Date   CHOL 168 04/05/2019   HDL 55.10 04/05/2019   LDLCALC 85 04/05/2019   TRIG 140.0 04/05/2019   CHOLHDL 3 04/05/2019   Lab Results  Component Value Date   HGBA1C 5.2 12/24/2019   No results found for: "VITAMINB12" No results found for: "TSH"      No data  to display               No data to display           ASSESSMENT AND PLAN  71 y.o. year old female  has a past medical history of Allergy,  Arthritis, Chicken pox, Frequent headaches, GERD (gastroesophageal reflux disease), History of kidney stones, Hyperlipidemia, Hypertension, and Osteoporosis. here with   No diagnosis found.  She is doing well, today. Headaches are fairly well managed but she does continue to have regular tension style headaches and migraines. She will continue imipramine '75mg'$  daily and levetiracetam '500mg'$  BID. I will add gabapentin '300mg'$  QHS with plans to increase if well tolerated. Healthy lifestyle habits encouraged. She will continue sumatriptan and Fioricet as prescribed. She was advised to try to wean butalbital as tolerated. She will follow up in 6 months, sooner if needed.   No orders of the defined types were placed in this encounter.    No orders of the defined types were placed in this encounter.    Debbora Presto, MSN, FNP-C 03/29/2022, 9:19 AM  [facility] [address] [place], [place] [address] [phone]

## 2022-03-30 ENCOUNTER — Encounter: Payer: Self-pay | Admitting: Family Medicine

## 2022-03-30 ENCOUNTER — Ambulatory Visit (INDEPENDENT_AMBULATORY_CARE_PROVIDER_SITE_OTHER): Payer: Medicare Other | Admitting: Family Medicine

## 2022-03-30 VITALS — BP 125/70 | HR 88 | Ht 62.0 in | Wt 103.2 lb

## 2022-03-30 DIAGNOSIS — G43009 Migraine without aura, not intractable, without status migrainosus: Secondary | ICD-10-CM | POA: Diagnosis not present

## 2022-03-30 DIAGNOSIS — R1013 Epigastric pain: Secondary | ICD-10-CM

## 2022-03-30 DIAGNOSIS — G44219 Episodic tension-type headache, not intractable: Secondary | ICD-10-CM | POA: Diagnosis not present

## 2022-03-30 MED ORDER — BUTALBITAL-APAP-CAFFEINE 50-325-40 MG PO TABS: ORAL_TABLET | ORAL | 5 refills | Status: DC

## 2022-03-30 MED ORDER — IMIPRAMINE HCL 25 MG PO TABS: 75.0000 mg | ORAL_TABLET | Freq: Every day | ORAL | 3 refills | Status: DC

## 2022-03-30 MED ORDER — SUMATRIPTAN SUCCINATE 100 MG PO TABS: ORAL_TABLET | ORAL | 11 refills | Status: DC

## 2022-03-30 MED ORDER — ONDANSETRON HCL 4 MG PO TABS: 4.0000 mg | ORAL_TABLET | Freq: Three times a day (TID) | ORAL | 5 refills | Status: DC | PRN

## 2022-03-30 MED ORDER — LEVETIRACETAM 500 MG PO TABS: 500.0000 mg | ORAL_TABLET | Freq: Two times a day (BID) | ORAL | 3 refills | Status: DC

## 2022-03-30 MED ORDER — CYCLOBENZAPRINE HCL 5 MG PO TABS: 5.0000 mg | ORAL_TABLET | Freq: Every day | ORAL | 1 refills | Status: DC

## 2022-04-07 ENCOUNTER — Other Ambulatory Visit (HOSPITAL_COMMUNITY): Payer: Self-pay | Admitting: *Deleted

## 2022-04-09 ENCOUNTER — Ambulatory Visit (HOSPITAL_COMMUNITY)
Admission: RE | Admit: 2022-04-09 | Discharge: 2022-04-09 | Disposition: A | Discharge status: Home / Self Care | Payer: Medicare Other | Source: Ambulatory Visit | Attending: Internal Medicine | Admitting: Internal Medicine

## 2022-04-09 DIAGNOSIS — M810 Age-related osteoporosis without current pathological fracture: Secondary | ICD-10-CM | POA: Diagnosis not present

## 2022-04-09 MED ORDER — ZOLEDRONIC ACID 5 MG/100ML IV SOLN
INTRAVENOUS | Status: AC
  Filled 2022-04-09: qty 100

## 2022-04-09 MED ORDER — ZOLEDRONIC ACID 5 MG/100ML IV SOLN
5.0000 mg | Freq: Once | INTRAVENOUS | Status: AC
  Administered 2022-04-09: 5 mg via INTRAVENOUS

## 2022-04-15 DIAGNOSIS — L814 Other melanin hyperpigmentation: Secondary | ICD-10-CM | POA: Diagnosis not present

## 2022-04-15 DIAGNOSIS — D1801 Hemangioma of skin and subcutaneous tissue: Secondary | ICD-10-CM | POA: Diagnosis not present

## 2022-04-15 DIAGNOSIS — L821 Other seborrheic keratosis: Secondary | ICD-10-CM | POA: Diagnosis not present

## 2022-05-04 IMAGING — MR MR BREAST BILAT WO/W CM
8 of 12 series · 34 of 48 positions shown · IV contrast (5ML GADAVIST)
Comparison: None Available.

CLINICAL DATA: Left nipple inversion.

EXAM:
BILATERAL BREAST MRI WITH AND WITHOUT CONTRAST
TECHNIQUE: Multiplanar, multisequence MR images of both breasts were obtained
prior to and following the intravenous administration of 5 ml of
Gadavist

[Series 2: t2_tirm_tra ipat (a-p) · axial · 3.0mm · 0.59mm/px · 1 of 55 slices shown]
[im 1/55]
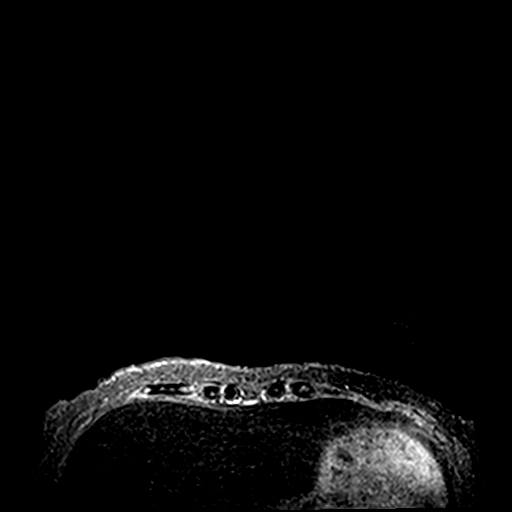

[Series 3: fl3d pre-cm no · axial · non-contrast · 1.2mm · 0.78mm/px · z∈[-62,+110]mm · 5 of 144 slices shown]
[im 1/144]
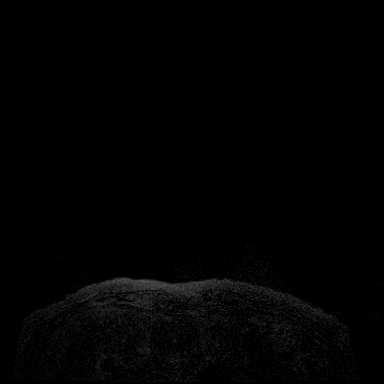
[im 36/144]
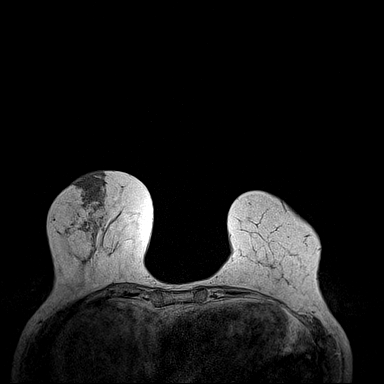
[im 72/144]
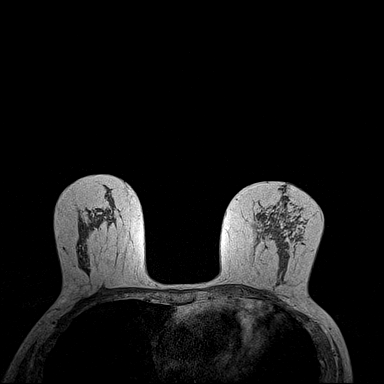
[im 108/144]
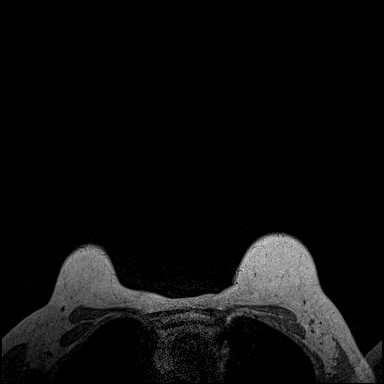
[im 144/144]
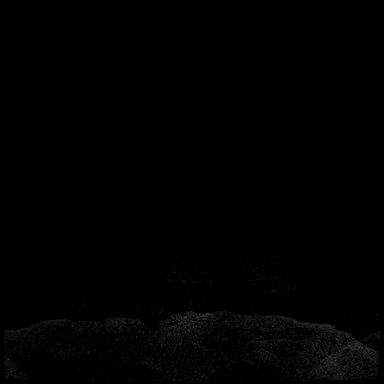

[Series 4: fl3d pre-cm · axial · non-contrast · 1.2mm · 0.78mm/px · z∈[-62,+110]mm · 5 of 144 slices shown]
[im 1/144]
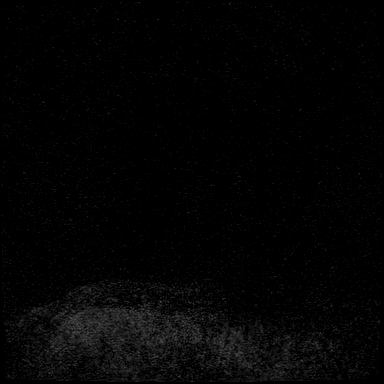
[im 36/144]
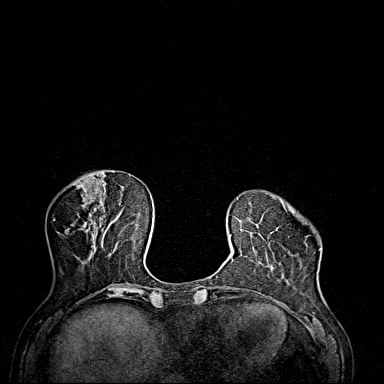
[im 72/144]
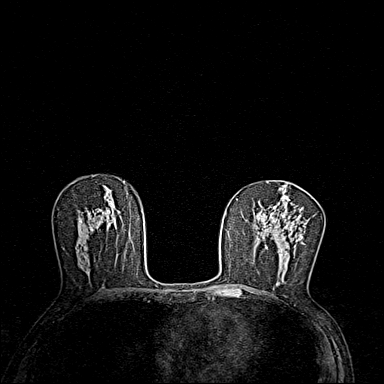
[im 108/144]
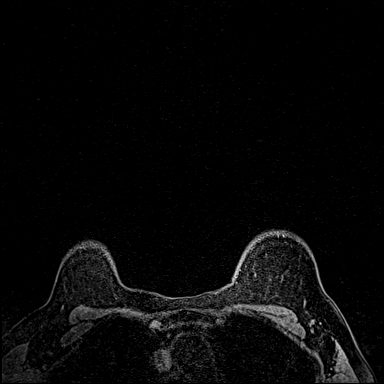
[im 144/144]
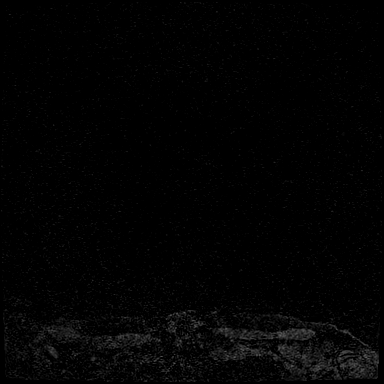

[Series 5: fl3d post immediate · axial · 1.2mm · 0.78mm/px · z∈[-62,+110]mm · 5 of 144 slices shown (1 of 3)]
[im 1/144]
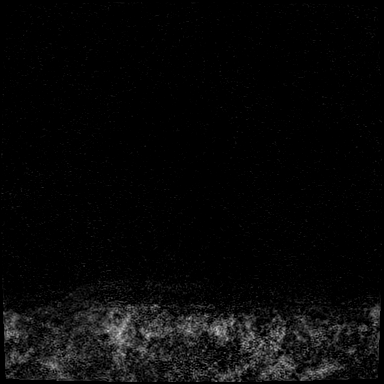
[im 36/144]
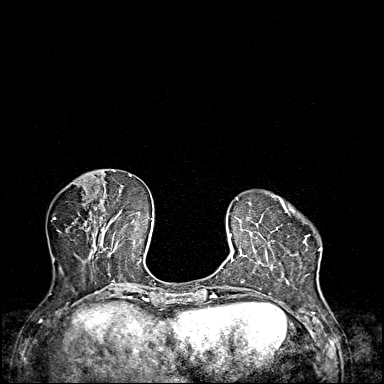
[im 72/144]
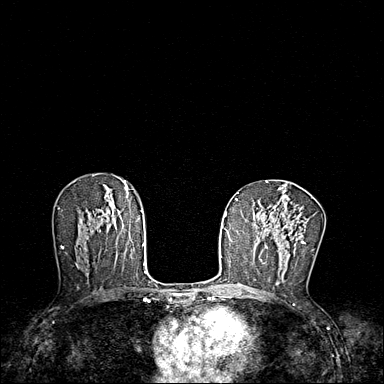
[im 108/144]
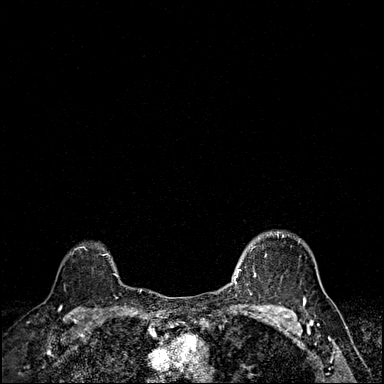
[im 144/144]
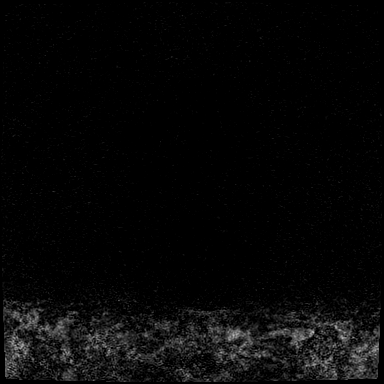

[Series 6: fl3d post immediate · axial · 1.2mm · 0.78mm/px · z∈[-62,+110]mm · 5 of 144 slices shown (2 of 3)]
[im 1/144]
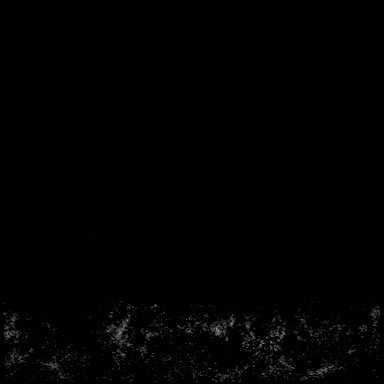
[im 36/144]
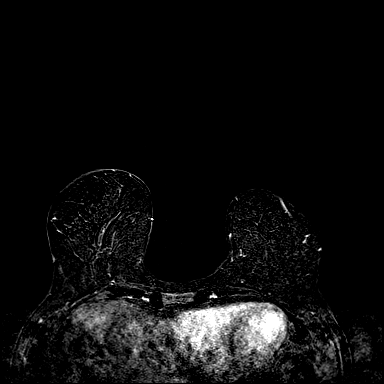
[im 72/144]
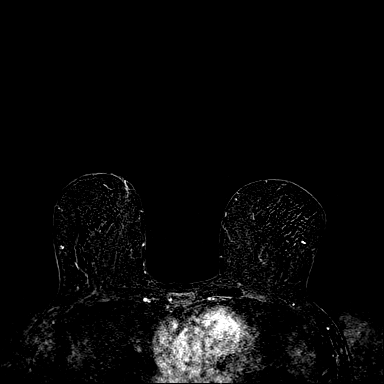
[im 108/144]
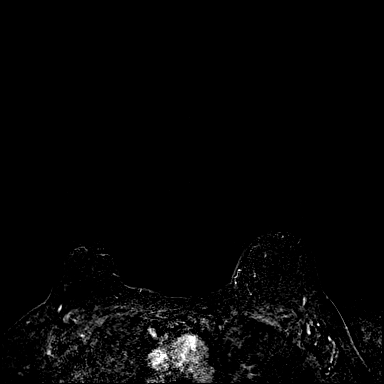
[im 144/144]
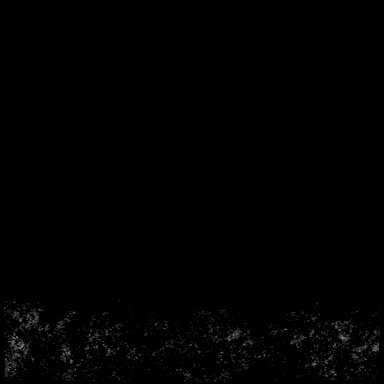

[Series 7: fl3d post immediate · axial · 172.8mm · 0.78mm/px · 1 of 1 slices shown (3 of 3)]
[im 1/1]
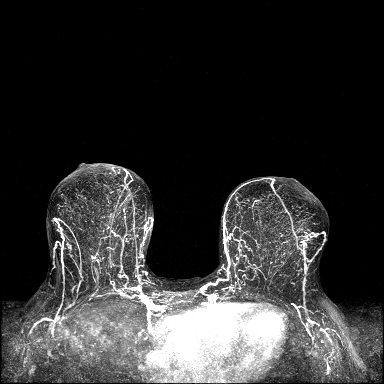

[Series 8: fl3d post 3min · axial · 1.2mm · 0.78mm/px · z∈[-62,+110]mm · 6 of 144 slices shown]
[im 1/144]
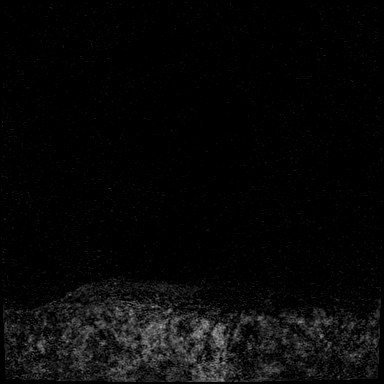
[im 29/144]
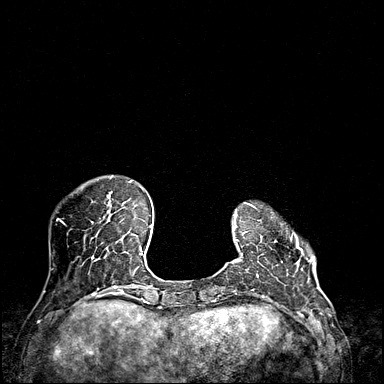
[im 58/144]
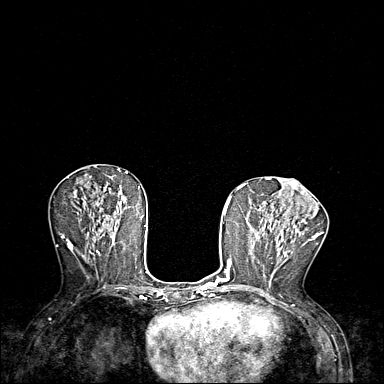
[im 86/144]
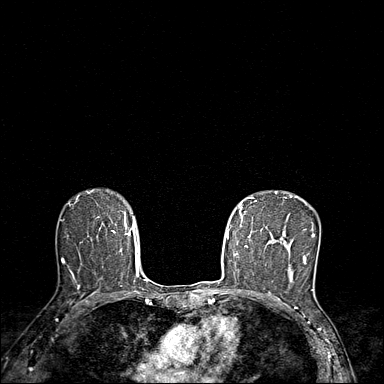
[im 115/144]
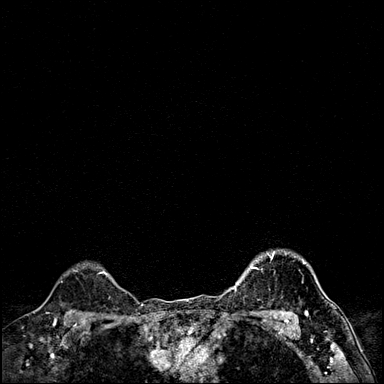
[im 144/144]
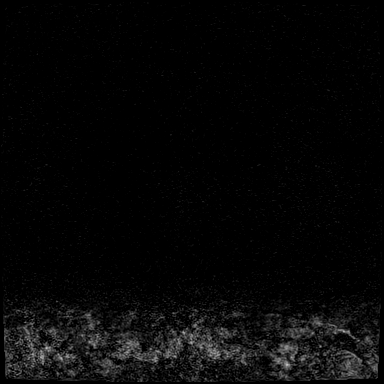

[Series 9: fl3d post 3min_sub · axial · 1.2mm · 0.78mm/px · z∈[-62,+110]mm · 6 of 144 slices shown]
[im 1/144]
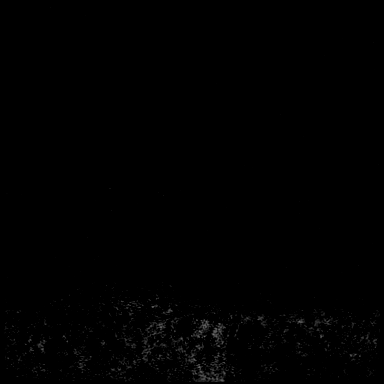
[im 29/144]
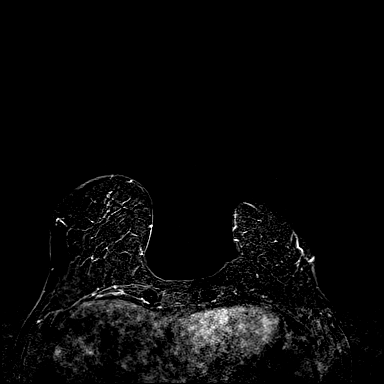
[im 58/144]
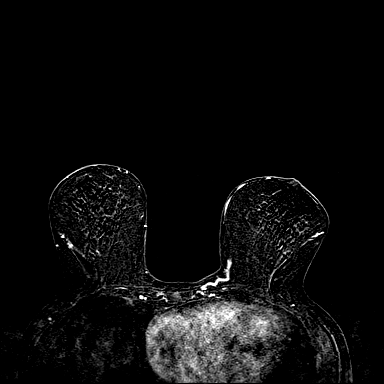
[im 86/144]
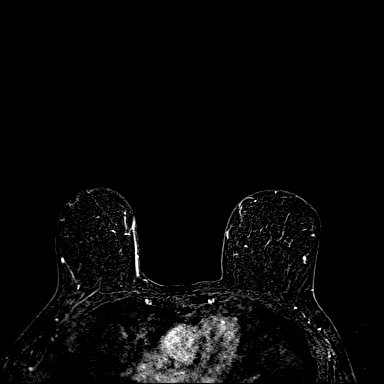
[im 115/144]
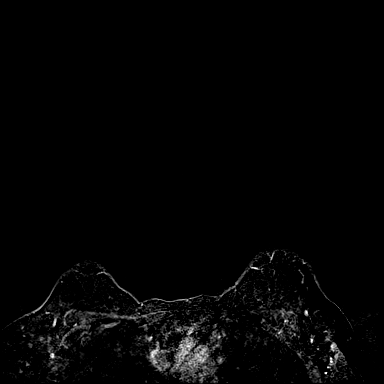
[im 144/144]
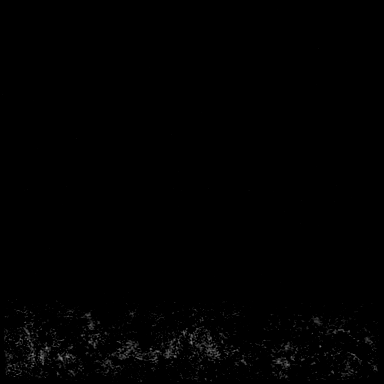

[34 of 48 positions shown; findings below may reference images not displayed]

Three-dimensional MR images were rendered by post-processing of the
original MR data on an independent workstation. The
three-dimensional MR images were interpreted, and findings are
reported in the following complete MRI report for this study. Three
dimensional images were evaluated at the independent interpreting
workstation using the DynaCAD thin client.
FINDINGS: Breast composition: c. Heterogeneous fibroglandular tissue.

Background parenchymal enhancement: Mild

Right breast: No mass or abnormal enhancement.

Left breast: No mass or abnormal enhancement.

Lymph nodes: No abnormal appearing lymph nodes.

Ancillary findings:  None.
IMPRESSION: No abnormal enhancement in either breast.

RECOMMENDATION:
Bilateral screening mammogram in Tuesday October, 2022 is recommended.

BI-RADS CATEGORY  1: Negative.

## 2022-05-11 DIAGNOSIS — Z23 Encounter for immunization: Secondary | ICD-10-CM | POA: Diagnosis not present

## 2022-05-26 ENCOUNTER — Encounter: Payer: Self-pay | Admitting: Nurse Practitioner

## 2022-06-15 ENCOUNTER — Telehealth: Payer: Self-pay | Admitting: Family Medicine

## 2022-06-15 MED ORDER — CYCLOBENZAPRINE HCL 5 MG PO TABS: 5.0000 mg | ORAL_TABLET | Freq: Two times a day (BID) | ORAL | 3 refills | Status: DC | PRN

## 2022-06-15 NOTE — Telephone Encounter (Signed)
Pt called stating that the cyclobenzaprine (FLEXERIL) 5 MG tablet is really working for her and she is taking it BID and would like for a Rx to be sent in to the [facility] in [place] with enough to continue this amount. Please advise.

## 2022-06-15 NOTE — Telephone Encounter (Signed)
Called and spoke with pt. Advised AL,NP approved increased dose and sent in rx. She verbalized understanding and appreciation.

## 2022-07-01 ENCOUNTER — Encounter: Payer: Self-pay | Admitting: Nurse Practitioner

## 2022-07-01 ENCOUNTER — Ambulatory Visit (INDEPENDENT_AMBULATORY_CARE_PROVIDER_SITE_OTHER): Payer: Medicare Other | Admitting: Nurse Practitioner

## 2022-07-01 VITALS — BP 116/64 | HR 105 | Ht 62.0 in | Wt 104.2 lb

## 2022-07-01 DIAGNOSIS — K5900 Constipation, unspecified: Secondary | ICD-10-CM

## 2022-07-01 DIAGNOSIS — D649 Anemia, unspecified: Secondary | ICD-10-CM | POA: Diagnosis not present

## 2022-07-01 DIAGNOSIS — D509 Iron deficiency anemia, unspecified: Secondary | ICD-10-CM

## 2022-07-01 DIAGNOSIS — R1310 Dysphagia, unspecified: Secondary | ICD-10-CM

## 2022-07-01 MED ORDER — OMEPRAZOLE 40 MG PO CPDR: 40.0000 mg | DELAYED_RELEASE_CAPSULE | Freq: Two times a day (BID) | ORAL | 1 refills | Status: DC

## 2022-07-01 NOTE — Progress Notes (Signed)
Agree with assessment and plan as outlined.  

## 2022-07-01 NOTE — Patient Instructions (Signed)
You have been scheduled for an endoscopy. Please follow written instructions given to you at your visit today. If you use inhalers (even only as needed), please bring them with you on the day of your procedure.   We have scheduled your follow up with Colleen for 08/31/22 at 2:00 pm  Miralax- every night as needed  Due to recent changes in healthcare laws, you may see the results of your imaging and laboratory studies on MyChart before your provider has had a chance to review them.  We understand that in some cases there may be results that are confusing or concerning to you. Not all laboratory results come back in the same time frame and the provider may be waiting for multiple results in order to interpret others.  Please give [place] 48 hours in order for your provider to thoroughly review all the results before contacting the office for clarification of your results.    Thank you for trusting me with your gastrointestinal care!   Carl Best, CRNP

## 2022-07-01 NOTE — Progress Notes (Signed)
07/01/2022 Teresa Lozano [id number] Feb 02, 1951   CHIEF COMPLAINT: Difficulty swallowing  HISTORY OF PRESENT ILLNESS: Teresa Lozano is a 71 year old female with a past medical history of hypertension, hyperlipidemia, kidney stones, osteoporosis, arthritis, stones, CKD stage 3a, colon polyps, GERD and PUD with severe gastric stenosis s/p distal gastrectomy with Billroth II reconstruction with vagotomy in 2018. She presents today for further evaluation regarding dysphagia.  She complains of having difficulty swallowing pills and food which gets stuck to the mid esophagus which passes after she takes a few sips of water or waits a few minutes which occurs several times weekly for the past few months.  Sometimes, pills come back up then go back down.  Last night, she swallowed bread which became stuck to the midesophagus for 1 or 2 minutes then spontaneously passed.  She has heartburn upon awakening in the early morning for the past few weeks.  She takes Omeprazole '40mg'$  once daily.  Her most recent EGD 12/28/2016 which showed severe gastric stenosis secondary to PUD/NSAID use which required distal gastrectomy with Billroth II. Her sister died from esophageal cancer the age of 3.  She has intermittent constipation and takes MiraLAX as needed.  She takes MiraLAX daily she develops uncontrolled bowel movements.  No rectal bleeding or black stools.  No NSAID use.      Latest Ref Rng & Units 07/24/2021    4:52 AM 03/20/2020   10:21 AM 04/05/2019   10:36 AM  CBC  WBC 4.0 - 10.5 K/uL 13.8  11.3  6.6   Hemoglobin 12.0 - 15.0 g/dL 12.0  15.4  14.6   Hematocrit 36.0 - 46.0 % 38.1  47.6  44.6   Platelets 150 - 400 K/uL 359  245  274.0        Latest Ref Rng & Units 07/24/2021    4:52 AM 03/20/2020   10:21 AM 04/05/2019   10:36 AM  CMP  Glucose 70 - 99 mg/dL 166  211  78   BUN 8 - 23 mg/dL '17  29  16   '$ Creatinine 0.44 - 1.00 mg/dL 1.10  1.34  1.29   Sodium 135 - 145 mmol/L 141  139  140   Potassium  3.5 - 5.1 mmol/L 4.3  4.4  4.7   Chloride 98 - 111 mmol/L 106  102  102   CO2 22 - 32 mmol/L 25  25  34   Calcium 8.9 - 10.3 mg/dL 8.8  8.9  9.7   Total Protein 6.5 - 8.1 g/dL 5.9  6.2  6.2   Total Bilirubin 0.3 - 1.2 mg/dL 0.5  0.5  0.3   Alkaline Phos 38 - 126 U/L 99  234  96   AST 15 - 41 U/L 29  36  20   ALT 0 - 44 U/L 22  49  18     Labs reviewed in care everywhere 03/03/2022: WBC 5.2.  Hemoglobin 10.2.  Hematocrit 33.8.  MCV 81.3.  Platelet 593.  BUN 15.  Creatinine 0.88.  PAST GI PROCEDURES:  EGD 11/09/2016: Esophagogastric landmarks identified. - Normal esophagus - Some residual food noted in the stomach without fluid. - Severe gastric stenosis was found at the pylorus. Biopsied.  EGD 12/28/2016: - Esophagogastric landmarks identified. - Normal esophagus - Residual food in the proximal stomach. - Severe benign appearing gastric stenosis was found at the pylorus. Biopsied. Given the severity of the stenosis and angulated lumen noted on barium study, this lesion is  high risk for balloon dilation and was not attempted. - Biopsies were taken with a cold forceps for Helicobacter pylori testing.  Colonoscopy 12/13/2018: - Preparation of the colon was poor. - Stool in the entire examined colon. - Procedure aborted  Colonoscopy 01/18/2019: - One 2 mm polyp in the sigmoid colon, removed with a cold biopsy forceps. Resected and retrieved. - Diverticulosis in the sigmoid colon. - Tortuous colon. - The examination was otherwise normal. - 10 year recall colonoscopy  Surgical [P], sigmoid, polyp - BENIGN COLONIC MUCOSA (1 OF 1 FRAGMENTS) - NO HIGH GRADE DYSPLASIA OR MALIGNANCY IDENTIFIED+   Past Medical History:  Diagnosis Date   Allergy    Arthritis    Chicken pox    Frequent headaches    GERD (gastroesophageal reflux disease)    History of kidney stones    Hyperlipidemia    Hypertension    Osteoporosis    Past Surgical History:  Procedure Laterality Date   CERVICAL  FUSION  2000   COLONOSCOPY     ESOPHAGOGASTRODUODENOSCOPY  11/2016   ESOPHAGOGASTRODUODENOSCOPY (EGD) WITH PROPOFOL N/A 12/28/2016   Procedure: ESOPHAGOGASTRODUODENOSCOPY (EGD) WITH PROPOFOL;  Surgeon: Manus Gunning, MD;  Location: [facility];  Service: Gastroenterology;  Laterality: N/A;   EYE SURGERY     STOMACH SURGERY  04/2017   REMOVED ULCER   TONSILLECTOMY     UPPER GASTROINTESTINAL ENDOSCOPY     Social History: She is widowed.  She has 1 son.  She is retired.  Non-smoker.  No alcohol use.  No drug use.  Family History: Sister died from esophageal cancer.  Father had heart disease and prostate cancer.  Brother with heart disease.  No known family history of colorectal cancer.   Allergies  Allergen Reactions   Robaxin [Methocarbamol] Nausea And Vomiting   Lisinopril Cough      Outpatient Encounter Medications as of 07/01/2022  Medication Sig   amLODipine (NORVASC) 10 MG tablet Take 10 mg by mouth daily.   butalbital-acetaminophen-caffeine (FIORICET) 50-325-40 MG tablet One to two tablets by mouth as needed for headache. No more than 4/week   cetirizine (ZYRTEC) 10 MG tablet Take 10 mg by mouth daily.   CRANBERRY PO Take 2 capsules by mouth daily.   cyclobenzaprine (FLEXERIL) 5 MG tablet Take 1 tablet (5 mg total) by mouth 2 (two) times daily as needed for muscle spasms.   imipramine (TOFRANIL) 25 MG tablet Take 3 tablets (75 mg total) by mouth at bedtime.   levETIRAcetam (KEPPRA) 500 MG tablet Take 1 tablet (500 mg total) by mouth 2 (two) times daily.   Magnesium 500 MG CAPS Take 500 mg by mouth 2 (two) times a week.    Multiple Vitamin (MULTI-VITAMIN DAILY PO) Take 1 tablet by mouth daily.   omeprazole (PRILOSEC) 40 MG capsule Take 1 capsule (40 mg total) by mouth daily.   ondansetron (ZOFRAN) 4 MG tablet Take 1 tablet (4 mg total) by mouth every 8 (eight) hours as needed.   ondansetron (ZOFRAN-ODT) 4 MG disintegrating tablet Take 1 tablet (4 mg total) by mouth  every 8 (eight) hours as needed for nausea or vomiting.   polyethylene glycol (MIRALAX / GLYCOLAX) packet Take 17 g by mouth daily as needed for mild constipation.   rosuvastatin (CRESTOR) 10 MG tablet Take by mouth.   SUMAtriptan (IMITREX) 100 MG tablet TAKE 1 TAB AS NEEDED FOR MIGRAINE. MAY REPEAT IN 2 HRS IF HEADACHE PERSISTS OR RECURS. DNF 6/19   VITAMIN D PO Take 5,000 Units  by mouth daily.   gabapentin (NEURONTIN) 300 MG capsule Take 1 capsule (300 mg total) by mouth at bedtime. (Patient not taking: Reported on 07/01/2022)   tamsulosin (FLOMAX) 0.4 MG CAPS capsule Take 1 capsule (0.4 mg total) by mouth daily. (Patient not taking: Reported on 07/01/2022)   Facility-Administered Encounter Medications as of 07/01/2022  Medication   0.9 %  sodium chloride infusion   REVIEW OF SYSTEMS:  Gen: + Fatigue. Denies fever, sweats or chills. No weight loss.  CV: + Leg swelling. Denies chest pain or palpitations. Resp: Denies cough, shortness of breath of hemoptysis.  GI: See HPI. GU : Denies urinary burning, blood in urine, increased urinary frequency or incontinence. MS: + Arthritis and back pain.  Derm: Denies rash, itchiness, skin lesions or unhealing ulcers. Psych: Denies depression, anxiety or memory loss.  Heme: Denies bruising, easy bleeding. Neuro:  Denies headaches, dizziness or paresthesias. Endo:  Denies any problems with DM, thyroid or adrenal function.  PHYSICAL EXAM: BP 116/64   Pulse (!) 105   Ht '5\' 2"'$  (1.575 m)   Wt 104 lb 4 oz (47.3 kg)   BMI 19.07 kg/m  General: 71 year old female in no acute distress. Head: Normocephalic and atraumatic. Eyes:  Sclerae non-icteric, conjunctive pink. Ears: Normal auditory acuity. Mouth: Dentition intact. No ulcers or lesions.  Neck: Supple, no lymphadenopathy or thyromegaly.  Lungs: Clear bilaterally to auscultation without wheezes, crackles or rhonchi. Heart: Regular rate and rhythm. No murmur, rub or gallop appreciated.  Abdomen:  Soft, nondistended.  Area of fullness palpated to the right mid abdomen nontender. No hepatosplenomegaly. Normoactive bowel sounds x 4 quadrants.  Rectal: Deferred. Musculoskeletal: Symmetrical with no gross deformities. Skin: Warm and dry. No rash or lesions on visible extremities. Extremities: No edema. Neurological: Alert oriented x 4, no focal deficits.  Psychological:  Alert and cooperative. Normal mood and affect.  ASSESSMENT AND PLAN:  63) 71 year old female with a history of GERD/PUD with severe gastric stenosis s/p distal gastrectomy with Billroth II reconstruction with vagotomy in 2018 presents with dysphagia x 4 to 6 months and GERD symptoms x 2 weeks.  Sister died from esophageal cancer. -EGD possible esophageal dilatation benefits and risks discussed including risk with sedation, risk of bleeding, perforation and infection  -Omeprazole 40 mg 1 p.o. twice daily to be taken 30 minutes before breakfast and dinner -Patient instructed to take 3 small sips of water prior to swallowing any food or pills, avoid large pieces of meat, bread and rice. -Patient to contact office if symptoms worsen  2) Normocytic anemia. Hg 12 on 07/24/2021 per labs reviewed in EP[facility]. Following patient's appointment today, I further reviewed records in care everywhere dated 03/03/2022 which showed a hemoglobin level of 10.2.  Hematocrit 33.8 and MCV 81.3. Anemia likely due to hypertensive CKD, however, I will check iron and B12 levels to rule out iron deficiency/B12 deficiency anemia. -Patient was contacted to have laboratory studies done 07/02/2022 to include CBC, CMP, IBC + ferritin, B12 and folate level  3) Constipation. Mild fullness without a discrete mass palpated to the right mid abdomen -Patient to follow-up in office for repeat abdominal exam 2 weeks after EGD completed -MiraLAX as needed to increase stool output -Consider future CTAP if repeat abdominal exam remains abnormal  4) Colon cancer screening.   Colonoscopy 01/2019 identified one 2 mm polyp removed from the sigmoid colon, biopsies confirmed benign colonic mucosa. -Next colonoscopy due 01/2029       CC:  Tisovec, Fransico Him,  MD

## 2022-07-02 ENCOUNTER — Other Ambulatory Visit (INDEPENDENT_AMBULATORY_CARE_PROVIDER_SITE_OTHER): Payer: Medicare Other

## 2022-07-02 DIAGNOSIS — D649 Anemia, unspecified: Secondary | ICD-10-CM | POA: Diagnosis not present

## 2022-07-02 LAB — CBC
HCT: 30 % — ABNORMAL LOW (ref 36.0–46.0)
Hemoglobin: 9.3 g/dL — ABNORMAL LOW (ref 12.0–15.0)
MCHC: 31 g/dL (ref 30.0–36.0)
MCV: 72.3 fl — ABNORMAL LOW (ref 78.0–100.0)
Platelets: 412 10*3/uL — ABNORMAL HIGH (ref 150.0–400.0)
RBC: 4.15 Mil/uL (ref 3.87–5.11)
RDW: 19.5 % — ABNORMAL HIGH (ref 11.5–15.5)
WBC: 5.2 10*3/uL (ref 4.0–10.5)

## 2022-07-02 LAB — IBC + FERRITIN
Ferritin: 2.4 ng/mL — ABNORMAL LOW (ref 10.0–291.0)
Iron: 24 ug/dL — ABNORMAL LOW (ref 42–145)
Saturation Ratios: 4.7 % — ABNORMAL LOW (ref 20.0–50.0)
TIBC: 505.4 ug/dL — ABNORMAL HIGH (ref 250.0–450.0)
Transferrin: 361 mg/dL — ABNORMAL HIGH (ref 212.0–360.0)

## 2022-07-02 LAB — COMPREHENSIVE METABOLIC PANEL
ALT: 15 U/L (ref 0–35)
AST: 19 U/L (ref 0–37)
Albumin: 4.2 g/dL (ref 3.5–5.2)
Alkaline Phosphatase: 74 U/L (ref 39–117)
BUN: 17 mg/dL (ref 6–23)
CO2: 26 mEq/L (ref 19–32)
Calcium: 9.1 mg/dL (ref 8.4–10.5)
Chloride: 105 mEq/L (ref 96–112)
Creatinine, Ser: 0.89 mg/dL (ref 0.40–1.20)
GFR: 65.78 mL/min (ref >60.00)
Glucose, Bld: 101 mg/dL — ABNORMAL HIGH (ref 70–99)
Potassium: 3.5 mEq/L (ref 3.5–5.1)
Sodium: 140 mEq/L (ref 135–145)
Total Bilirubin: 0.2 mg/dL (ref 0.2–1.2)
Total Protein: 6.5 g/dL (ref 6.0–8.3)

## 2022-07-02 LAB — B12 AND FOLATE PANEL
Folate: >23.8 ng/mL (ref >5.9)
Vitamin B-12: 703 pg/mL (ref 211–911)

## 2022-07-06 ENCOUNTER — Other Ambulatory Visit: Payer: Self-pay

## 2022-07-06 ENCOUNTER — Telehealth: Payer: Self-pay

## 2022-07-06 ENCOUNTER — Encounter: Payer: Self-pay | Admitting: Gastroenterology

## 2022-07-06 DIAGNOSIS — D509 Iron deficiency anemia, unspecified: Secondary | ICD-10-CM

## 2022-07-06 NOTE — Addendum Note (Signed)
Addended by: Yevette Edwards on: 07/06/2022 01:10 PM   Modules accepted: Orders

## 2022-07-06 NOTE — Telephone Encounter (Signed)
Jan thank you very much for helping to coordinate this

## 2022-07-06 NOTE — Addendum Note (Signed)
Addended by: Yevette Edwards on: 07/06/2022 01:05 PM   Modules accepted: Orders

## 2022-07-06 NOTE — Telephone Encounter (Signed)
Offered patient an opening this Thursday, 12-21 at 10:30 am. Patient called back and accepted. She understands to be here at 9:30 am, clear liquids only until 7:30 am and she must have a driver to stay and drive her home.  Procedure rescheduled.

## 2022-07-06 NOTE — Progress Notes (Signed)
Error

## 2022-07-07 ENCOUNTER — Telehealth: Payer: Self-pay | Admitting: Pharmacy Technician

## 2022-07-07 NOTE — Telephone Encounter (Signed)
Dr. Havery Moros, Juluis Rainier note:  Auth Submission: NO AUTH NEEDED Payer: medicare [employer] [place] Medication & CPT/J Code(s) submitted: Feraheme (ferumoxytol) [id number] Route of submission (phone, fax, portal):  Phone # Fax # Auth type: Buy/Bill Units/visits requested: 2 Reference number:  Approval from: 07/07/22 to 07/08/23  Patient will be scheduled as soon as possible.

## 2022-07-07 NOTE — Telephone Encounter (Signed)
Great, thank you!

## 2022-07-08 ENCOUNTER — Encounter: Payer: Self-pay | Admitting: Gastroenterology

## 2022-07-08 ENCOUNTER — Ambulatory Visit (AMBULATORY_SURGERY_CENTER): Payer: Medicare Other | Admitting: Gastroenterology

## 2022-07-08 ENCOUNTER — Telehealth: Payer: Self-pay

## 2022-07-08 VITALS — BP 107/57 | HR 90 | Temp 97.8°F | Resp 12 | Ht 62.0 in | Wt 104.0 lb

## 2022-07-08 DIAGNOSIS — D509 Iron deficiency anemia, unspecified: Secondary | ICD-10-CM

## 2022-07-08 DIAGNOSIS — R1310 Dysphagia, unspecified: Secondary | ICD-10-CM | POA: Diagnosis not present

## 2022-07-08 DIAGNOSIS — K222 Esophageal obstruction: Secondary | ICD-10-CM | POA: Diagnosis not present

## 2022-07-08 DIAGNOSIS — Z980 Intestinal bypass and anastomosis status: Secondary | ICD-10-CM

## 2022-07-08 MED ORDER — SUCRALFATE 1 GM/10ML PO SUSP: 1.0000 g | Freq: Three times a day (TID) | ORAL | 1 refills | Status: DC

## 2022-07-08 MED ORDER — SODIUM CHLORIDE 0.9 % IV SOLN: 500.0000 mL | Freq: Once | INTRAVENOUS | Status: DC

## 2022-07-08 MED ORDER — METOCLOPRAMIDE HCL 5 MG PO TABS: 5.0000 mg | ORAL_TABLET | Freq: Three times a day (TID) | ORAL | 1 refills | Status: DC | PRN

## 2022-07-08 NOTE — Telephone Encounter (Signed)
-----   Message from Teresa Flock, MD sent at 07/08/2022 12:12 PM EST ----- Regarding: upper GI series [name] can you help order this patient an upper GI series please? History of billroth II surgery.  She also needs a repeat EGD scheduled sometime in mid January, will need to look at schedule, may need to add to 730 slot somewhere.   Thanks

## 2022-07-08 NOTE — Progress Notes (Signed)
History and Physical Interval Note: Patient seen 12/14 for dysphagia. Noted to have severe IDA. History of gastric outlet obstruction s/p Bilroth II. EGD to further evaluate dysphagia and IDA. On omeprazole '40mg'$  BID. Following discussion of risks / benefits she wishes to proceed. Otherwise no interval changes.  07/08/2022 10:50 AM  Teresa Lozano  has presented today for endoscopic procedure(s), with the diagnosis of  Encounter Diagnoses  Name Primary?   Iron deficiency anemia, unspecified iron deficiency anemia type Yes   Dysphagia, unspecified type   .  The various methods of evaluation and treatment have been discussed with the patient and/or family. After consideration of risks, benefits and other options for treatment, the patient has consented to  the endoscopic procedure(s).   The patient's history has been reviewed, patient examined, no change in status, stable for surgery.  I have reviewed the patient's chart and labs.  Questions were answered to the patient's satisfaction.    Jolly Mango, MD [facility]

## 2022-07-08 NOTE — Telephone Encounter (Signed)
Upper GI series order in epic. Secure staff message sent to radiology scheduling to contact patient to set up appt.  Discussing possible date for EGD in the [place] with Dr. Havery Moros.

## 2022-07-08 NOTE — Progress Notes (Signed)
Pt's states no medical or surgical changes since previsit or office visit. 

## 2022-07-08 NOTE — Progress Notes (Signed)
[facility]SS, transported to PACU °

## 2022-07-08 NOTE — Patient Instructions (Signed)
YOU HAD AN ENDOSCOPIC PROCEDURE TODAY AT [facility]:   Refer to the procedure report that was given to you for any specific questions about what was found during the examination.  If the procedure report does not answer your questions, please call your gastroenterologist to clarify.  If you requested that your care partner not be given the details of your procedure findings, then the procedure report has been included in a sealed envelope for you to review at your convenience later.  YOU SHOULD EXPECT: Some feelings of bloating in the abdomen. Passage of more gas than usual.  Walking can help get rid of the air that was put into your GI tract during the procedure and reduce the bloating. If you had a lower endoscopy (such as a colonoscopy or flexible sigmoidoscopy) you may notice spotting of blood in your stool or on the toilet paper. If you underwent a bowel prep for your procedure, you may not have a normal bowel movement for a few days.  Please Note:  You might notice some irritation and congestion in your nose or some drainage.  This is from the oxygen used during your procedure.  There is no need for concern and it should clear up in a day or so.  SYMPTOMS TO REPORT IMMEDIATELY: Following upper endoscopy (EGD)  Vomiting of blood or coffee ground material  New chest pain or pain under the shoulder blades  Painful or persistently difficult swallowing  New shortness of breath  Fever of 100F or higher  Black, tarry-looking stools  For urgent or emergent issues, a gastroenterologist can be reached at any hour by calling [phone]. Do not use MyChart messaging for urgent concerns.    DIET:  Post dilation diet today.  Drink plenty of fluids but you should avoid alcoholic beverages for 24 hours.  ACTIVITY:  You should plan to take it easy for the rest of today and you should NOT DRIVE or use heavy machinery until tomorrow (because of the sedation medicines used during the  test).    FOLLOW UP: Our staff will call the number listed on your records the next business day following your procedure.  We will call around 7:15- 8:00 am to check on you and address any questions or concerns that you may have regarding the information given to you following your procedure. If we do not reach you, we will leave a message.     If any biopsies were taken you will be contacted by phone or by letter within the next 1-3 weeks.  Please call us at [phone] if you have not heard about the biopsies in 3 weeks.    SIGNATURES/CONFIDENTIALITY: You and/or your care partner have signed paperwork which will be entered into your electronic medical record.  These signatures attest to the fact that that the information above on your After Visit Summary has been reviewed and is understood.  Full responsibility of the confidentiality of this discharge information lies with you and/or your care-partner.

## 2022-07-08 NOTE — Op Note (Signed)
[facility] Patient Name: Pauline Pegues Procedure Date: 07/08/2022 10:50 AM MRN: [id number] Endoscopist: Remo Lipps P. Havery Moros , MD, [id number] Age: 71 Referring MD:  Date of Birth: August 07, 1950 Gender: Female Account #: [id number] Procedure:                Upper GI endoscopy Indications:              Iron deficiency anemia, Dysphagia - history of                            gastric outlet obstruction s/p Billroth II with                            vagotomy in 2018 - no EGD since surgery, on                            omeprazole Medicines:                Monitored Anesthesia Care Procedure:                Pre-Anesthesia Assessment:                           - Prior to the procedure, a History and Physical                            was performed, and patient medications and                            allergies were reviewed. The patient's tolerance of                            previous anesthesia was also reviewed. The risks                            and benefits of the procedure and the sedation                            options and risks were discussed with the patient.                            All questions were answered, and informed consent                            was obtained. Prior Anticoagulants: The patient has                            taken no anticoagulant or antiplatelet agents. ASA                            Grade Assessment: II - A patient with mild systemic                            disease. After reviewing the risks and benefits,  the patient was deemed in satisfactory condition to                            undergo the procedure.                           After obtaining informed consent, the endoscope was                            passed under direct vision. Throughout the                            procedure, the patient's blood pressure, pulse, and                            oxygen saturations were monitored  continuously. The                            Endoscope was introduced through the mouth, and                            advanced to the jejunum. The upper GI endoscopy was                            accomplished without difficulty. The patient                            tolerated the procedure well. Scope In: Scope Out: Findings:                 One benign-appearing, intrinsic severe stenosis was                            found with the GEJ - it measured perhaps 4-36m in                            diameter. With mild resistance, the stenosis was                            traversed with the upper endoscope, causing                            appropriate mucosal wrents at the GEJ.                           The exam of the esophagus was otherwise normal.                           A large amount of food (residue) was found in the                            gastric body / remant. This prohibited views of the  stomach and not much of the stomach was seen.                           I was able to quickly look at part of the surgical                            anastomosis. Evidence of a Billroth II                            gastrojejunostomy was found. The gastrojejunal                            anastomosis was only partially seen, but parent,                            and characterized by friable mucosa with contact                            with the upper endoscopy. This was traversed.                           The examined small bowel limb was entered and                            obscured with food remnant, visualization was poor                            and given gastric findings, full small bowel                            evaluation not performed. Complications:            No immediate complications. Estimated blood loss:                            Minimal. Estimated Blood Loss:     Estimated blood loss was minimal. Impression:               - Severe  benign-appearing esophageal stenosis at                            the GEJ dilated with upper endoscope itself.                           - Normal esophagus otherwise                           - A large amount of food (residue) in the stomach.                            This prohibited much of any evaluation of the                            stomach but was able to see that at least part of  the surgical anastomosis and it was patent, but                            quite friable.                           - Minimally evaluated small bowel limb - not well                            seen due to food debrise                           Overall, limited exam. Severe benign GEJ stricture                            is causing dysphagia, dilated with upper endoscope                            but retroflexed views of the stomach not possible                            due to residual food, could not clear that area.                           Suspect IDA could be coming from surgical                            anastomosis, was quite friable and poorly                            visualized but patent. Suspect dysmotility perhaps                            related to vagotomy / surgery is causing delayed                            gastric emptying, but need to rule out other                            pathology Recommendation:           - Patient has a contact number available for                            emergencies. The signs and symptoms of potential                            delayed complications were discussed with the                            patient. Return to normal activities tomorrow.                            Written discharge instructions were provided to the  patient.                           - Post dilation diet - stay on full liquid or soft                            diet for now                           - Continue present  medications (omeprazole twice                            daily)                           - add Carafate tablet a few times per day (this                            will make stools dark)                           - recommend upper GI series with radiology - we                            will coordinate                           - can add Reglan '5mg'$  TID PRN if early satiety or                            nausea                           - repeat EGD in upcoming weeks, with liquid diet                            beforehand and Reglan, to reassess the surgical                            anastomosis and dilate stricture further. Will                            discuss with patient Carlota Raspberry. Edelmiro Innocent, MD 07/08/2022 11:20:24 AM This report has been signed electronically.

## 2022-07-09 ENCOUNTER — Telehealth: Payer: Self-pay

## 2022-07-09 NOTE — Telephone Encounter (Signed)
Called and spoke with patient regarding recommendations. Pt knows to expect a call from radiology scheduling to set up her Upper GI series appt. Pt has been scheduled for an EGD in the [facility] on Friday, 07-30-22 at 10 am. Pt is aware that she will need to arrive at 9 am with a care partner. I informed patient that I will get some instructions mailed to her. Pt verbalized understanding and had no concerns at the end of the call.  Dr. Havery Moros, please advise when you would like patient to start liquid diet and Reglan for EGD prep. Thanks  Ambulatory referral to GI in epic.

## 2022-07-09 NOTE — Telephone Encounter (Signed)
  Follow up Call-     07/08/2022   10:27 AM  Call back number  Post procedure Call Back phone  # [phone]  Permission to leave phone message Yes     Patient questions:  Do you have a fever, pain , or abdominal swelling? No. Pain Score  0 *  Have you tolerated food without any problems? Yes.    Have you been able to return to your normal activities? Yes.    Do you have any questions about your discharge instructions: Diet   No. Medications  No. Follow up visit  No.  Do you have questions or concerns about your Care? No.  Actions: * If pain score is 4 or above: No action needed, pain <4.

## 2022-07-09 NOTE — Telephone Encounter (Signed)
EGD instructions mailed and sent to patient via [facility].

## 2022-07-09 NOTE — Telephone Encounter (Signed)
Thank you [place]! Liquid diet day prior to the exam. Dose of Reglan night prior and AM of the procedure, at least 2 hours beforehand. Thanks

## 2022-07-13 ENCOUNTER — Other Ambulatory Visit: Payer: Self-pay

## 2022-07-13 ENCOUNTER — Ambulatory Visit (INDEPENDENT_AMBULATORY_CARE_PROVIDER_SITE_OTHER)
Admission: RE | Admit: 2022-07-13 | Discharge: 2022-07-13 | Disposition: A | Discharge status: Home / Self Care | Payer: Medicare Other | Source: Ambulatory Visit | Attending: Internal Medicine | Admitting: Internal Medicine

## 2022-07-13 ENCOUNTER — Telehealth: Payer: Self-pay | Admitting: Gastroenterology

## 2022-07-13 DIAGNOSIS — R1013 Epigastric pain: Secondary | ICD-10-CM

## 2022-07-13 DIAGNOSIS — R079 Chest pain, unspecified: Secondary | ICD-10-CM | POA: Diagnosis not present

## 2022-07-13 NOTE — Telephone Encounter (Signed)
Patient had EGD done 07/08/2022 and is still having a lot of pain, specifically below the sternum area. Please advise.

## 2022-07-13 NOTE — Telephone Encounter (Signed)
2 view PA and lat CXR today, order stat - rule out EGD complication, though felt unlikely Carafate was advised by Dr. Havery Moros but I would make this into a slurry or use the liquid version 1 gram 3 times daily before meals and at bedtime More recs after xray

## 2022-07-13 NOTE — Telephone Encounter (Signed)
[facility] pt had EGD done on 12/21 with stenosis dilated. Pt states she is having pain that starts in her sternum that goes under her breast around to her back on the left side. Nothing seems to help. She has been sitting on a heating pad and taking tylenol but it does not go away. Dr. Hilarie Fredrickson as DOD please advise.

## 2022-07-13 NOTE — Telephone Encounter (Signed)
Pt aware of Dr. Quentin Mulling recommendations. States she will come tomorrow most likely for xray as she lives in [place]. Reports she is taking liquid carafate already .

## 2022-07-14 ENCOUNTER — Ambulatory Visit (INDEPENDENT_AMBULATORY_CARE_PROVIDER_SITE_OTHER): Payer: Medicare Other

## 2022-07-14 VITALS — BP 120/73 | HR 78 | Temp 97.6°F | Resp 15

## 2022-07-14 DIAGNOSIS — D509 Iron deficiency anemia, unspecified: Secondary | ICD-10-CM

## 2022-07-14 MED ORDER — ACETAMINOPHEN 325 MG PO TABS
650.0000 mg | ORAL_TABLET | Freq: Once | ORAL | Status: AC
  Administered 2022-07-14: 650 mg via ORAL
  Filled 2022-07-14: qty 2

## 2022-07-14 MED ORDER — DIPHENHYDRAMINE HCL 25 MG PO CAPS
25.0000 mg | ORAL_CAPSULE | Freq: Once | ORAL | Status: AC
  Administered 2022-07-14: 25 mg via ORAL
  Filled 2022-07-14: qty 1

## 2022-07-14 MED ORDER — SODIUM CHLORIDE 0.9 % IV SOLN
510.0000 mg | Freq: Once | INTRAVENOUS | Status: AC
  Administered 2022-07-14: 510 mg via INTRAVENOUS
  Filled 2022-07-14: qty 17

## 2022-07-14 NOTE — Telephone Encounter (Signed)
Called and spoke with patient. We reviewed x-ray results. She states that she is doing a little better on the liquid Carafate, it was quite expensive for her so she is not sure how long she will take it. Pt has been advised to continue a liquid/soft diet for a couple of days. Pt has been advised that her symptoms should improve with time. Pt knows to contact us if symptoms do not continue to improve or worsen. Pt verbalized understanding and had no concerns at the end of the call.

## 2022-07-14 NOTE — Progress Notes (Signed)
Diagnosis: Iron Deficiency Anemia  Provider:  Marshell Garfinkel MD  Procedure: Infusion  IV Type: Peripheral, IV Location: L Antecubital  Feraheme (Ferumoxytol), Dose: 510 mg  Infusion Start Time: 1522  Infusion Stop Time: 1587  Post Infusion IV Care: Observation period completed, IV discontinued  Discharge: Condition: Good, Destination: Home . AVS provided to patient.   Performed by:  Gigi Gin, RN

## 2022-07-14 NOTE — Telephone Encounter (Signed)
Thanks Ulice Dash. Appreciate your help with this.  CXR looks okay, agree with the change in carafate to liquid formulation / slurry.  [place] can you call the patient and check on her? I suspect her symptoms are likely from mucosal wrents / ulceration at the site of dilation, hopefully carafate helps and this should continue to get better with time. She should stay on a liquid / soft diet if she isn't already doing that. Thanks

## 2022-07-14 NOTE — Telephone Encounter (Signed)
Inbound call from patient returning call.   Patient can be reached on her cell at [phone]  Please advise

## 2022-07-19 HISTORY — PX: UPPER GASTROINTESTINAL ENDOSCOPY: SHX188

## 2022-07-21 ENCOUNTER — Ambulatory Visit (INDEPENDENT_AMBULATORY_CARE_PROVIDER_SITE_OTHER): Payer: Medicare Other | Admitting: *Deleted

## 2022-07-21 VITALS — BP 106/67 | HR 82 | Temp 97.8°F | Resp 16 | Ht 62.0 in | Wt 103.2 lb

## 2022-07-21 DIAGNOSIS — D509 Iron deficiency anemia, unspecified: Secondary | ICD-10-CM | POA: Diagnosis not present

## 2022-07-21 MED ORDER — DIPHENHYDRAMINE HCL 25 MG PO CAPS: 25.0000 mg | ORAL_CAPSULE | Freq: Once | ORAL | Status: DC

## 2022-07-21 MED ORDER — ACETAMINOPHEN 325 MG PO TABS: 650.0000 mg | ORAL_TABLET | Freq: Once | ORAL | Status: DC

## 2022-07-21 MED ORDER — SODIUM CHLORIDE 0.9 % IV SOLN
510.0000 mg | Freq: Once | INTRAVENOUS | Status: AC
  Administered 2022-07-21: 510 mg via INTRAVENOUS
  Filled 2022-07-21: qty 17

## 2022-07-21 NOTE — Progress Notes (Signed)
Diagnosis: Iron Deficiency Anemia  Provider:  Marshell Garfinkel MD  Procedure: Infusion  IV Type: Peripheral, IV Location: R Antecubital  Feraheme (Ferumoxytol), Dose: 510 mg  Infusion Start Time: 4196 pm  Infusion Stop Time: 1518 pm  Post Infusion IV Care: Observation period completed and Peripheral IV Discontinued  Discharge: Condition: Good, Destination: Home . AVS provided to patient.   Performed by:  Oren Beckmann, RN

## 2022-07-26 ENCOUNTER — Ambulatory Visit (HOSPITAL_COMMUNITY)
Admission: RE | Admit: 2022-07-26 | Discharge: 2022-07-26 | Disposition: A | Discharge status: Home / Self Care | Payer: Medicare Other | Source: Ambulatory Visit | Attending: Gastroenterology | Admitting: Gastroenterology

## 2022-07-26 ENCOUNTER — Other Ambulatory Visit: Payer: Self-pay | Admitting: Gastroenterology

## 2022-07-26 DIAGNOSIS — R1310 Dysphagia, unspecified: Secondary | ICD-10-CM | POA: Insufficient documentation

## 2022-07-26 DIAGNOSIS — Z980 Intestinal bypass and anastomosis status: Secondary | ICD-10-CM | POA: Diagnosis not present

## 2022-07-26 DIAGNOSIS — D509 Iron deficiency anemia, unspecified: Secondary | ICD-10-CM

## 2022-07-26 NOTE — Progress Notes (Signed)
Diagnosis: Iron Deficiency Anemia  Provider:  Marshell Garfinkel MD  Procedure: Infusion  IV Type: Peripheral, IV Location: L Antecubital  Feraheme (Ferumoxytol), Dose: 510 mg  Infusion Start Time: 1522  Infusion Stop Time: 4320  Post Infusion IV Care: Observation period completed and Peripheral IV Discontinued  Discharge: Condition: Good, Destination: Home . AVS provided to patient.   Performed by:  Adelina Mings, LPN

## 2022-07-27 ENCOUNTER — Encounter: Payer: Self-pay | Admitting: Certified Registered Nurse Anesthetist

## 2022-07-30 ENCOUNTER — Encounter: Payer: Self-pay | Admitting: Gastroenterology

## 2022-07-30 ENCOUNTER — Other Ambulatory Visit (INDEPENDENT_AMBULATORY_CARE_PROVIDER_SITE_OTHER): Payer: Medicare Other

## 2022-07-30 ENCOUNTER — Ambulatory Visit (AMBULATORY_SURGERY_CENTER): Payer: Medicare Other | Admitting: Gastroenterology

## 2022-07-30 VITALS — BP 108/62 | HR 95 | Temp 97.7°F | Resp 14 | Ht 62.0 in | Wt 104.0 lb

## 2022-07-30 DIAGNOSIS — K2950 Unspecified chronic gastritis without bleeding: Secondary | ICD-10-CM

## 2022-07-30 DIAGNOSIS — D509 Iron deficiency anemia, unspecified: Secondary | ICD-10-CM

## 2022-07-30 DIAGNOSIS — K319 Disease of stomach and duodenum, unspecified: Secondary | ICD-10-CM

## 2022-07-30 DIAGNOSIS — I10 Essential (primary) hypertension: Secondary | ICD-10-CM | POA: Diagnosis not present

## 2022-07-30 DIAGNOSIS — K222 Esophageal obstruction: Secondary | ICD-10-CM

## 2022-07-30 DIAGNOSIS — E7800 Pure hypercholesterolemia, unspecified: Secondary | ICD-10-CM | POA: Diagnosis not present

## 2022-07-30 DIAGNOSIS — R1310 Dysphagia, unspecified: Secondary | ICD-10-CM

## 2022-07-30 LAB — CBC WITH DIFFERENTIAL/PLATELET
Basophils Absolute: 0 10*3/uL (ref 0.0–0.1)
Basophils Relative: 1 % (ref 0.0–3.0)
Eosinophils Absolute: 0.1 10*3/uL (ref 0.0–0.7)
Eosinophils Relative: 2.6 % (ref 0.0–5.0)
HCT: 34.7 % — ABNORMAL LOW (ref 36.0–46.0)
Hemoglobin: 10.8 g/dL — ABNORMAL LOW (ref 12.0–15.0)
Lymphocytes Relative: 33.4 % (ref 12.0–46.0)
Lymphs Abs: 1.1 10*3/uL (ref 0.7–4.0)
MCHC: 31.2 g/dL (ref 30.0–36.0)
MCV: 79.6 fl (ref 78.0–100.0)
Monocytes Absolute: 0.3 10*3/uL (ref 0.1–1.0)
Monocytes Relative: 8.2 % (ref 3.0–12.0)
Neutro Abs: 1.8 10*3/uL (ref 1.4–7.7)
Neutrophils Relative %: 54.8 % (ref 43.0–77.0)
Platelets: 228 10*3/uL (ref 150.0–400.0)
RBC: 4.36 Mil/uL (ref 3.87–5.11)
RDW: 30.4 % — ABNORMAL HIGH (ref 11.5–15.5)
WBC: 3.4 10*3/uL — ABNORMAL LOW (ref 4.0–10.5)

## 2022-07-30 MED ORDER — SUCRALFATE 1 G PO TABS: 1.0000 g | ORAL_TABLET | Freq: Four times a day (QID) | ORAL | 1 refills | Status: DC | PRN

## 2022-07-30 MED ORDER — SODIUM CHLORIDE 0.9 % IV SOLN: 500.0000 mL | Freq: Once | INTRAVENOUS | Status: DC

## 2022-07-30 NOTE — Op Note (Signed)
[facility] Patient Name: Teresa Lozano Procedure Date: 07/30/2022 9:55 AM MRN: [id number] Endoscopist: Remo Lipps P. Havery Moros , MD, [id number] Age: 72 Referring MD:  Date of Birth: 1950/10/03 Gender: Female Account #: [id number] Procedure:                Upper GI endoscopy Indications:              Iron deficiency anemia, Dysphagia - history of                            Bilroth II with vagotomy for refractory pyloric                            stricture, severe IDA - last EGD limited by                            retained food, patient has been on liquid diet                            since last night for this exam. Also with dysphagia                            and severe GEJ stricture dilated with the endoscope                            at the last exam Medicines:                Monitored Anesthesia Care Procedure:                Pre-Anesthesia Assessment:                           - Prior to the procedure, a History and Physical                            was performed, and patient medications and                            allergies were reviewed. The patient's tolerance of                            previous anesthesia was also reviewed. The risks                            and benefits of the procedure and the sedation                            options and risks were discussed with the patient.                            All questions were answered, and informed consent                            was obtained. Prior Anticoagulants: The patient has  taken no anticoagulant or antiplatelet agents. ASA                            Grade Assessment: II - A patient with mild systemic                            disease. After reviewing the risks and benefits,                            the patient was deemed in satisfactory condition to                            undergo the procedure.                           After obtaining informed consent, the  endoscope was                            passed under direct vision. Throughout the                            procedure, the patient's blood pressure, pulse, and                            oxygen saturations were monitored continuously. The                            Endoscope was introduced through the mouth, and                            advanced to the jejunum. The upper GI endoscopy was                            accomplished without difficulty. The patient                            tolerated the procedure well. Scope In: Scope Out: Findings:                 Esophagogastric landmarks were identified: the                            Z-line was found at 35 cm, the gastroesophageal                            junction was found at 35 cm and the upper extent of                            the gastric folds was found at 35 cm from the                            incisors.                           One  benign-appearing, intrinsic moderate stenosis                            was found 35 cm from the incisors. A TTS dilator                            was passed through the scope. Dilation with an                            05-30-12 mm balloon dilator was performed to 11 mm                            and 12 mm at which point a mucosal wrent was noted.                            The stricture was biopsied with a cold forceps for                            opening the stricture further.                           The exam of the esophagus was otherwise normal.                           Evidence of a patent Billroth II gastrojejunostomy                            was found. The gastrojejunal anastomosis was                            characterized by friable mucosa with contact oozing                            with endoscope. This was traversed. The efferent                            limb was examined. The afferent limb was examined.                           The exam of the stomach was otherwise  normal but                            there was retained fluid / secretions, likely from                            vagotomy / poor motility.                           Biopsies were taken with a cold forceps in the                            gastric body for Helicobacter pylori testing.  The examined small bowel limb was normal. Complications:            No immediate complications. Estimated blood loss:                            Minimal. Estimated Blood Loss:     Estimated blood loss was minimal. Impression:               - Esophagogastric landmarks identified.                           - Benign-appearing esophageal stenosis. Dilated to                            11m. Biopsied to open the stricture further.                           - Normal esophagus otherwise                           - Patent Billroth II gastrojejunostomy was found,                            characterized by friable mucosa.                           - Normal stomach remnant otherwise - biopsied to                            rule out H pylori                           - Normal examined small bowel limb. Recommendation:           - Patient has a contact number available for                            emergencies. The signs and symptoms of potential                            delayed complications were discussed with the                            patient. Return to normal activities tomorrow.                            Written discharge instructions were provided to the                            patient.                           - Soft diet.                           - Continue present medications.                           -  Continue omeprazole twice daily and carafate                            (will change carafate to tablets to make slurries                            to use PRN)                           - Await pathology results.                           - If dysphagia persists will  need repeat EGD with                            further dilation (this will likely be the case                            given lumen only at 53m at this point)                           - go to the lab for repeat CBC to make sure Hgb has                            responded to IV Iron                           - Reglan as needed if early satiety, etc SRemo LippsP. Jessyka Austria, MD 07/30/2022 10:31:00 AM This report has been signed electronically.

## 2022-07-30 NOTE — Progress Notes (Signed)
Pt's states no medical or surgical changes since previsit or office visit. 

## 2022-07-30 NOTE — Progress Notes (Signed)
0959 Robinul 0.1 mg IV given due large amount of secretions upon assessment.  MD made aware, vss

## 2022-07-30 NOTE — Patient Instructions (Addendum)
Await pathology results.  Post dilation diet instructions provided/handout provided.   Please go to the lab downstairs for bloodwork. A RX for Carafate tablets has been sent to your pharmacy.   YOU HAD AN ENDOSCOPIC PROCEDURE TODAY AT [facility]:   Refer to the procedure report that was given to you for any specific questions about what was found during the examination.  If the procedure report does not answer your questions, please call your gastroenterologist to clarify.  If you requested that your care partner not be given the details of your procedure findings, then the procedure report has been included in a sealed envelope for you to review at your convenience later.  YOU SHOULD EXPECT: Some feelings of bloating in the abdomen. Passage of more gas than usual.  Walking can help get rid of the air that was put into your GI tract during the procedure and reduce the bloating. If you had a lower endoscopy (such as a colonoscopy or flexible sigmoidoscopy) you may notice spotting of blood in your stool or on the toilet paper. If you underwent a bowel prep for your procedure, you may not have a normal bowel movement for a few days.  Please Note:  You might notice some irritation and congestion in your nose or some drainage.  This is from the oxygen used during your procedure.  There is no need for concern and it should clear up in a day or so.  SYMPTOMS TO REPORT IMMEDIATELY:  Following upper endoscopy (EGD)  Vomiting of blood or coffee ground material  New chest pain or pain under the shoulder blades  Painful or persistently difficult swallowing  New shortness of breath  Fever of 100F or higher  Black, tarry-looking stools  For urgent or emergent issues, a gastroenterologist can be reached at any hour by calling [phone]. Do not use MyChart messaging for urgent concerns.    DIET:  Nothing by mouth until 11:30 am.  Clear liquids from 11:30 to 12:30 pm, then a soft  diet for the rest of today.  Drink plenty of fluids but you should avoid alcoholic beverages for 24 hours.  ACTIVITY:  You should plan to take it easy for the rest of today and you should NOT DRIVE or use heavy machinery until tomorrow (because of the sedation medicines used during the test).    FOLLOW UP: Our staff will call the number listed on your records the next business day following your procedure.  We will call around 7:15- 8:00 am to check on you and address any questions or concerns that you may have regarding the information given to you following your procedure. If we do not reach you, we will leave a message.     If any biopsies were taken you will be contacted by phone or by letter within the next 1-3 weeks.  Please call us at [phone] if you have not heard about the biopsies in 3 weeks.    SIGNATURES/CONFIDENTIALITY: You and/or your care partner have signed paperwork which will be entered into your electronic medical record.  These signatures attest to the fact that that the information above on your After Visit Summary has been reviewed and is understood.  Full responsibility of the confidentiality of this discharge information lies with you and/or your care-partner.

## 2022-07-30 NOTE — Progress Notes (Signed)
Report given to PACU, vss 

## 2022-07-30 NOTE — Progress Notes (Signed)
Called to room to assist during endoscopic procedure.  Patient ID and intended procedure confirmed with present staff. Received instructions for my participation in the procedure from the performing physician.

## 2022-07-30 NOTE — Progress Notes (Signed)
[facility] History and Physical   Primary Care Physician:  Tisovec, Fransico Him, MD   Reason for Procedure:   Iron deficiency, dysphagia - last EGD incomplete  Plan:    EGD with possible dilation     HPI: Teresa Lozano is a 72 y.o. female  here for EGD to re-evaluate stomach - last EGD incomplete due to residual food in the stomach. Dysphagia with severe GEJ stenosis dilated with the scope itself. Surgical anastomosis from prior Bilroth II not well seen but appeared inflamed. Now on BID omeprazole and carafate - here for repeat EGD. Referred for IV iron infusions given severe IDA. Upper GI series did not show any concerning pathology at the anastomosis. She is swallowing better since the prior dilation.  Otherwise feels well without any cardiopulmonary symptoms.   I have discussed risks / benefits of anesthesia and endoscopic procedure with [name] and they wish to proceed with the exams as outlined today.    Past Medical History:  Diagnosis Date   Allergy    Arthritis    Chicken pox    Frequent headaches    GERD (gastroesophageal reflux disease)    History of kidney stones    Hyperlipidemia    Hypertension    Osteoporosis     Past Surgical History:  Procedure Laterality Date   CERVICAL FUSION  2000   COLONOSCOPY     ESOPHAGOGASTRODUODENOSCOPY  11/2016   ESOPHAGOGASTRODUODENOSCOPY (EGD) WITH PROPOFOL N/A 12/28/2016   Procedure: ESOPHAGOGASTRODUODENOSCOPY (EGD) WITH PROPOFOL;  Surgeon: Manus Gunning, MD;  Location: [facility];  Service: Gastroenterology;  Laterality: N/A;   EYE SURGERY     STOMACH SURGERY  04/2017   REMOVED ULCER   TONSILLECTOMY     UPPER GASTROINTESTINAL ENDOSCOPY      Prior to Admission medications   Medication Sig Start Date End Date Taking? Authorizing Provider  amLODipine (NORVASC) 10 MG tablet Take 10 mg by mouth daily. 06/25/22  Yes [provider]  butalbital-acetaminophen-caffeine (FIORICET)  50-325-40 MG tablet One to two tablets by mouth as needed for headache. No more than 4/week 03/30/22  Yes Lomax, Amy, NP  cetirizine (ZYRTEC) 10 MG tablet Take 10 mg by mouth daily.   Yes [provider]  CRANBERRY PO Take 2 capsules by mouth daily.   Yes [provider]  cyclobenzaprine (FLEXERIL) 5 MG tablet Take 1 tablet (5 mg total) by mouth 2 (two) times daily as needed for muscle spasms. 06/15/22  Yes Lomax, Amy, NP  imipramine (TOFRANIL) 25 MG tablet Take 3 tablets (75 mg total) by mouth at bedtime. 03/30/22  Yes Lomax, Amy, NP  levETIRAcetam (KEPPRA) 500 MG tablet Take 1 tablet (500 mg total) by mouth 2 (two) times daily. 03/30/22  Yes Lomax, Amy, NP  Magnesium 500 MG CAPS Take 500 mg by mouth 2 (two) times a week.    Yes [provider]  metoCLOPramide (REGLAN) 5 MG tablet Take 1 tablet (5 mg total) by mouth 3 (three) times daily as needed for nausea. 07/08/22  Yes Keyarah Mcroy, Carlota Raspberry, MD  omeprazole (PRILOSEC) 40 MG capsule Take 1 capsule (40 mg total) by mouth 2 (two) times daily. 07/01/22  Yes Kennedy-Smith, Patrecia Pour, NP  ondansetron (ZOFRAN) 4 MG tablet Take 1 tablet (4 mg total) by mouth every 8 (eight) hours as needed. 03/30/22  Yes Lomax, Amy, NP  polyethylene glycol (MIRALAX / GLYCOLAX) packet Take 17 g by mouth daily as needed for mild constipation.   Yes [provider]  rosuvastatin (CRESTOR) 10 MG tablet Take by mouth. 08/21/21  Yes [provider]  sucralfate (CARAFATE) 1 GM/10ML suspension Take 10 mLs (1 g total) by mouth with breakfast, with lunch, and with evening meal. 07/08/22  Yes Matha Masse, Carlota Raspberry, MD  VITAMIN D PO Take 5,000 Units by mouth daily.   Yes [provider]  gabapentin (NEURONTIN) 300 MG capsule Take 1 capsule (300 mg total) by mouth at bedtime. Patient not taking: Reported on 07/30/2022 09/23/21   Debbora Presto, NP  Multiple Vitamin (MULTI-VITAMIN DAILY PO) Take 1 tablet by mouth daily.    [provider]  SUMAtriptan (IMITREX) 100 MG tablet TAKE 1 TAB AS NEEDED FOR MIGRAINE. MAY REPEAT IN 2 HRS IF HEADACHE PERSISTS OR RECURS. DNF 6/19 03/30/22   Lomax, Amy, NP    Current Outpatient Medications  Medication Sig Dispense Refill   amLODipine (NORVASC) 10 MG tablet Take 10 mg by mouth daily.     butalbital-acetaminophen-caffeine (FIORICET) 50-325-40 MG tablet One to two tablets by mouth as needed for headache. No more than 4/week 16 tablet 5   cetirizine (ZYRTEC) 10 MG tablet Take 10 mg by mouth daily.     CRANBERRY PO Take 2 capsules by mouth daily.     cyclobenzaprine (FLEXERIL) 5 MG tablet Take 1 tablet (5 mg total) by mouth 2 (two) times daily as needed for muscle spasms. 180 tablet 3   imipramine (TOFRANIL) 25 MG tablet Take 3 tablets (75 mg total) by mouth at bedtime. 270 tablet 3   levETIRAcetam (KEPPRA) 500 MG tablet Take 1 tablet (500 mg total) by mouth 2 (two) times daily. 180 tablet 3   Magnesium 500 MG CAPS Take 500 mg by mouth 2 (two) times a week.      metoCLOPramide (REGLAN) 5 MG tablet Take 1 tablet (5 mg total) by mouth 3 (three) times daily as needed for nausea. 30 tablet 1   omeprazole (PRILOSEC) 40 MG capsule Take 1 capsule (40 mg total) by mouth 2 (two) times daily. 180 capsule 1   ondansetron (ZOFRAN) 4 MG tablet Take 1 tablet (4 mg total) by mouth every 8 (eight) hours as needed. 20 tablet 5   polyethylene glycol (MIRALAX / GLYCOLAX) packet Take 17 g by mouth daily as needed for mild constipation.     rosuvastatin (CRESTOR) 10 MG tablet Take by mouth.     sucralfate (CARAFATE) 1 GM/10ML suspension Take 10 mLs (1 g total) by mouth with breakfast, with lunch, and with evening meal. 420 mL 1   VITAMIN D PO Take 5,000 Units by mouth daily.     gabapentin (NEURONTIN) 300 MG capsule Take 1 capsule (300 mg total) by mouth at bedtime. (Patient not taking: Reported on 07/30/2022) 90 capsule 3   Multiple Vitamin (MULTI-VITAMIN DAILY PO) Take 1 tablet by mouth daily.     SUMAtriptan  (IMITREX) 100 MG tablet TAKE 1 TAB AS NEEDED FOR MIGRAINE. MAY REPEAT IN 2 HRS IF HEADACHE PERSISTS OR RECURS. DNF 6/19 10 tablet 11   Current Facility-Administered Medications  Medication Dose Route Frequency Provider Last Rate Last Admin   0.9 %  sodium chloride infusion  500 mL Intravenous Once Brandom Kerwin, Carlota Raspberry, MD       0.9 %  sodium chloride infusion  500 mL Intravenous Once Japji Kok, Carlota Raspberry, MD        Allergies as of 07/30/2022 - Review Complete 07/30/2022  Allergen Reaction Noted   Robaxin [methocarbamol] Nausea And Vomiting 04/10/2018  Lisinopril Cough 04/27/2016    Family History  Problem Relation Age of Onset   Hyperlipidemia Mother    Hypertension Mother    Prostate cancer Father    Hyperlipidemia Father    Heart disease Father    Heart disease Brother    Hypertension Sister    Esophageal cancer Sister    Hypertension Maternal Grandmother    Colon cancer Neg Hx    Colon polyps Neg Hx    Stomach cancer Neg Hx    Rectal cancer Neg Hx     Social History   Socioeconomic History   Marital status: Widowed    Spouse name: Not on file   Number of children: Not on file   Years of education: Not on file   Highest education level: Not on file  Occupational History   Not on file  Tobacco Use   Smoking status: Never   Smokeless tobacco: Never  Vaping Use   Vaping Use: Never used  Substance and Sexual Activity   Alcohol use: No    Alcohol/week: 0.0 standard drinks of alcohol   Drug use: No   Sexual activity: Never  Other Topics Concern   Not on file  Social History Narrative   Not on file   Social Determinants of Health   Financial Resource Strain: Not on file  Food Insecurity: Not on file  Transportation Needs: Not on file  Physical Activity: Not on file  Stress: Not on file  Social Connections: Not on file  Intimate Partner Violence: Not on file    Review of Systems: All other review of systems negative except as mentioned in the  HPI.  Physical Exam: Vital signs BP 109/62   Pulse 92   Temp 97.7 F (36.5 C)   Ht '5\' 2"'$  (1.575 m)   Wt 104 lb (47.2 kg)   SpO2 100%   BMI 19.02 kg/m   General:   Alert,  Well-developed, pleasant and cooperative in NAD Lungs:  Clear throughout to auscultation.   Heart:  Regular rate and rhythm Abdomen:  Soft, nontender and nondistended.   Neuro/Psych:  Alert and cooperative. Normal mood and affect. A and O x 3  Jolly Mango, MD [facility]

## 2022-07-30 NOTE — Progress Notes (Signed)
1010 HR > 100 with esmolol 25 mg given IV, MD updated, vss

## 2022-08-01 ENCOUNTER — Other Ambulatory Visit: Payer: Self-pay

## 2022-08-01 DIAGNOSIS — D509 Iron deficiency anemia, unspecified: Secondary | ICD-10-CM

## 2022-08-02 ENCOUNTER — Telehealth: Payer: Self-pay

## 2022-08-02 NOTE — Telephone Encounter (Signed)
  Follow up Call-     07/30/2022    9:10 AM 07/08/2022   10:27 AM  Call back number  Post procedure Call Back phone  # [phone]  Permission to leave phone message Yes Yes     Patient questions:  Do you have a fever, pain , or abdominal swelling? No. Pain Score  0 *  Have you tolerated food without any problems? Yes.    Have you been able to return to your normal activities? Yes.    Do you have any questions about your discharge instructions: Diet   No. Medications  No. Follow up visit  No.  Do you have questions or concerns about your Care? No.  Actions: * If pain score is 4 or above: No action needed, pain <4.

## 2022-08-04 ENCOUNTER — Other Ambulatory Visit: Payer: Self-pay

## 2022-08-04 DIAGNOSIS — D509 Iron deficiency anemia, unspecified: Secondary | ICD-10-CM

## 2022-08-04 MED ORDER — NA SULFATE-K SULFATE-MG SULF 17.5-3.13-1.6 GM/177ML PO SOLN: 1.0000 | Freq: Once | ORAL | 0 refills | Status: AC

## 2022-08-17 ENCOUNTER — Encounter: Payer: Medicare Other | Admitting: Gastroenterology

## 2022-08-23 ENCOUNTER — Encounter: Payer: Self-pay | Admitting: Gastroenterology

## 2022-08-23 ENCOUNTER — Ambulatory Visit (AMBULATORY_SURGERY_CENTER): Payer: Medicare Other | Admitting: Gastroenterology

## 2022-08-23 VITALS — BP 109/66 | HR 95 | Temp 95.7°F | Resp 97 | Ht 62.0 in | Wt 104.0 lb

## 2022-08-23 DIAGNOSIS — D509 Iron deficiency anemia, unspecified: Secondary | ICD-10-CM

## 2022-08-23 DIAGNOSIS — I10 Essential (primary) hypertension: Secondary | ICD-10-CM | POA: Diagnosis not present

## 2022-08-23 DIAGNOSIS — D120 Benign neoplasm of cecum: Secondary | ICD-10-CM

## 2022-08-23 HISTORY — PX: COLONOSCOPY WITH PROPOFOL: SHX5780

## 2022-08-23 MED ORDER — SODIUM CHLORIDE 0.9 % IV SOLN: 500.0000 mL | Freq: Once | INTRAVENOUS | Status: DC

## 2022-08-23 NOTE — Progress Notes (Signed)
Pt has good respiratory effort. ETCO2 noted via nasal cannula.

## 2022-08-23 NOTE — Op Note (Signed)
[facility] Patient Name: Teresa Lozano Procedure Date: 08/23/2022 8:08 AM MRN: [id number] Endoscopist: Remo Lipps P. Havery Moros , MD, [id number] Age: 72 Referring MD:  Date of Birth: [birth year] Gender: Female Account #: [id number] Procedure:                Colonoscopy Indications:              Iron deficiency anemia - last colonoscopy 01/2019.                            EGD showed friable gastric surgical anastomosis. Medicines:                Monitored Anesthesia Care Procedure:                Pre-Anesthesia Assessment:                           - Prior to the procedure, a History and Physical                            was performed, and patient medications and                            allergies were reviewed. The patient's tolerance of                            previous anesthesia was also reviewed. The risks                            and benefits of the procedure and the sedation                            options and risks were discussed with the patient.                            All questions were answered, and informed consent                            was obtained. Prior Anticoagulants: The patient has                            taken no anticoagulant or antiplatelet agents. ASA                            Grade Assessment: II - A patient with mild systemic                            disease. After reviewing the risks and benefits,                            the patient was deemed in satisfactory condition to                            undergo the procedure.  After obtaining informed consent, the colonoscope                            was passed under direct vision. Throughout the                            procedure, the patient's blood pressure, pulse, and                            oxygen saturations were monitored continuously. The                            [id number]-H190TL Slim SN [id number] was introduced through                             the anus and advanced to the the cecum, identified                            by appendiceal orifice and ileocecal valve. The                            colonoscopy was technically difficult and complex                            due to a tortuous colon. The patient tolerated the                            procedure well. The quality of the bowel                            preparation was adequate. The ileocecal valve,                            appendiceal orifice, and rectum were photographed. Scope In: 8:09:52 AM Scope Out: 8:33:08 AM Scope Withdrawal Time: 0 hours 15 minutes 52 seconds  Total Procedure Duration: 0 hours 23 minutes 16 seconds  Findings:                 The perianal and digital rectal examinations were                            normal.                           A diminutive polyp was found in the cecum. The                            polyp was sessile. The polyp was removed with a                            cold snare. Resection and retrieval were complete.                           The colon was extremely tortuous - pediatric  ultraslim colonoscope used for this exam given the                            patient's BMI.                           The exam was otherwise without abnormality.                            Retroflexed views of the rectum not obtained due to                            small size of the rectum and poor air retention. Complications:            No immediate complications. Estimated blood loss:                            Minimal. Estimated Blood Loss:     Estimated blood loss was minimal. Impression:               - One diminutive polyp in the cecum, removed with a                            cold snare. Resected and retrieved.                           - Tortuous colon.                           - The examination was otherwise normal.                           No cause for iron deficiency on this exam, which is                             more likely due to findings on endoscopy. Recommendation:           - Patient has a contact number available for                            emergencies. The signs and symptoms of potential                            delayed complications were discussed with the                            patient. Return to normal activities tomorrow.                            Written discharge instructions were provided to the                            patient.                           - Resume previous diet.                           -  Continue present medications.                           - Await pathology results. Likely no further                            routine surveillance colonoscopy recommended if no                            advanced pathology on polyp given the patient's age                            and tortous colon / technical difficulty of this                            exam, at risk for complications. Remo Lipps P. Jamair Cato, MD 08/23/2022 8:43:07 AM This report has been signed electronically.

## 2022-08-23 NOTE — Progress Notes (Signed)
[facility] History and Physical   Primary Care Physician:  Tisovec, Fransico Him, MD   Reason for Procedure:   Iron deficiency anemia   Plan:    colonoscopy     HPI: Teresa Lozano is a 72 y.o. female  here for colonoscopy to evaluate iron deficiency. EGD showed friable surgical anastomosis would could be cause however last colonoscopy 01/2019. Colonoscopy to ensure no cause in lower tract.   . Patient denies any bowel symptoms at this time. Otherwise feels well without any cardiopulmonary symptoms.   I have discussed risks / benefits of anesthesia and endoscopic procedure with [name] and they wish to proceed with the exams as outlined today.    Past Medical History:  Diagnosis Date   Allergy    Arthritis    Chicken pox    Frequent headaches    GERD (gastroesophageal reflux disease)    History of kidney stones    Hyperlipidemia    Hypertension    Osteoporosis     Past Surgical History:  Procedure Laterality Date   CERVICAL FUSION  2000   COLONOSCOPY  01/18/2019   COLONOSCOPY WITH PROPOFOL  08/23/2022   ESOPHAGOGASTRODUODENOSCOPY  11/2016   ESOPHAGOGASTRODUODENOSCOPY (EGD) WITH PROPOFOL N/A 12/28/2016   Procedure: ESOPHAGOGASTRODUODENOSCOPY (EGD) WITH PROPOFOL;  Surgeon: Manus Gunning, MD;  Location: [facility];  Service: Gastroenterology;  Laterality: N/A;   EYE SURGERY     STOMACH SURGERY  04/2017   REMOVED ULCER   TONSILLECTOMY     UPPER GASTROINTESTINAL ENDOSCOPY  07/2022    Prior to Admission medications   Medication Sig Start Date End Date Taking? Authorizing Provider  amLODipine (NORVASC) 10 MG tablet Take 10 mg by mouth daily. 06/25/22  Yes [provider]  butalbital-acetaminophen-caffeine (FIORICET) 50-325-40 MG tablet One to two tablets by mouth as needed for headache. No more than 4/week 03/30/22  Yes Lomax, Amy, NP  cetirizine (ZYRTEC) 10 MG tablet Take 10 mg by mouth daily.   Yes [provider]   CRANBERRY PO Take 2 capsules by mouth daily.   Yes [provider]  cyclobenzaprine (FLEXERIL) 5 MG tablet Take 1 tablet (5 mg total) by mouth 2 (two) times daily as needed for muscle spasms. 06/15/22  Yes Lomax, Amy, NP  imipramine (TOFRANIL) 25 MG tablet Take 3 tablets (75 mg total) by mouth at bedtime. 03/30/22  Yes Lomax, Amy, NP  levETIRAcetam (KEPPRA) 500 MG tablet Take 1 tablet (500 mg total) by mouth 2 (two) times daily. 03/30/22  Yes Lomax, Amy, NP  Magnesium 500 MG CAPS Take 500 mg by mouth 2 (two) times a week.    Yes [provider]  omeprazole (PRILOSEC) 40 MG capsule Take 1 capsule (40 mg total) by mouth 2 (two) times daily. 07/01/22  Yes Noralyn Pick, NP  polyethylene glycol (MIRALAX / GLYCOLAX) packet Take 17 g by mouth daily as needed for mild constipation.   Yes [provider]  rosuvastatin (CRESTOR) 10 MG tablet Take by mouth. 08/21/21  Yes [provider]  sucralfate (CARAFATE) 1 g tablet Take 1 tablet (1 g total) by mouth every 6 (six) hours as needed. 07/30/22  Yes Jakki Doughty, Carlota Raspberry, MD  VITAMIN D PO Take 5,000 Units by mouth daily.   Yes [provider]  metoCLOPramide (REGLAN) 5 MG tablet Take 1 tablet (5 mg total) by mouth 3 (three) times daily as needed for nausea. 07/08/22   Denesha Brouse, Carlota Raspberry, MD  Multiple Vitamin (MULTI-VITAMIN DAILY PO)  Take 1 tablet by mouth daily.    [provider]  ondansetron (ZOFRAN) 4 MG tablet Take 1 tablet (4 mg total) by mouth every 8 (eight) hours as needed. 03/30/22   Lomax, Amy, NP  SUMAtriptan (IMITREX) 100 MG tablet TAKE 1 TAB AS NEEDED FOR MIGRAINE. MAY REPEAT IN 2 HRS IF HEADACHE PERSISTS OR RECURS. DNF 6/19 03/30/22   Lomax, Amy, NP    Current Outpatient Medications  Medication Sig Dispense Refill   amLODipine (NORVASC) 10 MG tablet Take 10 mg by mouth daily.     butalbital-acetaminophen-caffeine (FIORICET) 50-325-40 MG tablet One to two tablets by mouth as needed for  headache. No more than 4/week 16 tablet 5   cetirizine (ZYRTEC) 10 MG tablet Take 10 mg by mouth daily.     CRANBERRY PO Take 2 capsules by mouth daily.     cyclobenzaprine (FLEXERIL) 5 MG tablet Take 1 tablet (5 mg total) by mouth 2 (two) times daily as needed for muscle spasms. 180 tablet 3   imipramine (TOFRANIL) 25 MG tablet Take 3 tablets (75 mg total) by mouth at bedtime. 270 tablet 3   levETIRAcetam (KEPPRA) 500 MG tablet Take 1 tablet (500 mg total) by mouth 2 (two) times daily. 180 tablet 3   Magnesium 500 MG CAPS Take 500 mg by mouth 2 (two) times a week.      omeprazole (PRILOSEC) 40 MG capsule Take 1 capsule (40 mg total) by mouth 2 (two) times daily. 180 capsule 1   polyethylene glycol (MIRALAX / GLYCOLAX) packet Take 17 g by mouth daily as needed for mild constipation.     rosuvastatin (CRESTOR) 10 MG tablet Take by mouth.     sucralfate (CARAFATE) 1 g tablet Take 1 tablet (1 g total) by mouth every 6 (six) hours as needed. 90 tablet 1   VITAMIN D PO Take 5,000 Units by mouth daily.     metoCLOPramide (REGLAN) 5 MG tablet Take 1 tablet (5 mg total) by mouth 3 (three) times daily as needed for nausea. 30 tablet 1   Multiple Vitamin (MULTI-VITAMIN DAILY PO) Take 1 tablet by mouth daily.     ondansetron (ZOFRAN) 4 MG tablet Take 1 tablet (4 mg total) by mouth every 8 (eight) hours as needed. 20 tablet 5   SUMAtriptan (IMITREX) 100 MG tablet TAKE 1 TAB AS NEEDED FOR MIGRAINE. MAY REPEAT IN 2 HRS IF HEADACHE PERSISTS OR RECURS. DNF 6/19 10 tablet 11   Current Facility-Administered Medications  Medication Dose Route Frequency Provider Last Rate Last Admin   0.9 %  sodium chloride infusion  500 mL Intravenous Once Jensen Kilburg, Carlota Raspberry, MD        Allergies as of 08/23/2022 - Review Complete 08/23/2022  Allergen Reaction Noted   Robaxin [methocarbamol] Nausea And Vomiting 04/10/2018   Lisinopril Cough 04/27/2016    Family History  Problem Relation Age of Onset   Hyperlipidemia  Mother    Hypertension Mother    Prostate cancer Father    Hyperlipidemia Father    Heart disease Father    Heart disease Brother    Hypertension Sister    Esophageal cancer Sister    Hypertension Maternal Grandmother    Colon cancer Neg Hx    Colon polyps Neg Hx    Stomach cancer Neg Hx    Rectal cancer Neg Hx     Social History   Socioeconomic History   Marital status: Widowed    Spouse name: Not on file   Number  of children: Not on file   Years of education: Not on file   Highest education level: Not on file  Occupational History   Not on file  Tobacco Use   Smoking status: Never   Smokeless tobacco: Never  Vaping Use   Vaping Use: Never used  Substance and Sexual Activity   Alcohol use: No    Alcohol/week: 0.0 standard drinks of alcohol   Drug use: No   Sexual activity: Not Currently  Other Topics Concern   Not on file  Social History Narrative   Not on file   Social Determinants of Health   Financial Resource Strain: Not on file  Food Insecurity: Not on file  Transportation Needs: Not on file  Physical Activity: Not on file  Stress: Not on file  Social Connections: Not on file  Intimate Partner Violence: Not on file    Review of Systems: All other review of systems negative except as mentioned in the HPI.  Physical Exam: Vital signs BP 112/72   Pulse (!) 104   Temp (!) 95.7 F (35.4 C) (Temporal)   Ht '5\' 2"'$  (1.575 m)   Wt 104 lb (47.2 kg)   SpO2 100%   BMI 19.02 kg/m   General:   Alert,  Well-developed, pleasant and cooperative in NAD Lungs:  Clear throughout to auscultation.   Heart:  Regular rate and rhythm Abdomen:  Soft, nontender and nondistended.   Neuro/Psych:  Alert and cooperative. Normal mood and affect. A and O x 3  Jolly Mango, MD [facility]

## 2022-08-23 NOTE — Progress Notes (Signed)
Pt awake, alert and oriented. VSS. Airway intact. SBAR complete to RN. All questions answered.  

## 2022-08-23 NOTE — Progress Notes (Signed)
Pt's states no medical or surgical changes since previsit or office visit.   Pt's HR 130 upon arrival, AP 104/min at 751 after checking pt in, pt states "my HR is always high"

## 2022-08-23 NOTE — Patient Instructions (Signed)
Handout on polyps given.  Resume previous diet and continue present medications.    YOU HAD AN ENDOSCOPIC PROCEDURE TODAY AT [facility]:   Refer to the procedure report that was given to you for any specific questions about what was found during the examination.  If the procedure report does not answer your questions, please call your gastroenterologist to clarify.  If you requested that your care partner not be given the details of your procedure findings, then the procedure report has been included in a sealed envelope for you to review at your convenience later.  YOU SHOULD EXPECT: Some feelings of bloating in the abdomen. Passage of more gas than usual.  Walking can help get rid of the air that was put into your GI tract during the procedure and reduce the bloating. If you had a lower endoscopy (such as a colonoscopy or flexible sigmoidoscopy) you may notice spotting of blood in your stool or on the toilet paper. If you underwent a bowel prep for your procedure, you may not have a normal bowel movement for a few days.  Please Note:  You might notice some irritation and congestion in your nose or some drainage.  This is from the oxygen used during your procedure.  There is no need for concern and it should clear up in a day or so.  SYMPTOMS TO REPORT IMMEDIATELY:  Following lower endoscopy (colonoscopy or flexible sigmoidoscopy):  Excessive amounts of blood in the stool  Significant tenderness or worsening of abdominal pains  Swelling of the abdomen that is new, acute  Fever of 100F or higher   For urgent or emergent issues, a gastroenterologist can be reached at any hour by calling [phone]. Do not use MyChart messaging for urgent concerns.    DIET:  We do recommend a small meal at first, but then you may proceed to your regular diet.  Drink plenty of fluids but you should avoid alcoholic beverages for 24 hours.  ACTIVITY:  You should plan to take it easy for the  rest of today and you should NOT DRIVE or use heavy machinery until tomorrow (because of the sedation medicines used during the test).    FOLLOW UP: Our staff will call the number listed on your records the next business day following your procedure.  We will call around 7:15- 8:00 am to check on you and address any questions or concerns that you may have regarding the information given to you following your procedure. If we do not reach you, we will leave a message.     If any biopsies were taken you will be contacted by phone or by letter within the next 1-3 weeks.  Please call us at [phone] if you have not heard about the biopsies in 3 weeks.    SIGNATURES/CONFIDENTIALITY: You and/or your care partner have signed paperwork which will be entered into your electronic medical record.  These signatures attest to the fact that that the information above on your After Visit Summary has been reviewed and is understood.  Full responsibility of the confidentiality of this discharge information lies with you and/or your care-partner.

## 2022-08-23 NOTE — Progress Notes (Signed)
Called to room to assist during endoscopic procedure.  Patient ID and intended procedure confirmed with present staff. Received instructions for my participation in the procedure from the performing physician.  

## 2022-08-24 ENCOUNTER — Telehealth: Payer: Self-pay

## 2022-08-24 NOTE — Telephone Encounter (Signed)
Left message on answering machine. 

## 2022-08-25 ENCOUNTER — Telehealth: Payer: Self-pay

## 2022-08-25 NOTE — Telephone Encounter (Signed)
MyChart message sent to patient.

## 2022-08-25 NOTE — Telephone Encounter (Signed)
-----   Message from Roetta Sessions, [place] sent at 08/01/2022  4:48 PM EST ----- Regarding: cbc Cbc due around 2-12 (order is in )

## 2022-08-26 ENCOUNTER — Other Ambulatory Visit: Payer: Self-pay

## 2022-08-26 ENCOUNTER — Telehealth: Payer: Self-pay | Admitting: Nurse Practitioner

## 2022-08-26 DIAGNOSIS — D509 Iron deficiency anemia, unspecified: Secondary | ICD-10-CM

## 2022-08-26 NOTE — Telephone Encounter (Signed)
PT has to come and have labs done next week and she also has to do lab work for her pcp as well. She doesn't want to get pricked twice so she is wondering if the results can be sent to her pcp as well. Please advise.

## 2022-08-26 NOTE — Telephone Encounter (Signed)
Pt stated that she has an appointment with her PCP next week. Pt stated that she was wondering if she could get there office to fax labs to our office. Pt was notified that our office is requesting to draw an IBC + Ferritin:  Pt wrote name of the lab down that we are requesting: Pt verbalized understanding with all questions answered.

## 2022-08-31 ENCOUNTER — Ambulatory Visit: Payer: Medicare Other | Admitting: Nurse Practitioner

## 2022-08-31 DIAGNOSIS — I10 Essential (primary) hypertension: Secondary | ICD-10-CM | POA: Diagnosis not present

## 2022-08-31 DIAGNOSIS — K219 Gastro-esophageal reflux disease without esophagitis: Secondary | ICD-10-CM | POA: Diagnosis not present

## 2022-08-31 DIAGNOSIS — M810 Age-related osteoporosis without current pathological fracture: Secondary | ICD-10-CM | POA: Diagnosis not present

## 2022-08-31 DIAGNOSIS — E7800 Pure hypercholesterolemia, unspecified: Secondary | ICD-10-CM | POA: Diagnosis not present

## 2022-08-31 DIAGNOSIS — R7989 Other specified abnormal findings of blood chemistry: Secondary | ICD-10-CM | POA: Diagnosis not present

## 2022-08-31 NOTE — Progress Notes (Unsigned)
No chief complaint on file.   HISTORY OF PRESENT ILLNESS:  08/31/22 ALL:  Nikishia returns for follow up for headaches. She was last seen 03/2022 and continued to have regular tension style headaches and neck pain. We continued imipramine 71m QHS and levetiracetam 5022mBID. We added cyclobenzaprine 71m24mt bedtime. Butalbital, sumatriptan and ondansetron continued as needed. She called 05/2022 and reported it was helping but requested to increase to BID. Since,   03/30/2022 ALL: CarDedorahturns for follow up for headaches. She was last seen 09/2021 and we continued imipramine 777m71mS and levetiracetam 71mg19m but added gabapentin 300mg 677mfor continued tension style headaches. Since, she reports no improvement. She took gabapentin for about 4 months but did not note any improvement in headaches or neck pain. She continues to have near daily headaches. She wakes with headaches 2-3 times a week. She has 2-3 migrainous headaches a month easily aborted with sumatriptan. Occasionally uses ondansetron for nausea. She is taking butalbital 4 times a week and Excedrin often. She has difficulty staying asleep 3-4 days a week. She is unsure why she wakes up but is not able to go back to sleep. She does snore, worse on her back. She denies dry mouth or EDS. She denies family history of sleep apnea. She reports shooting pain in hands and feet had improved over the summer but are back. No significant worsening. No weakness.   Tried and failed: topiramate (contraindicated for kidney stones), propranolol (edema), amitriptyline (ineffective), imipramine (on now), levetiracetam (on now), sumatriptan (works well)  09/23/2021 ALL: Raja Annaliyahns for follow up for migraines. She continued levetiracetam and imipramine and added Emgality at last visit 03/2021. Injections were approved but copay was 500. She feels migraines are very well managed. She uses sumatriptan rarely but feels it is helpful. She continues to have daily  tension headaches. Pain starts in the neck. She is s/p cervical fusion. She is taking 71boutalbital about every other day.   03/26/2021 ALL:  Glender Renadns for follow up for headaches. She added propranolol 20mg B10mt last visit and continued imipramine 71mg QH8md levetiracetam 71mg BID4mmatriptan and butalbital help with abortive therapy. She has about 8-12 headache days with 4-5 migraines per month. 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 She usually takes 4-5 sumatriptan and 16 butalbital every month.   Tried and failed: topiramate (contraindicated for kidney stones), propranolol (edema), amitriptyline (ineffective), imipramine (on now), levetiracetam (on now)  09/23/2020 ALL:  Sullivan B MKEVIONA KENNEBECKy.o. f71ale here today for follow up for headaches. She continues imipramine 777mg QHS 71mlevetiracetam 500mg BID f94mension style and migraine prevention. She rarely takes Sumatriptan but it works for migraine abortion and Fioricet for tension headaches. She is having to take at least 3-4 doses every week. Medications work fairly well. She feels neck pain is stable. She has noted a higher heart rate for the past year. She denies chest pain or palpitations. No dizziness or lightheadedness. She does feel winded with working in her yard. She denies history of asthma. She does not drink much water but does like tea.    HISTORY (copied from Dr Sater's preGarth Bignessote)  Vanshika McDanLoma Pfisterero. wom50 who has had headaches for 40 years.     Update 03/26/2020: She notes migraine headaches once a month but tension headaches are more frequently -once a week.   Most  headaches are occipital and radiate forward.    She continues on imipramine 75 mg nightly and Keppra 500 mg p.o. twice daily.    She tolerates the medications well.   For migraines she takes sumatriptan and usually has a benefit.  For occipitla headaches she  takes fioricet or  OTC Excedrin, often with benefit.     She had kidney stones so can't use topiramate or zonisamide.    She notes shooting pain in her fingers and toes.   This is better than last year.    Hand numbness often involves the ring fingers.     The 4th and 5th toes are more involved than the rest of the foot.   She has pain left > right.   NCV/EMG was normal last year.   She does not note weakness in the hand hands or feet.  She has some neck pain and back pain.   DATA CT has shown severe left facet changes at [facility] in 2011 and much milder facet hypertrophy to the right at [facility]. She has ACDF at [facility] (Dr. Ronnald Ramp 2011)   REVIEW OF SYSTEMS: Out of a complete 14 system review of symptoms, the patient complains only of the following symptoms, headaches, neck pain, elevated heart rate and all other reviewed systems are negative.   ALLERGIES: Allergies  Allergen Reactions   Robaxin [Methocarbamol] Nausea And Vomiting   Lisinopril Cough     HOME MEDICATIONS: Outpatient Medications Prior to Visit  Medication Sig Dispense Refill   amLODipine (NORVASC) 10 MG tablet Take 10 mg by mouth daily.     butalbital-acetaminophen-caffeine (FIORICET) 50-325-40 MG tablet One to two tablets by mouth as needed for headache. No more than 4/week 71 tablet 5   cetirizine (ZYRTEC) 10 MG tablet Take 10 mg by mouth daily.     CRANBERRY PO Take 2 capsules by mouth daily.     cyclobenzaprine (FLEXERIL) 5 MG tablet Take 1 tablet (5 mg total) by mouth 2 (two) times daily as needed for muscle spasms. 180 tablet 3   imipramine (TOFRANIL) 25 MG tablet Take 3 tablets (75 mg total) by mouth at bedtime. 270 tablet 3   levETIRAcetam (KEPPRA) 500 MG tablet Take 1 tablet (500 mg total) by mouth 2 (two) times daily. 180 tablet 3   Magnesium 500 MG CAPS Take 500 mg by mouth 2 (two) times a week.      metoCLOPramide (REGLAN) 5 MG tablet Take 1 tablet (5 mg total) by mouth 3 (three) times daily as needed for nausea. 30  tablet 1   Multiple Vitamin (MULTI-VITAMIN DAILY PO) Take 1 tablet by mouth daily.     omeprazole (PRILOSEC) 40 MG capsule Take 1 capsule (40 mg total) by mouth 2 (two) times daily. 180 capsule 1   ondansetron (ZOFRAN) 4 MG tablet Take 1 tablet (4 mg total) by mouth every 8 (eight) hours as needed. 20 tablet 5   polyethylene glycol (MIRALAX / GLYCOLAX) packet Take 17 g by mouth daily as needed for mild constipation.     rosuvastatin (CRESTOR) 10 MG tablet Take by mouth.     sucralfate (CARAFATE) 1 g tablet Take 1 tablet (1 g total) by mouth every 6 (six) hours as needed. 90 tablet 1   SUMAtriptan (IMITREX) 100 MG tablet TAKE 1 TAB AS NEEDED FOR MIGRAINE. MAY REPEAT IN 2 HRS IF HEADACHE PERSISTS OR RECURS. DNF 6/19 10 tablet 11   VITAMIN D PO Take 5,000 Units by mouth daily.  No facility-administered medications prior to visit.     PAST MEDICAL HISTORY: Past Medical History:  Diagnosis Date   Allergy    Arthritis    Chicken pox    Frequent headaches    GERD (gastroesophageal reflux disease)    History of kidney stones    Hyperlipidemia    Hypertension    Osteoporosis      PAST SURGICAL HISTORY: Past Surgical History:  Procedure Laterality Date   CERVICAL FUSION  2000   COLONOSCOPY  01/18/2019   COLONOSCOPY WITH PROPOFOL  08/23/2022   ESOPHAGOGASTRODUODENOSCOPY  11/2016   ESOPHAGOGASTRODUODENOSCOPY (EGD) WITH PROPOFOL N/A 12/28/2016   Procedure: ESOPHAGOGASTRODUODENOSCOPY (EGD) WITH PROPOFOL;  Surgeon: Manus Gunning, MD;  Location: [facility];  Service: Gastroenterology;  Laterality: N/A;   EYE SURGERY     STOMACH SURGERY  04/2017   REMOVED ULCER   TONSILLECTOMY     UPPER GASTROINTESTINAL ENDOSCOPY  07/2022     FAMILY HISTORY: Family History  Problem Relation Age of Onset   Hyperlipidemia Mother    Hypertension Mother    Prostate cancer Father    Hyperlipidemia Father    Heart disease Father    Heart disease Brother    Hypertension Sister     Esophageal cancer Sister    Hypertension Maternal Grandmother    Colon cancer Neg Hx    Colon polyps Neg Hx    Stomach cancer Neg Hx    Rectal cancer Neg Hx      SOCIAL HISTORY: Social History   Socioeconomic History   Marital status: Widowed    Spouse name: Not on file   Number of children: Not on file   Years of education: Not on file   Highest education level: Not on file  Occupational History   Not on file  Tobacco Use   Smoking status: Never   Smokeless tobacco: Never  Vaping Use   Vaping Use: Never used  Substance and Sexual Activity   Alcohol use: No    Alcohol/week: 0.0 standard drinks of alcohol   Drug use: No   Sexual activity: Not Currently  Other Topics Concern   Not on file  Social History Narrative   Not on file   Social Determinants of Health   Financial Resource Strain: Not on file  Food Insecurity: Not on file  Transportation Needs: Not on file  Physical Activity: Not on file  Stress: Not on file  Social Connections: Not on file  Intimate Partner Violence: Not on file      PHYSICAL EXAM  There were no vitals filed for this visit.     There is no height or weight on file to calculate BMI.   Generalized: Well developed, in no acute distress  Cardiology: normal rate and rhythm, no murmur auscultated  Respiratory: clear to auscultation bilaterally    Neurological examination  Mentation: Alert oriented to time, place, history taking. Follows all commands speech and language fluent Cranial nerve II-XII: Pupils were equal round reactive to light. Extraocular movements were full, visual field were full on confrontational test. Facial sensation and strength were normal. Head turning and shoulder shrug  were normal and symmetric. Motor: The motor testing reveals 5 over 5 strength of all 4 extremities. Good symmetric motor tone is noted throughout.  Gait and station: Gait is normal.     DIAGNOSTIC DATA (LABS, IMAGING, TESTING) - I reviewed  patient records, labs, notes, testing and imaging myself where available.  Lab Results  Component Value Date  WBC 3.4 (L) 07/30/2022   HGB 10.8 (L) 07/30/2022   HCT 34.7 (L) 07/30/2022   MCV 79.6 07/30/2022   PLT 228.0 07/30/2022      Component Value Date/Time   NA 140 07/02/2022 1501   K 3.5 07/02/2022 1501   CL 105 07/02/2022 1501   CO2 26 07/02/2022 1501   GLUCOSE 101 (H) 07/02/2022 1501   BUN 17 07/02/2022 1501   CREATININE 0.89 07/02/2022 1501   CALCIUM 9.1 07/02/2022 1501   PROT 6.5 07/02/2022 1501   ALBUMIN 4.2 07/02/2022 1501   AST 19 07/02/2022 1501   ALT 15 07/02/2022 1501   ALKPHOS 74 07/02/2022 1501   BILITOT 0.2 07/02/2022 1501   GFRNONAA 54 (L) 07/24/2021 0452   GFRAA 47 (L) 03/20/2020 1021   Lab Results  Component Value Date   CHOL 168 04/05/2019   HDL 55.10 04/05/2019   LDLCALC 85 04/05/2019   TRIG 140.0 04/05/2019   CHOLHDL 3 04/05/2019   Lab Results  Component Value Date   HGBA1C 5.2 12/24/2019   Lab Results  Component Value Date   VITAMINB12 703 07/02/2022   No results found for: "TSH"      No data to display               No data to display           ASSESSMENT AND PLAN  72 y.o. year old female  has a past medical history of Allergy, Arthritis, Chicken pox, Frequent headaches, GERD (gastroesophageal reflux disease), History of kidney stones, Hyperlipidemia, Hypertension, and Osteoporosis. here with   Common migraine without intractability  Episodic tension-type headache, not intractable  Cervical facet joint syndrome  She is doing fairly well, today. Headaches are fairly well managed but she does continue to have regular tension style headaches and migraines. She will continue imipramine 53m daily and levetiracetam 50360mBID. I will add cyclobenzaprine 60m36maily at bedtime. Will consider increasing levetiracetam to 750m49mD in future if headaches are not improving. Healthy lifestyle habits encouraged. She will continue  sumatriptan, butalbital and ondansetron as needed. I have encouraged her to limit use of butalbital and Excedrin. She was advised to try to wean butalbital as tolerated. She will follow up in 6 months, sooner if needed.    No orders of the defined types were placed in this encounter.    No orders of the defined types were placed in this encounter.    Lucy Woolever Debbora PrestoN, FNP-C 08/31/2022, 1:44 PM  [facility] [address] [phone]

## 2022-08-31 NOTE — Patient Instructions (Signed)
Below is our plan:  We will continue levetiracetam 500mg  twice daily, imipramine 75mg  at bedtime. Continue Sumatriptan, butalbital and cyclobenzaprine as needed.   Please make sure you are staying well hydrated. I recommend 50-60 ounces daily. Well balanced diet and regular exercise encouraged. Consistent sleep schedule with 6-8 hours recommended.   Please continue follow up with care team as directed.   Follow up with me in 1 year   You may receive a survey regarding today's visit. I encourage you to leave honest feed back as I do use this information to improve patient care. Thank you for seeing me today!

## 2022-09-01 ENCOUNTER — Ambulatory Visit (INDEPENDENT_AMBULATORY_CARE_PROVIDER_SITE_OTHER): Payer: Medicare Other | Admitting: Family Medicine

## 2022-09-01 ENCOUNTER — Encounter: Payer: Self-pay | Admitting: Family Medicine

## 2022-09-01 VITALS — BP 119/77 | HR 113 | Ht 62.0 in | Wt 103.0 lb

## 2022-09-01 DIAGNOSIS — M47812 Spondylosis without myelopathy or radiculopathy, cervical region: Secondary | ICD-10-CM | POA: Diagnosis not present

## 2022-09-01 DIAGNOSIS — G44219 Episodic tension-type headache, not intractable: Secondary | ICD-10-CM | POA: Diagnosis not present

## 2022-09-01 DIAGNOSIS — G43009 Migraine without aura, not intractable, without status migrainosus: Secondary | ICD-10-CM

## 2022-09-01 MED ORDER — SUMATRIPTAN SUCCINATE 100 MG PO TABS: ORAL_TABLET | ORAL | 11 refills | Status: DC

## 2022-09-01 MED ORDER — IMIPRAMINE HCL 25 MG PO TABS: 75.0000 mg | ORAL_TABLET | Freq: Every day | ORAL | 3 refills | Status: DC

## 2022-09-01 MED ORDER — CYCLOBENZAPRINE HCL 5 MG PO TABS: 5.0000 mg | ORAL_TABLET | Freq: Two times a day (BID) | ORAL | 3 refills | Status: DC | PRN

## 2022-09-01 MED ORDER — LEVETIRACETAM 500 MG PO TABS: 500.0000 mg | ORAL_TABLET | Freq: Two times a day (BID) | ORAL | 3 refills | Status: DC

## 2022-09-01 MED ORDER — BUTALBITAL-APAP-CAFFEINE 50-325-40 MG PO TABS: ORAL_TABLET | ORAL | 2 refills | Status: DC

## 2022-09-03 ENCOUNTER — Other Ambulatory Visit (INDEPENDENT_AMBULATORY_CARE_PROVIDER_SITE_OTHER): Payer: Medicare Other

## 2022-09-03 DIAGNOSIS — D509 Iron deficiency anemia, unspecified: Secondary | ICD-10-CM | POA: Diagnosis not present

## 2022-09-03 LAB — CBC WITH DIFFERENTIAL/PLATELET
Basophils Absolute: 0 10*3/uL (ref 0.0–0.1)
Basophils Relative: 0.4 % (ref 0.0–3.0)
Eosinophils Absolute: 0.3 10*3/uL (ref 0.0–0.7)
Eosinophils Relative: 3.9 % (ref 0.0–5.0)
HCT: 38.6 % (ref 36.0–46.0)
Hemoglobin: 12.5 g/dL (ref 12.0–15.0)
Lymphocytes Relative: 24.5 % (ref 12.0–46.0)
Lymphs Abs: 1.8 10*3/uL (ref 0.7–4.0)
MCHC: 32.4 g/dL (ref 30.0–36.0)
MCV: 85.1 fl (ref 78.0–100.0)
Monocytes Absolute: 0.4 10*3/uL (ref 0.1–1.0)
Monocytes Relative: 6 % (ref 3.0–12.0)
Neutro Abs: 4.7 10*3/uL (ref 1.4–7.7)
Neutrophils Relative %: 65.2 % (ref 43.0–77.0)
Platelets: 343 10*3/uL (ref 150.0–400.0)
RBC: 4.54 Mil/uL (ref 3.87–5.11)
RDW: 28 % — ABNORMAL HIGH (ref 11.5–15.5)
WBC: 7.3 10*3/uL (ref 4.0–10.5)

## 2022-09-03 LAB — IBC + FERRITIN
Ferritin: 84.7 ng/mL (ref 10.0–291.0)
Iron: 53 ug/dL (ref 42–145)
Saturation Ratios: 16.7 % — ABNORMAL LOW (ref 20.0–50.0)
TIBC: 317.8 ug/dL (ref 250.0–450.0)
Transferrin: 227 mg/dL (ref 212.0–360.0)

## 2022-09-04 ENCOUNTER — Other Ambulatory Visit: Payer: Self-pay

## 2022-09-04 DIAGNOSIS — D509 Iron deficiency anemia, unspecified: Secondary | ICD-10-CM

## 2022-09-07 DIAGNOSIS — Z1339 Encounter for screening examination for other mental health and behavioral disorders: Secondary | ICD-10-CM | POA: Diagnosis not present

## 2022-09-07 DIAGNOSIS — R82998 Other abnormal findings in urine: Secondary | ICD-10-CM | POA: Diagnosis not present

## 2022-09-07 DIAGNOSIS — Z1331 Encounter for screening for depression: Secondary | ICD-10-CM | POA: Diagnosis not present

## 2022-09-07 DIAGNOSIS — K279 Peptic ulcer, site unspecified, unspecified as acute or chronic, without hemorrhage or perforation: Secondary | ICD-10-CM | POA: Diagnosis not present

## 2022-09-07 DIAGNOSIS — K222 Esophageal obstruction: Secondary | ICD-10-CM | POA: Diagnosis not present

## 2022-09-07 DIAGNOSIS — Z0000 Encounter for general adult medical examination without abnormal findings: Secondary | ICD-10-CM | POA: Diagnosis not present

## 2022-09-07 DIAGNOSIS — M810 Age-related osteoporosis without current pathological fracture: Secondary | ICD-10-CM | POA: Diagnosis not present

## 2022-09-07 DIAGNOSIS — I10 Essential (primary) hypertension: Secondary | ICD-10-CM | POA: Diagnosis not present

## 2022-09-07 DIAGNOSIS — K219 Gastro-esophageal reflux disease without esophagitis: Secondary | ICD-10-CM | POA: Diagnosis not present

## 2022-09-07 DIAGNOSIS — E7800 Pure hypercholesterolemia, unspecified: Secondary | ICD-10-CM | POA: Diagnosis not present

## 2022-09-07 DIAGNOSIS — G43909 Migraine, unspecified, not intractable, without status migrainosus: Secondary | ICD-10-CM | POA: Diagnosis not present

## 2022-09-09 ENCOUNTER — Telehealth: Payer: Self-pay | Admitting: Adult Health

## 2022-09-09 NOTE — Telephone Encounter (Signed)
Patient would like the nurse or doctor to call her regarding some test results that she saw on mychart.  She stated that she has some questions that she would like to ask.  CB# [phone]

## 2022-09-10 NOTE — Telephone Encounter (Signed)
ATC X1 LVM for patient to call the office back. Not showing patient has ever been seen in this clinic. Will get more info when patient call back

## 2022-09-17 ENCOUNTER — Other Ambulatory Visit: Payer: Self-pay | Admitting: Gastroenterology

## 2022-09-17 DIAGNOSIS — K222 Esophageal obstruction: Secondary | ICD-10-CM

## 2022-09-17 DIAGNOSIS — D509 Iron deficiency anemia, unspecified: Secondary | ICD-10-CM

## 2022-09-17 DIAGNOSIS — R1310 Dysphagia, unspecified: Secondary | ICD-10-CM

## 2022-09-17 DIAGNOSIS — K319 Disease of stomach and duodenum, unspecified: Secondary | ICD-10-CM

## 2022-09-28 ENCOUNTER — Ambulatory Visit: Payer: Medicare Other | Admitting: Family Medicine

## 2022-09-30 DIAGNOSIS — N200 Calculus of kidney: Secondary | ICD-10-CM | POA: Diagnosis not present

## 2022-09-30 DIAGNOSIS — N281 Cyst of kidney, acquired: Secondary | ICD-10-CM | POA: Diagnosis not present

## 2022-10-04 ENCOUNTER — Encounter: Payer: Self-pay | Admitting: Nurse Practitioner

## 2022-10-04 ENCOUNTER — Ambulatory Visit (INDEPENDENT_AMBULATORY_CARE_PROVIDER_SITE_OTHER): Payer: Medicare Other | Admitting: Nurse Practitioner

## 2022-10-04 VITALS — BP 112/66 | HR 107 | Ht 62.0 in | Wt 104.0 lb

## 2022-10-04 DIAGNOSIS — R1310 Dysphagia, unspecified: Secondary | ICD-10-CM | POA: Diagnosis not present

## 2022-10-04 DIAGNOSIS — D509 Iron deficiency anemia, unspecified: Secondary | ICD-10-CM | POA: Diagnosis not present

## 2022-10-04 NOTE — Progress Notes (Unsigned)
10/04/2022 Teresa Lozano [id number] 1950/08/29   Chief Complaint: Follow-up after EGD  History of Present Illness: Teresa Lozano is a 72 year old female with a past medical history of hypertension, hyperlipidemia, kidney stones, osteoporosis, arthritis, kidney stones, CKD stage 3a, colon polyps, GERD and PUD with severe gastric stenosis s/p distal gastrectomy with Billroth II reconstruction with vagotomy in 2018. I last saw the patient in office 07/01/2022 due to having recurrent dysphagia and she was also noted to be anemic per labs 02/2022 with a hemoglobin level of 10.2.  Labs 07/02/2022 showed a hemoglobin level of 9.3.  Hematocrit 30.0.  MCV 72.3.  Iron 24.  Ferritin 2.4.  TIBC 505.4.  She received IV Feraheme x 2 at our infusion center. She subsequently underwent an EGD by Dr. Havery Moros 07/08/2022 which showed severe benign-appearing esophageal stenosis at the GEJ which was dilated with the endoscope and a large amount of food residue was in the stomach.  The surgical anastomosis in her stomach was patent but friable and was likely the source of her anemia as noted by Dr. Havery Moros. A repeat EGD was done 07/30/2022 which showed a benign-appearing esophageal stenosis which was dilated to 12 mm, chronic inactive gastritis to the small bowel limb without evidence of H. pylori with patent Billroth II gastrojejunostomy and normal stomach remnant.  A colonoscopy was completed 08/23/2022 which showed a tortuous colon and 1 diminutive tubular adenomatous polyp was removed from the cecum.   She presents today for follow-up after completing both upper endoscopies and colonoscopy.  Reported feeling quite well.  She denies having any further dysphagia since her esophagus was dilated.  No heartburn.  No upper or lower abdominal pain.  No nausea or vomiting.  She is passing normal formed brown bowel movement once daily.  No rectal bleeding or black stools.  Her appetite is good and she reported gaining  a few pounds.  She remains on omeprazole 40 mg twice daily.  She is not taking Reglan.   PAST GI PROCEDURES:  Colonoscopy 08/23/2022: - One diminutive polyp in the cecum, removed with a cold snare. Resected and retrieved.  - Tortuous colon.  - The examination was otherwise normal. No cause for iron deficiency on this exam, which is more likely due to findings on endoscopy. -Consider a recall colonoscopy in 7 years, note her colon is narrowed and difficult to navigate with a colonoscope which is increases the patient's risk for procedure complications - TUBULAR ADENOMA. - NO HIGH GRADE DYSPLASIA OR MALIGNANCY.  EGD 07/30/2022: - Esophagogastric landmarks identified.  - Benign-appearing esophageal stenosis. Dilated to 62mm. Biopsied to open the stricture further.  - Normal esophagus otherwise  - Patent Billroth II gastrojejunostomy was found, characterized by friable mucosa.  - Normal stomach remnant otherwise - biopsied to rule out H pylori - Normal examined small bowel limb. - ANTRAL/OXYNTIC MUCOSA WITH FEATURES OF BOTH MILD TO MODERATE CHRONIC INACTIVE GASTRITIS AND CHEMICAL/REACTIVE CHANGE. - AN IMMUNOHISTOCHEMICAL STAIN FOR HELICOBACTER PYLORI ORGANISMS IS PENDING AND WILL BE REPORTED IN AN ADDENDUM.  EGD 07/08/2022: - Severe benign-appearing esophageal stenosis at the GEJ dilated with upper endoscope itself.  - Normal esophagus otherwise  - A large amount of food (residue) in the stomach. This prohibited much of any evaluation of the stomach but was able to see that at least part of the surgical anastomosis and it was patent, but quite friable.  - Minimally evaluated small bowel limb - not well seen due to food debrise Overall, limited  exam. Severe benign GEJ stricture is causing dysphagia, dilated with upper endoscope but retroflexed views of the stomach not possible due to residual food, could not clear that area.    EGD 11/09/2016: Esophagogastric landmarks identified. - Normal  esophagus - Some residual food noted in the stomach without fluid. - Severe gastric stenosis was found at the pylorus. Biopsied.   EGD 12/28/2016: - Esophagogastric landmarks identified. - Normal esophagus - Residual food in the proximal stomach. - Severe benign appearing gastric stenosis was found at the pylorus. Biopsied. Given the severity of the stenosis and angulated lumen noted on barium study, this lesion is high risk for balloon dilation and was not attempted. - Biopsies were taken with a cold forceps for Helicobacter pylori testing.   Colonoscopy 12/13/2018: - Preparation of the colon was poor. - Stool in the entire examined colon. - Procedure aborted   Colonoscopy 01/18/2019: - One 2 mm polyp in the sigmoid colon, removed with a cold biopsy forceps. Resected and retrieved. - Diverticulosis in the sigmoid colon. - Tortuous colon. - The examination was otherwise normal. - 10 year recall colonoscopy  Surgical [P], sigmoid, polyp - BENIGN COLONIC MUCOSA (1 OF 1 FRAGMENTS) - NO HIGH GRADE DYSPLASIA OR MALIGNANCY IDENTIFIED+   Current Outpatient Medications on File Prior to Visit  Medication Sig Dispense Refill   amLODipine (NORVASC) 10 MG tablet Take 10 mg by mouth daily.     butalbital-acetaminophen-caffeine (FIORICET) 50-325-40 MG tablet One to two tablets by mouth as needed for headache. No more than 4/week 16 tablet 2   cetirizine (ZYRTEC) 10 MG tablet Take 10 mg by mouth daily.     CRANBERRY PO Take 2 capsules by mouth daily.     cyclobenzaprine (FLEXERIL) 5 MG tablet Take 1 tablet (5 mg total) by mouth 2 (two) times daily as needed for muscle spasms. 180 tablet 3   imipramine (TOFRANIL) 25 MG tablet Take 3 tablets (75 mg total) by mouth at bedtime. 270 tablet 3   levETIRAcetam (KEPPRA) 500 MG tablet Take 1 tablet (500 mg total) by mouth 2 (two) times daily. 180 tablet 3   Magnesium 500 MG CAPS Take 500 mg by mouth 2 (two) times a week.      metoCLOPramide (REGLAN) 5 MG  tablet Take 1 tablet (5 mg total) by mouth 3 (three) times daily as needed for nausea. 30 tablet 1   Multiple Vitamin (MULTI-VITAMIN DAILY PO) Take 1 tablet by mouth daily.     omeprazole (PRILOSEC) 40 MG capsule Take 1 capsule (40 mg total) by mouth 2 (two) times daily. 180 capsule 1   ondansetron (ZOFRAN) 4 MG tablet Take 1 tablet (4 mg total) by mouth every 8 (eight) hours as needed. 20 tablet 5   polyethylene glycol (MIRALAX / GLYCOLAX) packet Take 17 g by mouth daily as needed for mild constipation.     rosuvastatin (CRESTOR) 10 MG tablet Take by mouth.     sucralfate (CARAFATE) 1 g tablet TAKE 1 TABLET (1 G TOTAL) BY MOUTH EVERY 6 (SIX) HOURS AS NEEDED. 90 tablet 1   SUMAtriptan (IMITREX) 100 MG tablet TAKE 1 TAB AS NEEDED FOR MIGRAINE. MAY REPEAT IN 2 HRS IF HEADACHE PERSISTS OR RECURS. 10 tablet 11   VITAMIN D PO Take 5,000 Units by mouth daily.     No current facility-administered medications on file prior to visit.   Allergies  Allergen Reactions   Robaxin [Methocarbamol] Nausea And Vomiting   Lisinopril Cough    Current Medications, Allergies,  Past Medical History, Past Surgical History, Family History and Social History were reviewed in [employer] record.  Review of Systems:   Constitutional: Negative for fever, sweats, chills or weight loss.  Respiratory: Negative for shortness of breath.   Cardiovascular: Negative for chest pain, palpitations and leg swelling.  Gastrointestinal: See HPI.  Musculoskeletal: Negative for back pain or muscle aches.  Neurological: Negative for dizziness, headaches or paresthesias.   Physical Exam: BP 112/66   Pulse (!) 107   Ht 5\' 2"  (1.575 m)   Wt 104 lb (47.2 kg)   BMI 19.02 kg/m  Wt Readings from Last 3 Encounters:  10/04/22 104 lb (47.2 kg)  09/01/22 103 lb (46.7 kg)  08/23/22 104 lb (47.2 kg)    General: 72 year old female in no acute distress. Head: Normocephalic and atraumatic. Eyes: No scleral icterus.  Conjunctiva pink . Ears: Normal auditory acuity. Mouth: Dentition intact. No ulcers or lesions.  Lungs: Clear throughout to auscultation. Heart: Regular rate and rhythm, no murmur. Abdomen: Soft, nontender and nondistended. No masses or hepatomegaly. Normal bowel sounds x 4 quadrants.  Rectal: Deferred.  Musculoskeletal: Symmetrical with no gross deformities. Extremities: No edema. Neurological: Alert oriented x 4. No focal deficits.  Psychological: Alert and cooperative. Normal mood and affect  Assessment and Recommendations:  72 year old female with a history of GERD/PUD with severe gastric stenosis s/p distal gastrectomy with Billroth II reconstruction with vagotomy in 2018 who developed GERD symptoms and dysphagia. Sister died from esophageal cancer. S/P EGD 07/08/2022 showed severe benign appearing esophageal stenosis at the GEJ which was dilated with the endoscope and a large amount of food residue was in the stomach which prohibited further evaluation of the stomach and the surgical anastomosis was patent but friable. A repeat EGD  was done 07/30/2022 which identified a benign-appearing esophageal stenosis which was dilated to 12 mm, chronic inactive gastritis and Billroth II gastrojejunostomy anatomy.  -Patient to contact our office if dysphagia recurs -Continue Omeprazole 40 mg twice daily   Iron deficiency anemia.  Received IV iron infusions x 2. IDA likely due to her friable surgical anastomosis in her stomach identified per EGD as noted above. -Repeat CBC and iron panel in 1 month -Follow-up with Dr. Havery Moros in 4 months  Tubular adenomatous colon polyp removed from the cecum per colonoscopy 08/23/2022 -Consider a repeat colonoscopy in 7 years if medically appropriate at that time as the patient will be 72 years old, also note her colon is narrowed and difficult to navigate with a colonoscope which increases the patient's risk for procedure complications  Today's encounter was 25  minutes which included precharting, chart/result review, history/exam, face-to-face time used for counseling, formulating a treatment plan with follow-up and documentation.

## 2022-10-04 NOTE — Patient Instructions (Addendum)
Return to our lab in one month to have a repeat CBC and iron panel   Contact our office if your difficulty swallowing recurs   Follow up in office in 4 months and as needed.  Thank you for trusting me with your gastrointestinal care!   Carl Best, CRNP

## 2022-10-05 ENCOUNTER — Encounter: Payer: Self-pay | Admitting: Nurse Practitioner

## 2022-10-05 NOTE — Progress Notes (Signed)
Agree with assessment and plan as outlined.  

## 2022-10-13 DIAGNOSIS — Z7689 Persons encountering health services in other specified circumstances: Secondary | ICD-10-CM | POA: Diagnosis not present

## 2022-10-13 DIAGNOSIS — Z1231 Encounter for screening mammogram for malignant neoplasm of breast: Secondary | ICD-10-CM | POA: Diagnosis not present

## 2022-10-25 ENCOUNTER — Telehealth: Payer: Self-pay

## 2022-10-25 NOTE — Telephone Encounter (Signed)
-----   Message from Cooper Render, CMA sent at 09/04/2022  9:13 AM EST ----- Regarding: due for labs in April Due for CBC and IBC/Ferritin in mid april

## 2022-10-25 NOTE — Telephone Encounter (Signed)
Called and left detailed message for patient that she is due for labs one day next week

## 2022-10-26 NOTE — Telephone Encounter (Signed)
Letter mailed and sent to My Chart

## 2022-11-04 ENCOUNTER — Other Ambulatory Visit (INDEPENDENT_AMBULATORY_CARE_PROVIDER_SITE_OTHER): Payer: Medicare Other

## 2022-11-04 DIAGNOSIS — D509 Iron deficiency anemia, unspecified: Secondary | ICD-10-CM | POA: Diagnosis not present

## 2022-11-04 LAB — CBC WITH DIFFERENTIAL/PLATELET
Basophils Absolute: 0 10*3/uL (ref 0.0–0.1)
Basophils Relative: 0.4 % (ref 0.0–3.0)
Eosinophils Absolute: 0.2 10*3/uL (ref 0.0–0.7)
Eosinophils Relative: 1.8 % (ref 0.0–5.0)
HCT: 39.3 % (ref 36.0–46.0)
Hemoglobin: 13 g/dL (ref 12.0–15.0)
Lymphocytes Relative: 22.2 % (ref 12.0–46.0)
Lymphs Abs: 1.9 10*3/uL (ref 0.7–4.0)
MCHC: 33 g/dL (ref 30.0–36.0)
MCV: 92.7 fl (ref 78.0–100.0)
Monocytes Absolute: 0.5 10*3/uL (ref 0.1–1.0)
Monocytes Relative: 5.4 % (ref 3.0–12.0)
Neutro Abs: 6 10*3/uL (ref 1.4–7.7)
Neutrophils Relative %: 70.2 % (ref 43.0–77.0)
Platelets: 236 10*3/uL (ref 150.0–400.0)
RBC: 4.24 Mil/uL (ref 3.87–5.11)
RDW: 13.9 % (ref 11.5–15.5)
WBC: 8.6 10*3/uL (ref 4.0–10.5)

## 2022-11-04 LAB — IBC + FERRITIN
Ferritin: 28.8 ng/mL (ref 10.0–291.0)
Iron: 61 ug/dL (ref 42–145)
Saturation Ratios: 20.6 % (ref 20.0–50.0)
TIBC: 295.4 ug/dL (ref 250.0–450.0)
Transferrin: 211 mg/dL — ABNORMAL LOW (ref 212.0–360.0)

## 2022-11-05 ENCOUNTER — Other Ambulatory Visit: Payer: Self-pay | Admitting: Gastroenterology

## 2022-11-05 DIAGNOSIS — K319 Disease of stomach and duodenum, unspecified: Secondary | ICD-10-CM

## 2022-11-05 DIAGNOSIS — D509 Iron deficiency anemia, unspecified: Secondary | ICD-10-CM

## 2022-11-05 DIAGNOSIS — R1310 Dysphagia, unspecified: Secondary | ICD-10-CM

## 2022-11-05 DIAGNOSIS — K222 Esophageal obstruction: Secondary | ICD-10-CM

## 2022-11-18 ENCOUNTER — Ambulatory Visit: Payer: Medicare Other | Admitting: Family Medicine

## 2022-11-22 ENCOUNTER — Other Ambulatory Visit: Payer: Self-pay | Admitting: Nurse Practitioner

## 2022-11-22 NOTE — Telephone Encounter (Signed)
Please advise 

## 2022-12-06 ENCOUNTER — Other Ambulatory Visit: Payer: Self-pay | Admitting: Family Medicine

## 2022-12-06 MED ORDER — BUTALBITAL-APAP-CAFFEINE 50-325-40 MG PO TABS: ORAL_TABLET | ORAL | 2 refills | Status: DC

## 2022-12-06 NOTE — Telephone Encounter (Signed)
Pt last seen 09/01/22 and next f/u 09/05/23. Last refilled Fioricet 10/16/22 #16.

## 2022-12-06 NOTE — Telephone Encounter (Signed)
Pt requesting a refill on butalbital-acetaminophen-caffeine (FIORICET) 50-325-40 MG tablet. Should be sent to [id number] - [place], 

## 2023-02-04 ENCOUNTER — Ambulatory Visit (INDEPENDENT_AMBULATORY_CARE_PROVIDER_SITE_OTHER): Payer: Medicare Other | Admitting: Gastroenterology

## 2023-02-04 ENCOUNTER — Encounter: Payer: Self-pay | Admitting: Gastroenterology

## 2023-02-04 VITALS — BP 122/68 | HR 107 | Ht 62.0 in | Wt 102.4 lb

## 2023-02-04 DIAGNOSIS — K219 Gastro-esophageal reflux disease without esophagitis: Secondary | ICD-10-CM | POA: Diagnosis not present

## 2023-02-04 DIAGNOSIS — K319 Disease of stomach and duodenum, unspecified: Secondary | ICD-10-CM

## 2023-02-04 DIAGNOSIS — K222 Esophageal obstruction: Secondary | ICD-10-CM | POA: Diagnosis not present

## 2023-02-04 DIAGNOSIS — Z980 Intestinal bypass and anastomosis status: Secondary | ICD-10-CM | POA: Diagnosis not present

## 2023-02-04 DIAGNOSIS — R1310 Dysphagia, unspecified: Secondary | ICD-10-CM

## 2023-02-04 DIAGNOSIS — Z79899 Other long term (current) drug therapy: Secondary | ICD-10-CM

## 2023-02-04 DIAGNOSIS — D509 Iron deficiency anemia, unspecified: Secondary | ICD-10-CM | POA: Diagnosis not present

## 2023-02-04 MED ORDER — SUCRALFATE 1 G PO TABS: 1.0000 g | ORAL_TABLET | ORAL | Status: DC | PRN

## 2023-02-04 MED ORDER — OMEPRAZOLE 40 MG PO CPDR: 40.0000 mg | DELAYED_RELEASE_CAPSULE | Freq: Every day | ORAL | Status: DC

## 2023-02-04 MED ORDER — FERROUS SULFATE 325 (65 FE) MG PO TABS: 325.0000 mg | ORAL_TABLET | Freq: Every day | ORAL | 3 refills | Status: DC

## 2023-02-04 NOTE — Patient Instructions (Addendum)
We have sent the following medications to your pharmacy for you to pick up at your convenience: Ferrous sulfate 325 mg: Take once daily  Decrease omeprazole to once daily  Take Carafate as needed  Please go to the lab in the basement of our building to have lab work done as you leave today. Hit "B" for basement when you get on the elevator.  When the doors open the lab is on your left.  We will call you with the results. Thank you.  Thank you for entrusting me with your care and for choosing [facility], Dr. Ileene Patrick   If your blood pressure at your visit was 140/90 or greater, please contact your primary care physician to follow up on this. ______________________________________________________  If you are age 51 or older, your body mass index should be between 23-30. Your Body mass index is 18.73 kg/m. If this is out of the aforementioned range listed, please consider follow up with your Primary Care Provider.  If you are age 21 or younger, your body mass index should be between 19-25. Your Body mass index is 18.73 kg/m. If this is out of the aformentioned range listed, please consider follow up with your Primary Care Provider.  ________________________________________________________  The [facility] providers would like to encourage you to use [facility] to communicate with providers for non-urgent requests or questions.  Due to long hold times on the telephone, sending your provider a message by [facility] may be a faster and more efficient way to get a response.  Please allow 48 business hours for a response.  Please remember that this is for non-urgent requests.  _______________________________________________________  Due to recent changes in healthcare laws, you may see the results of your imaging and laboratory studies on MyChart before your provider has had a chance to review them.  We understand that in some cases there may be results that are confusing or concerning to  you. Not all laboratory results come back in the same time frame and the provider may be waiting for multiple results in order to interpret others.  Please give [place] 48 hours in order for your provider to thoroughly review all the results before contacting the office for clarification of your results.

## 2023-02-04 NOTE — Progress Notes (Signed)
HPI :  72 y/o female here for a follow up visit for IDA, history of Bilroth II with vagotomy, dysphagia secondary to esophageal stricture, osteoporosis.   Recall she was seen 07/01/2022 due to having recurrent dysphagia and she was also noted to be anemic per labs 02/2022 with a hemoglobin level of 10.2.  Labs 07/02/2022 showed a hemoglobin level of 9.3.  Hematocrit 30.0.  MCV 72.3.  Iron 24.  Ferritin 2.4.  TIBC 505.4.  She received IV Feraheme x 2 at our infusion center.  She had an EGD with me 07/08/2022 which showed severe benign-appearing esophageal stenosis at the GEJ which was dilated with the endoscope and a large amount of food residue was in the stomach.  The surgical anastomosis in her stomach was patent but friable and was likely the source of her anemia. Repeat EGD was done 07/30/2022 which showed a benign-appearing esophageal stenosis which was dilated to 12 mm, chronic inactive gastritis to the small bowel limb without evidence of H. pylori with patent Billroth II gastrojejunostomy and normal stomach remnant.  A colonoscopy was completed 08/23/2022 which showed a tortuous colon and 1 diminutive tubular adenomatous polyp was removed from the cecum.    She has been doing really well since I have last seen her. She eats well. Has very seldom dysphagia, significantly improved post dilation, she eats what she wants and is not limited by it. She is taking omeprazole 40mg  BID and states she has been on this dose for some time. If she does not take it she will have reflux / dyspepsia which bothers her. Recall she does have osteoporosis and on Reclast.  She is eating well. Denies any nausea or vomiting. No abdominal pains. No blood in her stools. She has been taking carafate once daily but no symptoms lately to prompt use of it otherwise.  She responded well to IV iron. CBC and iron studies were normal as of last April. She is not taking any oral iron supplementation and has not tried that. We used  IV iron initially given severity of her IDA / anemia.   Of note, she previously was on both Zofran and Reglan for nausea, she has stopped using these, does not need them. Denies any problems with routine nausea or vomiting.   PAST GI PROCEDURES:   Colonoscopy 08/23/2022: - One diminutive polyp in the cecum, removed with a cold snare. Resected and retrieved.  - Tortuous colon.  - The examination was otherwise normal. No cause for iron deficiency on this exam, which is more likely due to findings on endoscopy. -Consider a recall colonoscopy in 7 years, note her colon is narrowed and difficult to navigate with a colonoscope which is increases the patient's risk for procedure complications - TUBULAR ADENOMA. - NO HIGH GRADE DYSPLASIA OR MALIGNANCY.   EGD 07/30/2022: - Esophagogastric landmarks identified.  - Benign-appearing esophageal stenosis. Dilated to 12mm. Biopsied to open the stricture further.  - Normal esophagus otherwise  - Patent Billroth II gastrojejunostomy was found, characterized by friable mucosa.  - Normal stomach remnant otherwise - biopsied to rule out H pylori - Normal examined small bowel limb. - ANTRAL/OXYNTIC MUCOSA WITH FEATURES OF BOTH MILD TO MODERATE CHRONIC INACTIVE GASTRITIS AND CHEMICAL/REACTIVE CHANGE. - AN IMMUNOHISTOCHEMICAL STAIN FOR HELICOBACTER PYLORI ORGANISMS IS PENDING AND WILL BE REPORTED IN AN ADDENDUM.   EGD 07/08/2022: - Severe benign-appearing esophageal stenosis at the GEJ dilated with upper endoscope itself.  - Normal esophagus otherwise  - A large amount of  food (residue) in the stomach. This prohibited much of any evaluation of the stomach but was able to see that at least part of the surgical anastomosis and it was patent, but quite friable.  - Minimally evaluated small bowel limb - not well seen due to food debrise Overall, limited exam. Severe benign GEJ stricture is causing dysphagia, dilated with upper endoscope but retroflexed views of the  stomach not possible due to residual food, could not clear that area.    EGD 11/09/2016: Esophagogastric landmarks identified. - Normal esophagus - Some residual food noted in the stomach without fluid. - Severe gastric stenosis was found at the pylorus. Biopsied.   EGD 12/28/2016: - Esophagogastric landmarks identified. - Normal esophagus - Residual food in the proximal stomach. - Severe benign appearing gastric stenosis was found at the pylorus. Biopsied. Given the severity of the stenosis and angulated lumen noted on barium study, this lesion is high risk for balloon dilation and was not attempted. - Biopsies were taken with a cold forceps for Helicobacter pylori testing.   Colonoscopy 12/13/2018: - Preparation of the colon was poor. - Stool in the entire examined colon. - Procedure aborted   Colonoscopy 01/18/2019: - One 2 mm polyp in the sigmoid colon, removed with a cold biopsy forceps. Resected and retrieved. - Diverticulosis in the sigmoid colon. - Tortuous colon. - The examination was otherwise normal. - 10 year recall colonoscopy  Surgical [P], sigmoid, polyp - BENIGN COLONIC MUCOSA (1 OF 1 FRAGMENTS) - NO HIGH GRADE DYSPLASIA OR MALIGNANCY IDENTIFIED+         Past Medical History:  Diagnosis Date   Allergy    Arthritis    Chicken pox    Frequent headaches    GERD (gastroesophageal reflux disease)    History of kidney stones    Hyperlipidemia    Hypertension    IDA (iron deficiency anemia)    Osteoporosis      Past Surgical History:  Procedure Laterality Date   CERVICAL FUSION  2000   COLONOSCOPY  01/18/2019   COLONOSCOPY WITH PROPOFOL  08/23/2022   ESOPHAGOGASTRODUODENOSCOPY  11/2016   ESOPHAGOGASTRODUODENOSCOPY (EGD) WITH PROPOFOL N/A 12/28/2016   Procedure: ESOPHAGOGASTRODUODENOSCOPY (EGD) WITH PROPOFOL;  Surgeon: Ruffin Frederick, MD;  Location: [facility];  Service: Gastroenterology;  Laterality: N/A;   EYE SURGERY     STOMACH SURGERY   04/2017   REMOVED ULCER   TONSILLECTOMY     UPPER GASTROINTESTINAL ENDOSCOPY  07/2022   Family History  Problem Relation Age of Onset   Hyperlipidemia Mother    Hypertension Mother    Prostate cancer Father    Hyperlipidemia Father    Heart disease Father    Heart disease Brother    Hypertension Sister    Esophageal cancer Sister    Hypertension Maternal Grandmother    Colon cancer Neg Hx    Colon polyps Neg Hx    Stomach cancer Neg Hx    Rectal cancer Neg Hx    Social History   Tobacco Use   Smoking status: Never   Smokeless tobacco: Never  Vaping Use   Vaping status: Never Used  Substance Use Topics   Alcohol use: No    Alcohol/week: 0.0 standard drinks of alcohol   Drug use: No   Current Outpatient Medications  Medication Sig Dispense Refill   amLODipine (NORVASC) 10 MG tablet Take 10 mg by mouth daily.     butalbital-acetaminophen-caffeine (FIORICET) 50-325-40 MG tablet One to two tablets by mouth as needed  for headache. No more than 4/week 16 tablet 2   cetirizine (ZYRTEC) 10 MG tablet Take 10 mg by mouth daily.     CRANBERRY PO Take 2 capsules by mouth daily.     cyclobenzaprine (FLEXERIL) 5 MG tablet Take 1 tablet (5 mg total) by mouth 2 (two) times daily as needed for muscle spasms. 180 tablet 3   imipramine (TOFRANIL) 25 MG tablet Take 3 tablets (75 mg total) by mouth at bedtime. 270 tablet 3   levETIRAcetam (KEPPRA) 500 MG tablet Take 1 tablet (500 mg total) by mouth 2 (two) times daily. 180 tablet 3   Magnesium 500 MG CAPS Take 500 mg by mouth 2 (two) times a week.      metoCLOPramide (REGLAN) 5 MG tablet Take 1 tablet (5 mg total) by mouth 3 (three) times daily as needed for nausea. 30 tablet 1   Multiple Vitamin (MULTI-VITAMIN DAILY PO) Take 1 tablet by mouth daily.     omeprazole (PRILOSEC) 40 MG capsule TAKE 1 CAPSULE BY MOUTH TWICE A DAY 180 capsule 1   ondansetron (ZOFRAN) 4 MG tablet Take 1 tablet (4 mg total) by mouth every 8 (eight) hours as needed.  20 tablet 5   polyethylene glycol (MIRALAX / GLYCOLAX) packet Take 17 g by mouth daily as needed for mild constipation.     rosuvastatin (CRESTOR) 10 MG tablet Take by mouth.     sucralfate (CARAFATE) 1 g tablet TAKE 1 TABLET (1 G TOTAL) BY MOUTH EVERY 6 (SIX) HOURS AS NEEDED. 90 tablet 1   SUMAtriptan (IMITREX) 100 MG tablet TAKE 1 TAB AS NEEDED FOR MIGRAINE. MAY REPEAT IN 2 HRS IF HEADACHE PERSISTS OR RECURS. 10 tablet 11   VITAMIN D PO Take 5,000 Units by mouth daily.     No current facility-administered medications for this visit.   Allergies  Allergen Reactions   Robaxin [Methocarbamol] Nausea And Vomiting   Lisinopril Cough     Review of Systems: All systems reviewed and negative except where noted in HPI.   Lab Results  Component Value Date   WBC 8.6 11/04/2022   HGB 13.0 11/04/2022   HCT 39.3 11/04/2022   MCV 92.7 11/04/2022   PLT 236.0 11/04/2022    Lab Results  Component Value Date   NA 140 07/02/2022   CL 105 07/02/2022   K 3.5 07/02/2022   CO2 26 07/02/2022   BUN 17 07/02/2022   CREATININE 0.89 07/02/2022   GFR 65.78 07/02/2022   CALCIUM 9.1 07/02/2022   ALBUMIN 4.2 07/02/2022   GLUCOSE 101 (H) 07/02/2022    Lab Results  Component Value Date   ALT 15 07/02/2022   AST 19 07/02/2022   ALKPHOS 74 07/02/2022   BILITOT 0.2 07/02/2022    Lab Results  Component Value Date   IRON 61 11/04/2022   TIBC 295.4 11/04/2022   FERRITIN 28.8 11/04/2022       Latest Ref Rng & Units 11/04/2022    1:41 PM 09/03/2022    1:27 PM 07/30/2022   11:27 AM  CBC  WBC 4.0 - 10.5 K/uL 8.6  7.3  3.4   Hemoglobin 12.0 - 15.0 g/dL 82.9  56.2  13.0   Hematocrit 36.0 - 46.0 % 39.3  38.6  34.7   Platelets 150.0 - 400.0 K/uL 236.0  343.0  228.0      Physical Exam: BP 122/68   Pulse (!) 107   Ht 5\' 2"  (1.575 m)   Wt 102 lb 6.4 oz (46.4  kg)   SpO2 95%   BMI 18.73 kg/m  Constitutional: Pleasant,well-developed, female in no acute distress. Neurological: Alert and  oriented to person place and time. Psychiatric: Normal mood and affect. Behavior is normal.   ASSESSMENT: 72 y.o. female here for assessment of the following  1. Iron deficiency anemia, unspecified iron deficiency anemia type   2. Dysphagia, unspecified type   3. Gastroesophageal reflux disease, unspecified whether esophagitis present   4. History of Billroth II operation    Colonoscopy without clear cause. Her surgical anastomosis on EGDs has been friable in the past and I think the cause of IDA. She has done well on BID PPI, carafate, and IV iron, CBC and iron studies have normalized.  We discussed long term risks of chronic PPI use. She does have osteoporosis on Reclast, we discussed that PPIs can increase her fracture risk. I would like to dose reduce her omeprazole if she tolerates it. Will try to lower to 40mg  / day from 40mg  BID first, and see how she does. If recurrent or bothersome symptoms she can resume BID dosing. Otherwise, can stop routine use of Carafate and use only PRN. Holding routine use of Zofran / Reglan, her nausea is significantly improved since we previously saw her.   Otherwise, dysphagia significantly improved / resolved. If she has recurrence that bothers her more routinely she will let us know, can repeat EGD with dilation if needed in the future.  Finally, reviewed prior colonoscopy with her which was a challenging exam. Given no high risk lesions and difficulty of her case, may forgo future surveillance exams but can discuss it at that time with her (she will be 72 years old when next due)  PLAN: - repeat TIBC / ferritin and CBC today - suspect she will need some iron supplementation long term, start oral ferrous sulfate 325mg  / day (was only previously on IV iron) - dysphagia mostly resolved with dilation - she can follow up PRN for repeat EGD with dilation - discussed long term risks of chronic PPI use - she has osteoporosis, high fracture risk, will reduce  omeprazole to once daily if she tolerates. Can increase to BID If needed, she understands risks of chronic PPIs - use carafate PRN, stop routine use - Zofran / Reglan PRN for nausea, currently not taking - likely no further colonoscopy exams given difficulty of her last exam  Harlin Rain, MD [facility]

## 2023-02-16 ENCOUNTER — Other Ambulatory Visit (INDEPENDENT_AMBULATORY_CARE_PROVIDER_SITE_OTHER): Payer: Medicare Other

## 2023-02-16 DIAGNOSIS — K219 Gastro-esophageal reflux disease without esophagitis: Secondary | ICD-10-CM

## 2023-02-16 DIAGNOSIS — Z79899 Other long term (current) drug therapy: Secondary | ICD-10-CM | POA: Diagnosis not present

## 2023-02-16 DIAGNOSIS — R1310 Dysphagia, unspecified: Secondary | ICD-10-CM

## 2023-02-16 DIAGNOSIS — Z980 Intestinal bypass and anastomosis status: Secondary | ICD-10-CM | POA: Diagnosis not present

## 2023-02-16 DIAGNOSIS — K222 Esophageal obstruction: Secondary | ICD-10-CM

## 2023-02-16 DIAGNOSIS — D509 Iron deficiency anemia, unspecified: Secondary | ICD-10-CM

## 2023-02-16 DIAGNOSIS — K319 Disease of stomach and duodenum, unspecified: Secondary | ICD-10-CM

## 2023-02-16 LAB — CBC WITH DIFFERENTIAL/PLATELET
Basophils Absolute: 0.1 10*3/uL (ref 0.0–0.1)
Basophils Relative: 1 % (ref 0.0–3.0)
Eosinophils Absolute: 0.2 10*3/uL (ref 0.0–0.7)
Eosinophils Relative: 3.8 % (ref 0.0–5.0)
HCT: 42.7 % (ref 36.0–46.0)
Hemoglobin: 13.8 g/dL (ref 12.0–15.0)
Lymphocytes Relative: 27.6 % (ref 12.0–46.0)
Lymphs Abs: 1.7 10*3/uL (ref 0.7–4.0)
MCHC: 32.3 g/dL (ref 30.0–36.0)
MCV: 93.5 fl (ref 78.0–100.0)
Monocytes Absolute: 0.4 10*3/uL (ref 0.1–1.0)
Monocytes Relative: 6.6 % (ref 3.0–12.0)
Neutro Abs: 3.6 10*3/uL (ref 1.4–7.7)
Neutrophils Relative %: 61 % (ref 43.0–77.0)
Platelets: 281 10*3/uL (ref 150.0–400.0)
RBC: 4.57 Mil/uL (ref 3.87–5.11)
RDW: 13 % (ref 11.5–15.5)
WBC: 6 10*3/uL (ref 4.0–10.5)

## 2023-02-16 LAB — IBC + FERRITIN
Ferritin: 15.3 ng/mL (ref 10.0–291.0)
Iron: 53 ug/dL (ref 42–145)
Saturation Ratios: 14.8 % — ABNORMAL LOW (ref 20.0–50.0)
TIBC: 358.4 ug/dL (ref 250.0–450.0)
Transferrin: 256 mg/dL (ref 212.0–360.0)

## 2023-02-28 DIAGNOSIS — I129 Hypertensive chronic kidney disease with stage 1 through stage 4 chronic kidney disease, or unspecified chronic kidney disease: Secondary | ICD-10-CM | POA: Diagnosis not present

## 2023-02-28 DIAGNOSIS — N1831 Chronic kidney disease, stage 3a: Secondary | ICD-10-CM | POA: Diagnosis not present

## 2023-02-28 DIAGNOSIS — Z87442 Personal history of urinary calculi: Secondary | ICD-10-CM | POA: Diagnosis not present

## 2023-03-03 ENCOUNTER — Other Ambulatory Visit: Payer: Self-pay | Admitting: Family Medicine

## 2023-03-03 MED ORDER — BUTALBITAL-APAP-CAFFEINE 50-325-40 MG PO TABS: ORAL_TABLET | ORAL | 2 refills | Status: DC

## 2023-03-03 NOTE — Telephone Encounter (Signed)
Dr,Yan you are back up MD, Amy is out of the office  Pt last seen on 09/01/22 Follow up scheduled on 09/05/23 Last filled on 01/22/23 #16 (30 day supply) Rx pending to be signed

## 2023-03-03 NOTE — Telephone Encounter (Signed)
Pt is requesting a refill for butalbital-acetaminophen-caffeine (FIORICET) 50-325-40 MG tablet .  Pharmacy: [facility] [phone]

## 2023-03-07 DIAGNOSIS — N1831 Chronic kidney disease, stage 3a: Secondary | ICD-10-CM | POA: Diagnosis not present

## 2023-03-07 DIAGNOSIS — I129 Hypertensive chronic kidney disease with stage 1 through stage 4 chronic kidney disease, or unspecified chronic kidney disease: Secondary | ICD-10-CM | POA: Diagnosis not present

## 2023-03-07 DIAGNOSIS — Z87442 Personal history of urinary calculi: Secondary | ICD-10-CM | POA: Diagnosis not present

## 2023-03-07 DIAGNOSIS — I10 Essential (primary) hypertension: Secondary | ICD-10-CM | POA: Diagnosis not present

## 2023-03-10 ENCOUNTER — Encounter: Payer: Self-pay | Admitting: Family Medicine

## 2023-04-04 DIAGNOSIS — M810 Age-related osteoporosis without current pathological fracture: Secondary | ICD-10-CM | POA: Diagnosis not present

## 2023-04-22 ENCOUNTER — Other Ambulatory Visit (HOSPITAL_COMMUNITY): Payer: Self-pay | Admitting: *Deleted

## 2023-04-25 ENCOUNTER — Ambulatory Visit (HOSPITAL_COMMUNITY)
Admission: RE | Admit: 2023-04-25 | Discharge: 2023-04-25 | Disposition: A | Discharge status: Home / Self Care | Payer: Medicare Other | Source: Ambulatory Visit | Attending: Internal Medicine | Admitting: Internal Medicine

## 2023-04-25 DIAGNOSIS — M810 Age-related osteoporosis without current pathological fracture: Secondary | ICD-10-CM | POA: Diagnosis not present

## 2023-04-25 DIAGNOSIS — Z7983 Long term (current) use of bisphosphonates: Secondary | ICD-10-CM | POA: Diagnosis not present

## 2023-04-25 MED ORDER — ZOLEDRONIC ACID 5 MG/100ML IV SOLN
5.0000 mg | Freq: Once | INTRAVENOUS | Status: AC
  Administered 2023-04-25: 5 mg via INTRAVENOUS

## 2023-04-25 MED ORDER — ZOLEDRONIC ACID 5 MG/100ML IV SOLN
INTRAVENOUS | Status: AC
  Filled 2023-04-25: qty 100

## 2023-05-05 ENCOUNTER — Other Ambulatory Visit: Payer: Self-pay | Admitting: Nurse Practitioner

## 2023-05-05 ENCOUNTER — Other Ambulatory Visit: Payer: Self-pay | Admitting: Neurology

## 2023-05-05 ENCOUNTER — Other Ambulatory Visit: Payer: Self-pay | Admitting: Gastroenterology

## 2023-05-05 NOTE — Telephone Encounter (Signed)
  Dispensed Days Supply Quantity Provider Pharmacy   BUTALB-ACETAMIN-CAFF [phone] 04/19/2023 28 16 each Levert Feinstein, MD [facility] [phone] - [facility]...  BUTALB-ACETAMIN-CAFF 50-325-40 03/27/2023 28 16 each Levert Feinstein, MD [facility] [phone] - [facility]...  BUTALB-ACETAMIN-CAFF [phone] 03/03/2023 28 16 each Levert Feinstein, MD [facility] [phone] - W...  BUTALB-ACETAMIN-CAFF 50-325-40 01/22/2023 30 16 each Shawnie Dapper, NP [facility] [phone] - [facility]...  BUTALB-ACETAMIN-CAFF 50-325-40 12/29/2022 30 16 each Shawnie Dapper, NP [facility] [phone] - W...  BUTALB-ACETAMIN-CAFF 50-325-40 12/06/2022 30 16 each Shawnie Dapper, NP [facility] [phone] - [facility]...  BUTALB-ACETAMIN-CAFF 50-325-40 10/16/2022 28 16 each Shawnie Dapper, NP [facility] [phone] - W...  BUTALB-ACETAMIN-CAFF 50-325-40 09/24/2022 28 16 each Shawnie Dapper, NP [facility] [phone] - W...  BUTALB-ACETAMIN-CAFF 50-325-40 09/01/2022 28 16 each Shawnie Dapper, NP [facility] [phone] - W...  BUTALB-ACETAMIN-CAFF 50-325-40 07/23/2022 30 16 each Shawnie Dapper, NP [facility] [phone] - W...  BUTALB-ACETAMIN-CAFF 50-325-40 06/30/2022 30 16 each Shawnie Dapper, NP [facility] [phone] - W...  BUTALB-ACETAMIN-CAFF 50-325-40 06/07/2022 30 16 each Shawnie Dapper, NP [facility] [phone] - W...  BUTALB-ACETAMIN-CAFF 50-325-40 05/15/2022 30 16 each Shawnie Dapper, NP [facility] [phone] - W...       Last visit 09/01/22 Next visit 09/26/23

## 2023-05-17 DIAGNOSIS — Z23 Encounter for immunization: Secondary | ICD-10-CM | POA: Diagnosis not present

## 2023-05-27 DIAGNOSIS — L97521 Non-pressure chronic ulcer of other part of left foot limited to breakdown of skin: Secondary | ICD-10-CM | POA: Diagnosis not present

## 2023-05-27 DIAGNOSIS — M79675 Pain in left toe(s): Secondary | ICD-10-CM | POA: Diagnosis not present

## 2023-05-27 DIAGNOSIS — R350 Frequency of micturition: Secondary | ICD-10-CM | POA: Diagnosis not present

## 2023-05-30 ENCOUNTER — Encounter: Payer: Self-pay | Admitting: Gastroenterology

## 2023-05-31 ENCOUNTER — Encounter: Payer: Self-pay | Admitting: Podiatry

## 2023-05-31 ENCOUNTER — Ambulatory Visit: Payer: Medicare Other

## 2023-05-31 ENCOUNTER — Ambulatory Visit (INDEPENDENT_AMBULATORY_CARE_PROVIDER_SITE_OTHER): Payer: Medicare Other | Admitting: Podiatry

## 2023-05-31 DIAGNOSIS — M2042 Other hammer toe(s) (acquired), left foot: Secondary | ICD-10-CM | POA: Diagnosis not present

## 2023-05-31 DIAGNOSIS — M6740 Ganglion, unspecified site: Secondary | ICD-10-CM

## 2023-05-31 NOTE — Progress Notes (Signed)
Chief Complaint  Patient presents with   Toe Pain    4th toe left - appeared to have started like a blister back in the summer, wasn't sore, went for pedicure in Sept and the girl broke it open, now its really tender   New Patient (Initial Visit)    HPI: 72 y.o. female presents today for evaluation of left 4th toe lesion.  She patient denies any specific injury to the area when this first began.  She states that she first thought that it was a blister over the summer.  She states that in September she got a pedicure and the mass ruptured and drained some clear fluid. Lesion has remained present as small central scabbed area with mild callus tissue and is tender to touch.  She denies any nausea, vomiting, fever, chills, chest pain, shortness of breath.  She denies any history of diabetes or any autoimmune diseases.  Past Medical History:  Diagnosis Date   Allergy    Arthritis    Chicken pox    Frequent headaches    GERD (gastroesophageal reflux disease)    History of kidney stones    Hyperlipidemia    Hypertension    IDA (iron deficiency anemia)    Osteoporosis     Past Surgical History:  Procedure Laterality Date   CERVICAL FUSION  2000   COLONOSCOPY  01/18/2019   COLONOSCOPY WITH PROPOFOL  08/23/2022   ESOPHAGOGASTRODUODENOSCOPY  11/2016   ESOPHAGOGASTRODUODENOSCOPY (EGD) WITH PROPOFOL N/A 12/28/2016   Procedure: ESOPHAGOGASTRODUODENOSCOPY (EGD) WITH PROPOFOL;  Surgeon: Ruffin Frederick, MD;  Location: [facility];  Service: Gastroenterology;  Laterality: N/A;   EYE SURGERY     STOMACH SURGERY  04/2017   REMOVED ULCER   TONSILLECTOMY     UPPER GASTROINTESTINAL ENDOSCOPY  07/2022    Allergies  Allergen Reactions   Robaxin [Methocarbamol] Nausea And Vomiting   Lisinopril Cough    ROS -negative except as stated in HPI   Physical Exam: There were no vitals filed for this visit.  General: The patient is alert and oriented x3 in no acute  distress.  Dermatology: Skin is warm, dry and supple bilateral lower extremities. Interspaces are clear of maceration and debris.  Left dorsal fourth toe proximal to the nail plate focal area of inflammation and tenderness with pinpoint area of well adhered scab and hyperkeratotic tissue.  Mild associated edema.  No surrounding erythema.  No focal warmth increased.  No drainage.  No fluctuance.  Vascular: Palpable pedal pulses bilaterally. Capillary refill within normal limits.  No appreciable edema.  No erythema or calor.  Neurological: Light touch sensation grossly intact bilateral feet.   Musculoskeletal Exam: Mild overall asymptomatic adductovarus rotation of 4th and 5th toes. No Pain with 4th toe PIPJ or DIPJ range of motion  Radiographic Exam:  Normal osseous mineralization. No acute fractures appreciated. Some degenerative change is present to the left 4th DIPJ medial aspect, some subtle spur formation.  Assessment/Plan of Care: 1. Hammer toe of left foot   2. Mucoid cyst of joint      No orders of the defined types were placed in this encounter.  None  Discussed clinical findings with patient today.  # Suspected mucoid cyst of left fourth toe associated with hammertoe, fourth DIPJ arthritis -Clinical findings and treatment plan discussed with patient - We will try to manage this with topical steroids to see if this improves the patient's pain for now. We will start with over  the counter hydrocortisone cream and bandaid twice a day for two weeks.  -Triamcinolone cream and Band-Aid applied today -Gel toe cap dispensed to protect the tender lesion from rubbing -Advised patient manage her symptoms do not improve or worsen over the next couple days she should return to clinic and we can discuss surgical excision of the lesion.  Question of whether or not underlying joint pathology is present which would necessitate arthroplasty or partial ostectomy of the 4th DIPJ is  present.   Shelanda Duvall L. Marchia Bond, AACFAS [facility]     [address] [place], [place] [address]                Office [phone]  Fax [phone]

## 2023-05-31 NOTE — Patient Instructions (Incomplete)
Below is our plan:  We will continue current treatment plan. We will try prednisone taper with food and your omeprazole. Do not take Nsaids. We will send PT referral for dry needling and strengthening exercises.   Please make sure you are staying well hydrated. I recommend 50-60 ounces daily. Well balanced diet and regular exercise encouraged. Consistent sleep schedule with 6-8 hours recommended.   Please continue follow up with care team as directed.   Follow up with me as planned in March. May move back if doing well.   You may receive a survey regarding today's visit. I encourage you to leave honest feed back as I do use this information to improve patient care. Thank you for seeing me today!   GENERAL HEADACHE INFORMATION:   Natural supplements: Magnesium Oxide or Magnesium Glycinate 500 mg at bed (up to 800 mg daily) Coenzyme Q10 300 mg in AM Vitamin B2- 200 mg twice a day   Add 1 supplement at a time since even natural supplements can have undesirable side effects. You can sometimes buy supplements cheaper (especially Coenzyme Q10) at www.WebmailGuide.co.za or at [facility].  Migraine with aura: There is increased risk for stroke in women with migraine with aura and a contraindication for the combined contraceptive pill for use by women who have migraine with aura. The risk for women with migraine without aura is lower. However other risk factors like smoking are far more likely to increase stroke risk than migraine. There is a recommendation for no smoking and for the use of OCPs without estrogen such as progestogen only pills particularly for women with migraine with aura.Marland Kitchen People who have migraine headaches with auras may be 3 times more likely to have a stroke caused by a blood clot, compared to migraine patients who don't see auras. Women who take hormone-replacement therapy may be 30 percent more likely to suffer a clot-based stroke than women not taking medication containing estrogen. Other  risk factors like smoking and high blood pressure may be  much more important.    Vitamins and herbs that show potential:   Magnesium: Magnesium (250 mg twice a day or 500 mg at bed) has a relaxant effect on smooth muscles such as blood vessels. Individuals suffering from frequent or daily headache usually have low magnesium levels which can be increase with daily supplementation of 400-750 mg. Three trials found 40-90% average headache reduction  when used as a preventative. Magnesium may help with headaches are aura, the best evidence for magnesium is for migraine with aura is its thought to stop the cortical spreading depression we believe is the pathophysiology of migraine aura.Magnesium also demonstrated the benefit in menstrually related migraine.  Magnesium is part of the messenger system in the serotonin cascade and it is a good muscle relaxant.  It is also useful for constipation which can be a side effect of other medications used to treat migraine. Good sources include nuts, whole grains, and tomatoes. Side Effects: loose stool/diarrhea  Riboflavin (vitamin B 2) 200 mg twice a day. This vitamin assists nerve cells in the production of ATP a principal energy storing molecule.  It is necessary for many chemical reactions in the body.  There have been at least 3 clinical trials of riboflavin using 400 mg per day all of which suggested that migraine frequency can be decreased.  All 3 trials showed significant improvement in over half of migraine sufferers.  The supplement is found in bread, cereal, milk, meat, and poultry.  Most Americans  get more riboflavin than the recommended daily allowance, however riboflavin deficiency is not necessary for the supplements to help prevent headache. Side effects: energizing, green urine   Coenzyme Q10: This is present in almost all cells in the body and is critical component for the conversion of energy.  Recent studies have shown that a nutritional supplement of  CoQ10 can reduce the frequency of migraine attacks by improving the energy production of cells as with riboflavin.  Doses of 150 mg twice a day have been shown to be effective.   Melatonin: Increasing evidence shows correlation between melatonin secretion and headache conditions.  Melatonin supplementation has decreased headache intensity and duration.  It is widely used as a sleep aid.  Sleep is natures way of dealing with migraine.  A dose of 3 mg is recommended to start for headaches including cluster headache. Higher doses up to 15 mg has been reviewed for use in Cluster headache and have been used. The rationale behind using melatonin for cluster is that many theories regarding the cause of Cluster headache center around the disruption of the normal circadian rhythm in the brain.  This helps restore the normal circadian rhythm.   HEADACHE DIET: Foods and beverages which may trigger migraine Note that only 20% of headache patients are food sensitive. You will know if you are food sensitive if you get a headache consistently 20 minutes to 2 hours after eating a certain food. Only cut out a food if it causes headaches, otherwise you might remove foods you enjoy! What matters most for diet is to eat a well balanced healthy diet full of vegetables and low fat protein, and to not miss meals.   Chocolate, other sweets ALL cheeses except cottage and cream cheese Dairy products, yogurt, sour cream, ice cream Liver Meat extracts (Bovril, Marmite, meat tenderizers) Meats or fish which have undergone aging, fermenting, pickling or smoking. These include: Hotdogs,salami,Lox,sausage, mortadellas,smoked salmon, pepperoni, Pickled herring Pods of broad bean (English beans, Chinese pea pods, [place] (fava) beans, lima and navy beans Ripe avocado, ripe banana Yeast extracts or active yeast preparations such as Brewer's or Fleishman's (commercial bakes goods are permitted) Tomato based foods, pizza (lasagna,  etc.)   MSG (monosodium glutamate) is disguised as many things; look for these common aliases: Monopotassium glutamate Autolysed yeast Hydrolysed protein Sodium caseinate "flavorings" "all natural preservatives" Nutrasweet   Avoid all other foods that convincingly provoke headaches.   Resources: The Dizzy [place]air Laundry Your Headache Diet, migrainestrong.com  https://zamora-andrews.com/   Caffeine and Migraine For patients that have migraine, caffeine intake more than 3 days per week can lead to dependency and increased migraine frequency. I would recommend cutting back on your caffeine intake as best you can. The recommended amount of caffeine is 200-300 mg daily, although migraine patients may experience dependency at even lower doses. While you may notice an increase in headache temporarily, cutting back will be helpful for headaches in the long run. For more information on caffeine and migraine, visit: https://americanmigrainefoundation.org/resource-library/caffeine-and-migraine/   Headache Prevention Strategies:   1. Maintain a headache diary; learn to identify and avoid triggers.  - This can be a simple note where you log when you had a headache, associated symptoms, and medications used - There are several smartphone apps developed to help track migraines: Migraine Buddy, Migraine Monitor, Curelator N1-Headache App   Common triggers include: Emotional triggers: Emotional/Upset family or friends Emotional/Upset occupation Business reversal/success Anticipation anxiety Crisis-serious Post-crisis periodNew job/position   Physical triggers: Vacation Day Weekend Strenuous  Exercise High Altitude Location New Move Menstrual Day Physical Illness Oversleep/Not enough sleep Weather changes Light: Photophobia or light sesnitivity treatment involves a balance between desensitization and reduction in overly strong input. Use dark  polarized glasses outside, but not inside. Avoid bright or fluorescent light, but do not dim environment to the point that going into a normally lit room hurts. Consider FL-41 tint lenses, which reduce the most irritating wavelengths without blocking too much light.  These can be obtained at axonoptics.com or theraspecs.com Foods: see list above.   2. Limit use of acute treatments (over-the-counter medications, triptans, etc.) to no more than 2 days per week or 10 days per month to prevent medication overuse headache (rebound headache).     3. Follow a regular schedule (including weekends and holidays): Don't skip meals. Eat a balanced diet. 8 hours of sleep nightly. Minimize stress. Exercise 30 minutes per day. Being overweight is associated with a 5 times increased risk of chronic migraine. Keep well hydrated and drink 6-8 glasses of water per day.   4. Initiate non-pharmacologic measures at the earliest onset of your headache. Rest and quiet environment. Relax and reduce stress. Breathe2Relax is a free app that can instruct you on    some simple relaxtion and breathing techniques. Http://Dawnbuse.com is a    free website that provides teaching videos on relaxation.  Also, there are  many apps that   can be downloaded for "mindful" relaxation.  An app called YOGA NIDRA will help walk you through mindfulness. Another app called Calm can be downloaded to give you a structured mindfulness guide with daily reminders and skill development. Headspace for guided meditation Mindfulness Based Stress Reduction Online Course: www.palousemindfulness.com Cold compresses.   5. Don't wait!! Take the maximum allowable dosage of prescribed medication at the first sign of migraine.   6. Compliance:  Take prescribed medication regularly as directed and at the first sign of a migraine.   7. Communicate:  Call your physician when problems arise, especially if your headaches change, increase in frequency/severity,  or become associated with neurological symptoms (weakness, numbness, slurred speech, etc.). Proceed to emergency room if you experience new or worsening symptoms or symptoms do not resolve, if you have new neurologic symptoms or if headache is severe, or for any concerning symptom.   8. Headache/pain management therapies: Consider various complementary methods, including medication, behavioral therapy, psychological counselling, biofeedback, massage therapy, acupuncture, dry needling, and other modalities.  Such measures may reduce the need for medications. Counseling for pain management, where patients learn to function and ignore/minimize their pain, seems to work very well.   9. Recommend changing family's attention and focus away from patient's headaches. Instead, emphasize daily activities. If first question of day is 'How are your headaches/Do you have a headache today?', then patient will constantly think about headaches, thus making them worse. Goal is to re-direct attention away from headaches, toward daily activities and other distractions.   10. Helpful Websites: www.AmericanHeadacheSociety.org PatentHood.ch www.headaches.org TightMarket.nl www.achenet.org

## 2023-05-31 NOTE — Patient Instructions (Signed)
Apply over the counter hydrocortisone cream to the lesion on left 4th toe twice a day for two weeks. Protect with bandaid and toe cap. Return to clinic if symptoms fail to improve or worsen.

## 2023-05-31 NOTE — Progress Notes (Unsigned)
No chief complaint on file.   HISTORY OF PRESENT ILLNESS:  05/31/23 ALL:  Teresa Lozano returns for follow up for headaches. She was last seen 08/2022 and doing fairly well on imipramine 75mg  daily and levetiracetam 500mg  BID. Butalbital, sumatriptan and cyclobenzaprine continued as needed.  09/01/2022 ALL:  Teresa Lozano returns for follow up for headaches. She was last seen 03/2022 and continued to have regular tension style headaches and neck pain. We continued imipramine 75mg  QHS and levetiracetam 500mg  BID. We added cyclobenzaprine 5mg  at bedtime. Butalbital, sumatriptan and ondansetron continued as needed. She called 05/2022 and reported it was helping but requested to increase to BID. Since, she reports headaches are significantly better. She is tolerating meds with no adverse effects. She also reports being diagnosed with anemia. She received two iron transfusions and feels that this has helped. She is followed closely by GI and PCP. Now she estimates 4-8 headache days a month with 0-1 migraines per month. Taking butalbital 1-2 times a week. Sumatriptan about once per month.   03/30/2022 ALL: Teresa Lozano returns for follow up for headaches. She was last seen 09/2021 and we continued imipramine 75mg  QHS and levetiracetam 500mg  BID but added gabapentin 300mg  QHS for continued tension style headaches. Since, she reports no improvement. She took gabapentin for about 4 months but did not note any improvement in headaches or neck pain. She continues to have near daily headaches. She wakes with headaches 2-3 times a week. She has 2-3 migrainous headaches a month easily aborted with sumatriptan. Occasionally uses ondansetron for nausea. She is taking butalbital 4 times a week and Excedrin often. She has difficulty staying asleep 3-4 days a week. She is unsure why she wakes up but is not able to go back to sleep. She does snore, worse on her back. She denies dry mouth or EDS. She denies family history of sleep apnea. She  reports shooting pain in hands and feet had improved over the summer but are back. No significant worsening. No weakness.   Tried and failed: topiramate (contraindicated for kidney stones), propranolol (edema), amitriptyline (ineffective), imipramine (on now), levetiracetam (on now), sumatriptan (works well)  09/23/2021 ALL: Teresa Lozano returns for follow up for migraines. She continued levetiracetam and imipramine and added Emgality at last visit 03/2021. Injections were approved but copay was 500. She feels migraines are very well managed. She uses sumatriptan rarely but feels it is helpful. She continues to have daily tension headaches. Pain starts in the neck. She is s/p cervical fusion. She is taking butalbital about every other day.   03/26/2021 ALL:  Teresa Lozano returns for follow up for headaches. She added propranolol 20mg  BID at last visit and continued imipramine 75mg  QHS and levetiracetam 500mg  BID. Sumatriptan and butalbital help with abortive therapy. She has about 8-12 headache days with 4-5 migraines per month. 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 She usually takes 4-5 sumatriptan and 16 butalbital every month.   Tried and failed: topiramate (contraindicated for kidney stones), propranolol (edema), amitriptyline (ineffective), imipramine (on now), levetiracetam (on now)  09/23/2020 ALL:  Teresa Lozano is a 72 y.o. female here today for follow up for headaches. She continues imipramine 75mg  QHS and levetiracetam 500mg  BID for tension style and migraine prevention. She rarely takes Sumatriptan but it works for migraine abortion and Fioricet for tension headaches. She is having to take at least 3-4 doses every week. Medications work fairly well. She feels  neck pain is stable. She has noted a higher heart rate for the past year. She denies chest pain or palpitations. No dizziness or lightheadedness. She does feel  winded with working in her yard. She denies history of asthma. She does not drink much water but does like tea.    HISTORY (copied from Dr Bonnita Hollow previous note)  Teresa Lozano is a 72 y.o. woman who has had headaches for 40 years.     Update 03/26/2020: She notes migraine headaches once a month but tension headaches are more frequently -once a week.   Most headaches are occipital and radiate forward.    She continues on imipramine 75 mg nightly and Keppra 500 mg p.o. twice daily.    She tolerates the medications well.   For migraines she takes sumatriptan and usually has a benefit.  For occipitla headaches she takes fioricet or  OTC Excedrin, often with benefit.     She had kidney stones so can't use topiramate or zonisamide.    She notes shooting pain in her fingers and toes.   This is better than last year.    Hand numbness often involves the ring fingers.     The 4th and 5th toes are more involved than the rest of the foot.   She has pain left > right.   NCV/EMG was normal last year.   She does not note weakness in the hand hands or feet.  She has some neck pain and back pain.   DATA CT has shown severe left facet changes at [facility] in 2011 and much milder facet hypertrophy to the right at [facility]. She has ACDF at C6C7 (Dr. Yetta Barre 2011)   REVIEW OF SYSTEMS: Out of a complete 14 system review of symptoms, the patient complains only of the following symptoms, headaches, neck pain, elevated heart rate and all other reviewed systems are negative.   ALLERGIES: Allergies  Allergen Reactions   Robaxin [Methocarbamol] Nausea And Vomiting   Lisinopril Cough     HOME MEDICATIONS: Outpatient Medications Prior to Visit  Medication Sig Dispense Refill   amLODipine (NORVASC) 10 MG tablet Take 10 mg by mouth daily.     butalbital-acetaminophen-caffeine (FIORICET) 50-325-40 MG tablet One to two tablets by mouth as needed for headache. No more than 4/week 16 tablet 2   cetirizine (ZYRTEC) 10 MG tablet  Take 10 mg by mouth daily.     CRANBERRY PO Take 2 capsules by mouth daily.     cyclobenzaprine (FLEXERIL) 5 MG tablet Take 1 tablet (5 mg total) by mouth 2 (two) times daily as needed for muscle spasms. 180 tablet 3   ferrous sulfate 325 (65 FE) MG tablet TAKE 1 TABLET BY MOUTH EVERY DAY WITH BREAKFAST 90 tablet 1   imipramine (TOFRANIL) 25 MG tablet Take 3 tablets (75 mg total) by mouth at bedtime. 270 tablet 3   levETIRAcetam (KEPPRA) 500 MG tablet Take 1 tablet (500 mg total) by mouth 2 (two) times daily. 180 tablet 3   Magnesium 500 MG CAPS Take 500 mg by mouth 2 (two) times a week.      metoCLOPramide (REGLAN) 5 MG tablet Take 1 tablet (5 mg total) by mouth 3 (three) times daily as needed for nausea. 30 tablet 1   Multiple Vitamin (MULTI-VITAMIN DAILY PO) Take 1 tablet by mouth daily.     omeprazole (PRILOSEC) 40 MG capsule TAKE 1 CAPSULE BY MOUTH TWICE A DAY 180 capsule 1   ondansetron (ZOFRAN) 4 MG tablet Take  1 tablet (4 mg total) by mouth every 8 (eight) hours as needed. 20 tablet 5   polyethylene glycol (MIRALAX / GLYCOLAX) packet Take 17 g by mouth daily as needed for mild constipation.     rosuvastatin (CRESTOR) 10 MG tablet Take by mouth.     sucralfate (CARAFATE) 1 g tablet Take 1 tablet (1 g total) by mouth as needed.     SUMAtriptan (IMITREX) 100 MG tablet TAKE 1 TAB AS NEEDED FOR MIGRAINE. MAY REPEAT IN 2 HRS IF HEADACHE PERSISTS OR RECURS. 10 tablet 11   VITAMIN D PO Take 5,000 Units by mouth daily.     No facility-administered medications prior to visit.     PAST MEDICAL HISTORY: Past Medical History:  Diagnosis Date   Allergy    Arthritis    Chicken pox    Frequent headaches    GERD (gastroesophageal reflux disease)    History of kidney stones    Hyperlipidemia    Hypertension    IDA (iron deficiency anemia)    Osteoporosis      PAST SURGICAL HISTORY: Past Surgical History:  Procedure Laterality Date   CERVICAL FUSION  2000   COLONOSCOPY  01/18/2019    COLONOSCOPY WITH PROPOFOL  08/23/2022   ESOPHAGOGASTRODUODENOSCOPY  11/2016   ESOPHAGOGASTRODUODENOSCOPY (EGD) WITH PROPOFOL N/A 12/28/2016   Procedure: ESOPHAGOGASTRODUODENOSCOPY (EGD) WITH PROPOFOL;  Surgeon: Ruffin Frederick, MD;  Location: [facility];  Service: Gastroenterology;  Laterality: N/A;   EYE SURGERY     STOMACH SURGERY  04/2017   REMOVED ULCER   TONSILLECTOMY     UPPER GASTROINTESTINAL ENDOSCOPY  07/2022     FAMILY HISTORY: Family History  Problem Relation Age of Onset   Hyperlipidemia Mother    Hypertension Mother    Prostate cancer Father    Hyperlipidemia Father    Heart disease Father    Heart disease Brother    Hypertension Sister    Esophageal cancer Sister    Hypertension Maternal Grandmother    Colon cancer Neg Hx    Colon polyps Neg Hx    Stomach cancer Neg Hx    Rectal cancer Neg Hx      SOCIAL HISTORY: Social History   Socioeconomic History   Marital status: Widowed    Spouse name: Not on file   Number of children: Not on file   Years of education: Not on file   Highest education level: Not on file  Occupational History   Not on file  Tobacco Use   Smoking status: Never   Smokeless tobacco: Never  Vaping Use   Vaping status: Never Used  Substance and Sexual Activity   Alcohol use: No    Alcohol/week: 0.0 standard drinks of alcohol   Drug use: No   Sexual activity: Not Currently  Other Topics Concern   Not on file  Social History Narrative   Not on file   Social Determinants of Health   Financial Resource Strain: Not on file  Food Insecurity: Not on file  Transportation Needs: Not on file  Physical Activity: Not on file  Stress: Not on file  Social Connections: Not on file  Intimate Partner Violence: Not on file      PHYSICAL EXAM  There were no vitals filed for this visit.      There is no height or weight on file to calculate BMI.   Generalized: Well developed, in no acute distress  Cardiology:  normal rate and rhythm, no murmur auscultated  Respiratory: clear  to auscultation bilaterally    Neurological examination  Mentation: Alert oriented to time, place, history taking. Follows all commands speech and language fluent Cranial nerve II-XII: Pupils were equal round reactive to light. Extraocular movements were full, visual field were full on confrontational test. Facial sensation and strength were normal. Head turning and shoulder shrug  were normal and symmetric. Motor: The motor testing reveals 5 over 5 strength of all 4 extremities. Good symmetric motor tone is noted throughout.  Gait and station: Gait is normal.     DIAGNOSTIC DATA (LABS, IMAGING, TESTING) - I reviewed patient records, labs, notes, testing and imaging myself where available.  Lab Results  Component Value Date   WBC 6.0 02/16/2023   HGB 13.8 02/16/2023   HCT 42.7 02/16/2023   MCV 93.5 02/16/2023   PLT 281.0 02/16/2023      Component Value Date/Time   NA 140 07/02/2022 1501   K 3.5 07/02/2022 1501   CL 105 07/02/2022 1501   CO2 26 07/02/2022 1501   GLUCOSE 101 (H) 07/02/2022 1501   BUN 17 07/02/2022 1501   CREATININE 0.89 07/02/2022 1501   CALCIUM 9.1 07/02/2022 1501   PROT 6.5 07/02/2022 1501   ALBUMIN 4.2 07/02/2022 1501   AST 19 07/02/2022 1501   ALT 15 07/02/2022 1501   ALKPHOS 74 07/02/2022 1501   BILITOT 0.2 07/02/2022 1501   GFRNONAA 54 (L) 07/24/2021 0452   GFRAA 47 (L) 03/20/2020 1021   Lab Results  Component Value Date   CHOL 168 04/05/2019   HDL 55.10 04/05/2019   LDLCALC 85 04/05/2019   TRIG 140.0 04/05/2019   CHOLHDL 3 04/05/2019   Lab Results  Component Value Date   HGBA1C 5.2 12/24/2019   Lab Results  Component Value Date   VITAMINB12 703 07/02/2022   No results found for: "TSH"      No data to display               No data to display           ASSESSMENT AND PLAN  72 y.o. year old female  has a past medical history of Allergy, Arthritis, Chicken  pox, Frequent headaches, GERD (gastroesophageal reflux disease), History of kidney stones, Hyperlipidemia, Hypertension, IDA (iron deficiency anemia), and Osteoporosis. here with   Common migraine without intractability  Episodic tension-type headache, not intractable  She is doing well, today. Headaches have significantly improved. She will continue imipramine 75mg  daily and levetiracetam 500mg  BID.She may continue butalbital, sumatriptan and cyclobenzaprine as needed.  Healthy lifestyle habits encouraged. I have encouraged her to limit use of butalbital and Excedrin. She will continue close follow up with PCP and care team. She will follow up in 1 year, sooner if needed.    No orders of the defined types were placed in this encounter.    No orders of the defined types were placed in this encounter.    Shawnie Dapper, MSN, FNP-C 05/31/2023, 12:29 PM  [facility] [address] [place], [place] [address] [phone]

## 2023-06-02 ENCOUNTER — Encounter: Payer: Self-pay | Admitting: Family Medicine

## 2023-06-02 ENCOUNTER — Ambulatory Visit (INDEPENDENT_AMBULATORY_CARE_PROVIDER_SITE_OTHER): Payer: Medicare Other | Admitting: Family Medicine

## 2023-06-02 ENCOUNTER — Telehealth: Payer: Self-pay | Admitting: Family Medicine

## 2023-06-02 VITALS — BP 126/70 | HR 97 | Ht 62.0 in | Wt 101.0 lb

## 2023-06-02 DIAGNOSIS — G44219 Episodic tension-type headache, not intractable: Secondary | ICD-10-CM | POA: Diagnosis not present

## 2023-06-02 DIAGNOSIS — G43009 Migraine without aura, not intractable, without status migrainosus: Secondary | ICD-10-CM | POA: Diagnosis not present

## 2023-06-02 DIAGNOSIS — M542 Cervicalgia: Secondary | ICD-10-CM | POA: Diagnosis not present

## 2023-06-02 DIAGNOSIS — Z9889 Other specified postprocedural states: Secondary | ICD-10-CM

## 2023-06-02 DIAGNOSIS — G8928 Other chronic postprocedural pain: Secondary | ICD-10-CM | POA: Diagnosis not present

## 2023-06-02 MED ORDER — SUMATRIPTAN SUCCINATE 100 MG PO TABS: ORAL_TABLET | ORAL | 11 refills | Status: DC

## 2023-06-02 MED ORDER — LEVETIRACETAM 500 MG PO TABS: 500.0000 mg | ORAL_TABLET | Freq: Two times a day (BID) | ORAL | 3 refills | Status: DC

## 2023-06-02 MED ORDER — PREDNISONE 10 MG (21) PO TBPK: ORAL_TABLET | ORAL | 0 refills | Status: DC

## 2023-06-02 MED ORDER — IMIPRAMINE HCL 25 MG PO TABS: 75.0000 mg | ORAL_TABLET | Freq: Every day | ORAL | 3 refills | Status: DC

## 2023-06-02 MED ORDER — CYCLOBENZAPRINE HCL 5 MG PO TABS: 5.0000 mg | ORAL_TABLET | Freq: Two times a day (BID) | ORAL | 3 refills | Status: DC | PRN

## 2023-06-02 MED ORDER — BUTALBITAL-APAP-CAFFEINE 50-325-40 MG PO TABS: ORAL_TABLET | ORAL | 2 refills | Status: DC

## 2023-06-02 NOTE — Telephone Encounter (Signed)
 Referral for physical therapy fax to [facility]. Phone: [phone], Fax: [phone].

## 2023-06-03 ENCOUNTER — Encounter: Payer: Self-pay | Admitting: Gastroenterology

## 2023-06-03 ENCOUNTER — Ambulatory Visit (INDEPENDENT_AMBULATORY_CARE_PROVIDER_SITE_OTHER): Payer: Medicare Other | Admitting: Gastroenterology

## 2023-06-03 VITALS — BP 102/58 | HR 61 | Ht 62.0 in | Wt 101.1 lb

## 2023-06-03 DIAGNOSIS — K219 Gastro-esophageal reflux disease without esophagitis: Secondary | ICD-10-CM

## 2023-06-03 DIAGNOSIS — R1310 Dysphagia, unspecified: Secondary | ICD-10-CM

## 2023-06-03 NOTE — Progress Notes (Signed)
Agree with assessment and plan as outlined.  

## 2023-06-03 NOTE — Progress Notes (Signed)
06/03/2023 Teresa Lozano [id number] [birth year]   HISTORY OF PRESENT ILLNESS:  This is a 72 year old female who is a patient of Dr. Lanetta Inch.  She follows here for issues related to IDA, history of Billroth II with vagotomy, and dysphagia secondary to esophageal stricture.  Has osteoporosis.  Here with complaints of recurrent dysphagia.  Has history of esophageal stricture.  Was dilated to 12 mm in January and responded well to that, but now having symptoms again over the past few weeks.  Was down to omeprazole 40 mg daily for a few months, but increased back to twice daily for about the past month or so.  Is now using the Carafate consistently twice a day recently, but no improvement in symptoms.  Dysphagia is not occurring daily at this point, but definitely recurring and she would like to get EGD with dilation repeated.  Says that she is having burning in her throat.  Different foods and sometimes even liquids seem to get stuck.  Colonoscopy 08/23/2022: - One diminutive polyp in the cecum, removed with a cold snare. Resected and retrieved.  - Tortuous colon.  - The examination was otherwise normal. No cause for iron deficiency on this exam, which is more likely due to findings on endoscopy. -Consider a recall colonoscopy in 7 years, note her colon is narrowed and difficult to navigate with a colonoscope which is increases the patient's risk for procedure complications - TUBULAR ADENOMA. - NO HIGH GRADE DYSPLASIA OR MALIGNANCY.   EGD 07/30/2022: - Esophagogastric landmarks identified.  - Benign-appearing esophageal stenosis. Dilated to 12mm. Biopsied to open the stricture further.  - Normal esophagus otherwise  - Patent Billroth II gastrojejunostomy was found, characterized by friable mucosa.  - Normal stomach remnant otherwise - biopsied to rule out H pylori - Normal examined small bowel limb. - ANTRAL/OXYNTIC MUCOSA WITH FEATURES OF BOTH MILD TO MODERATE CHRONIC INACTIVE  GASTRITIS AND CHEMICAL/REACTIVE CHANGE. - AN IMMUNOHISTOCHEMICAL STAIN FOR HELICOBACTER PYLORI ORGANISMS IS PENDING AND WILL BE REPORTED IN AN ADDENDUM.   EGD 07/08/2022: - Severe benign-appearing esophageal stenosis at the GEJ dilated with upper endoscope itself.  - Normal esophagus otherwise  - A large amount of food (residue) in the stomach. This prohibited much of any evaluation of the stomach but was able to see that at least part of the surgical anastomosis and it was patent, but quite friable.  - Minimally evaluated small bowel limb - not well seen due to food debrise Overall, limited exam. Severe benign GEJ stricture is causing dysphagia, dilated with upper endoscope but retroflexed views of the stomach not possible due to residual food, could not clear that area.    EGD 11/09/2016: Esophagogastric landmarks identified. - Normal esophagus - Some residual food noted in the stomach without fluid. - Severe gastric stenosis was found at the pylorus. Biopsied.   EGD 12/28/2016: - Esophagogastric landmarks identified. - Normal esophagus - Residual food in the proximal stomach. - Severe benign appearing gastric stenosis was found at the pylorus. Biopsied. Given the severity of the stenosis and angulated lumen noted on barium study, this lesion is high risk for balloon dilation and was not attempted. - Biopsies were taken with a cold forceps for Helicobacter pylori testing.   Colonoscopy 12/13/2018: - Preparation of the colon was poor. - Stool in the entire examined colon. - Procedure aborted   Colonoscopy 01/18/2019: - One 2 mm polyp in the sigmoid colon, removed with a cold biopsy forceps. Resected and retrieved. - Diverticulosis in  the sigmoid colon. - Tortuous colon. - The examination was otherwise normal. - 10 year recall colonoscopy  Surgical [P], sigmoid, polyp - BENIGN COLONIC MUCOSA (1 OF 1 FRAGMENTS) - NO HIGH GRADE DYSPLASIA OR MALIGNANCY IDENTIFIED+            Past Medical History:  Diagnosis Date   Allergy    Arthritis    Chicken pox    Frequent headaches    GERD (gastroesophageal reflux disease)    History of kidney stones    Hyperlipidemia    Hypertension    IDA (iron deficiency anemia)    Osteoporosis    Past Surgical History:  Procedure Laterality Date   CERVICAL FUSION  2000   COLONOSCOPY  01/18/2019   COLONOSCOPY WITH PROPOFOL  08/23/2022   ESOPHAGOGASTRODUODENOSCOPY  11/2016   ESOPHAGOGASTRODUODENOSCOPY (EGD) WITH PROPOFOL N/A 12/28/2016   Procedure: ESOPHAGOGASTRODUODENOSCOPY (EGD) WITH PROPOFOL;  Surgeon: Ruffin Frederick, MD;  Location: [facility];  Service: Gastroenterology;  Laterality: N/A;   EYE SURGERY     STOMACH SURGERY  04/2017   REMOVED ULCER   TONSILLECTOMY     UPPER GASTROINTESTINAL ENDOSCOPY  07/2022    reports that she has never smoked. She has never used smokeless tobacco. She reports that she does not drink alcohol and does not use drugs. family history includes Esophageal cancer in her sister; Heart disease in her brother and father; Hyperlipidemia in her father and mother; Hypertension in her maternal grandmother, mother, and sister; Prostate cancer in her father. Allergies  Allergen Reactions   Robaxin [Methocarbamol] Nausea And Vomiting   Lisinopril Cough      Outpatient Encounter Medications as of 06/03/2023  Medication Sig   amLODipine (NORVASC) 10 MG tablet Take 10 mg by mouth daily.   butalbital-acetaminophen-caffeine (FIORICET) 50-325-40 MG tablet One to two tablets by mouth as needed for headache. No more than 4/week   cetirizine (ZYRTEC) 10 MG tablet Take 10 mg by mouth daily.   CRANBERRY PO Take 2 capsules by mouth daily.   cyclobenzaprine (FLEXERIL) 5 MG tablet Take 1 tablet (5 mg total) by mouth 2 (two) times daily as needed for muscle spasms.   ferrous sulfate 325 (65 FE) MG tablet TAKE 1 TABLET BY MOUTH EVERY DAY WITH BREAKFAST   imipramine (TOFRANIL) 25 MG tablet Take 3  tablets (75 mg total) by mouth at bedtime.   levETIRAcetam (KEPPRA) 500 MG tablet Take 1 tablet (500 mg total) by mouth 2 (two) times daily.   Magnesium 500 MG CAPS Take 500 mg by mouth 2 (two) times a week.    metoCLOPramide (REGLAN) 5 MG tablet Take 1 tablet (5 mg total) by mouth 3 (three) times daily as needed for nausea.   Multiple Vitamin (MULTI-VITAMIN DAILY PO) Take 1 tablet by mouth daily.   omeprazole (PRILOSEC) 40 MG capsule TAKE 1 CAPSULE BY MOUTH TWICE A DAY   ondansetron (ZOFRAN) 4 MG tablet Take 1 tablet (4 mg total) by mouth every 8 (eight) hours as needed.   polyethylene glycol (MIRALAX / GLYCOLAX) packet Take 17 g by mouth daily as needed for mild constipation.   predniSONE (STERAPRED UNI-PAK 21 TAB) 10 MG (21) TBPK tablet Take as directed with food   rosuvastatin (CRESTOR) 10 MG tablet Take by mouth.   sucralfate (CARAFATE) 1 g tablet Take 1 tablet (1 g total) by mouth as needed.   SUMAtriptan (IMITREX) 100 MG tablet TAKE 1 TAB AS NEEDED FOR MIGRAINE. MAY REPEAT IN 2 HRS IF HEADACHE PERSISTS OR RECURS.  VITAMIN D PO Take 5,000 Units by mouth daily.   No facility-administered encounter medications on file as of 06/03/2023.    REVIEW OF SYSTEMS  : All other systems reviewed and negative except where noted in the History of Present Illness.   PHYSICAL EXAM: BP (!) 102/58   Pulse 61   Ht 5\' 2"  (1.575 m)   Wt 101 lb 2 oz (45.9 kg)   BMI 18.50 kg/m  General: Well developed white female in no acute distress Head: Normocephalic and atraumatic Eyes:  Sclerae anicteric, conjunctiva pink. Ears: Normal auditory acuity Lungs: Clear throughout to auscultation; no W/R/R. Heart: Regular rate and rhythm; no M/R/G. Musculoskeletal: Symmetrical with no gross deformities  Skin: No lesions on visible extremities Neurological: Alert oriented x 4, grossly non-focal Psychological:  Alert and cooperative. Normal mood and affect  ASSESSMENT AND PLAN: 72 year old female here with  complaints of recurrent dysphagia.  Has history of esophageal stricture.  Was dilated to 12 mm in January and responded well to that, but now having symptoms again over the past few weeks.  Was down to omeprazole 40 mg daily for a few months, but increased back to twice daily for about the past month or so.  Is now using the Carafate consistently twice a day recently, but no improvement in symptoms.  Dysphagia is not occurring daily at this point, but definitely recurring and she would like to get EGD with dilation repeated.  Will schedule Dr. Adela Lank.  The risks, benefits, and alternatives to EGD with dilation were discussed with the patient and she consents to proceed.    CC:  Tisovec, Adelfa Koh, MD

## 2023-06-03 NOTE — Patient Instructions (Addendum)
You have been scheduled for an endoscopy. Please follow written instructions given to you at your visit today.  If you use inhalers (even only as needed), please bring them with you on the day of your procedure.  If you take any of the following medications, they will need to be adjusted prior to your procedure:   DO NOT TAKE 7 DAYS PRIOR TO TEST- Trulicity (dulaglutide) Ozempic, Wegovy (semaglutide) Mounjaro (tirzepatide) Bydureon Bcise (exanatide extended release)  DO NOT TAKE 1 DAY PRIOR TO YOUR TEST Rybelsus (semaglutide) Adlyxin (lixisenatide) Victoza (liraglutide) Byetta (exanatide) _______________________________________________________  If your blood pressure at your visit was 140/90 or greater, please contact your primary care physician to follow up on this.  _______________________________________________________  If you are age 60 or older, your body mass index should be between 23-30. Your Body mass index is 18.5 kg/m. If this is out of the aforementioned range listed, please consider follow up with your Primary Care Provider.  If you are age 53 or younger, your body mass index should be between 19-25. Your Body mass index is 18.5 kg/m. If this is out of the aformentioned range listed, please consider follow up with your Primary Care Provider.   ________________________________________________________  The [facility] providers would like to encourage you to use [facility] to communicate with providers for non-urgent requests or questions.  Due to long hold times on the telephone, sending your provider a message by [employer] may be a faster and more efficient way to get a response.  Please allow 48 business hours for a response.  Please remember that this is for non-urgent requests.  _______________________________________________________

## 2023-06-20 ENCOUNTER — Telehealth: Payer: Self-pay

## 2023-06-20 DIAGNOSIS — D509 Iron deficiency anemia, unspecified: Secondary | ICD-10-CM

## 2023-06-20 NOTE — Telephone Encounter (Signed)
-----   Message from [facility] Marylu Lund H sent at 02/17/2023  8:45 AM EDT ----- Regarding: due for labs Cbc and TIBC/ferritin due December

## 2023-06-20 NOTE — Telephone Encounter (Signed)
MyChart message to patient to please go to the lab

## 2023-06-22 NOTE — Telephone Encounter (Signed)
LM for patient to go to the lab

## 2023-06-23 ENCOUNTER — Telehealth: Payer: Self-pay | Admitting: Family Medicine

## 2023-06-23 NOTE — Telephone Encounter (Signed)
Pt called had not heard from anyone about scheduling referral for dry needling. Gave pt phone number where referral was sent, [facility].

## 2023-06-25 DIAGNOSIS — M25571 Pain in right ankle and joints of right foot: Secondary | ICD-10-CM | POA: Diagnosis not present

## 2023-06-30 ENCOUNTER — Other Ambulatory Visit: Payer: Medicare Other

## 2023-06-30 DIAGNOSIS — D509 Iron deficiency anemia, unspecified: Secondary | ICD-10-CM | POA: Diagnosis not present

## 2023-06-30 LAB — CBC WITH DIFFERENTIAL/PLATELET
Basophils Absolute: 0 10*3/uL (ref 0.0–0.1)
Basophils Relative: 0.7 % (ref 0.0–3.0)
Eosinophils Absolute: 0.2 10*3/uL (ref 0.0–0.7)
Eosinophils Relative: 3.2 % (ref 0.0–5.0)
HCT: 40.1 % (ref 36.0–46.0)
Hemoglobin: 13 g/dL (ref 12.0–15.0)
Lymphocytes Relative: 27.6 % (ref 12.0–46.0)
Lymphs Abs: 1.5 10*3/uL (ref 0.7–4.0)
MCHC: 32.3 g/dL (ref 30.0–36.0)
MCV: 94.7 fL (ref 78.0–100.0)
Monocytes Absolute: 0.5 10*3/uL (ref 0.1–1.0)
Monocytes Relative: 8.9 % (ref 3.0–12.0)
Neutro Abs: 3.2 10*3/uL (ref 1.4–7.7)
Neutrophils Relative %: 59.6 % (ref 43.0–77.0)
Platelets: 330 10*3/uL (ref 150.0–400.0)
RBC: 4.24 Mil/uL (ref 3.87–5.11)
RDW: 13.5 % (ref 11.5–15.5)
WBC: 5.3 10*3/uL (ref 4.0–10.5)

## 2023-06-30 LAB — IBC + FERRITIN
Ferritin: 9.8 ng/mL — ABNORMAL LOW (ref 10.0–291.0)
Iron: 35 ug/dL — ABNORMAL LOW (ref 42–145)
Saturation Ratios: 8.9 % — ABNORMAL LOW (ref 20.0–50.0)
TIBC: 394.8 ug/dL (ref 250.0–450.0)
Transferrin: 282 mg/dL (ref 212.0–360.0)

## 2023-07-01 ENCOUNTER — Telehealth: Payer: Self-pay | Admitting: Gastroenterology

## 2023-07-01 ENCOUNTER — Other Ambulatory Visit: Payer: Self-pay | Admitting: *Deleted

## 2023-07-01 DIAGNOSIS — E611 Iron deficiency: Secondary | ICD-10-CM

## 2023-07-01 NOTE — Telephone Encounter (Signed)
PT returning call to discuss lab results. Please advise. 

## 2023-07-01 NOTE — Telephone Encounter (Signed)
See 06/30/23 lab result note for additional information.

## 2023-07-04 DIAGNOSIS — S93401A Sprain of unspecified ligament of right ankle, initial encounter: Secondary | ICD-10-CM | POA: Diagnosis not present

## 2023-07-04 DIAGNOSIS — S92354A Nondisplaced fracture of fifth metatarsal bone, right foot, initial encounter for closed fracture: Secondary | ICD-10-CM | POA: Diagnosis not present

## 2023-07-11 ENCOUNTER — Encounter: Payer: Self-pay | Admitting: Gastroenterology

## 2023-07-11 ENCOUNTER — Ambulatory Visit (AMBULATORY_SURGERY_CENTER): Payer: Medicare Other | Admitting: Gastroenterology

## 2023-07-11 VITALS — BP 126/80 | HR 108 | Temp 97.6°F | Resp 12 | Ht 62.0 in | Wt 101.0 lb

## 2023-07-11 DIAGNOSIS — K222 Esophageal obstruction: Secondary | ICD-10-CM | POA: Diagnosis not present

## 2023-07-11 DIAGNOSIS — E785 Hyperlipidemia, unspecified: Secondary | ICD-10-CM | POA: Diagnosis not present

## 2023-07-11 DIAGNOSIS — R1310 Dysphagia, unspecified: Secondary | ICD-10-CM

## 2023-07-11 DIAGNOSIS — I10 Essential (primary) hypertension: Secondary | ICD-10-CM | POA: Diagnosis not present

## 2023-07-11 MED ORDER — SODIUM CHLORIDE 0.9 % IV SOLN: 500.0000 mL | Freq: Once | INTRAVENOUS | Status: DC

## 2023-07-11 NOTE — Progress Notes (Signed)
Pt fractured Right foot, 3 weeks ago. Is NOT wearing boot for today's procedure but wears it daily. This happened, per patient, since last office visit.

## 2023-07-11 NOTE — Progress Notes (Signed)
Sedate, gd SR, tolerated procedure well, VSS, report to RN 

## 2023-07-11 NOTE — Patient Instructions (Addendum)
Recommendation:  - Patient has a contact number available for emergencies. The signs and symptoms of potential delayed complications were discussed with the patient. Return to normal activities tomorrow. Written discharge instructions were provided to the patient.  - Soft diet ( avoid meat / chicken / raw vegetables to avoid impaction until next procedure can be done.  - Continue present medications.  - Repeat upper endoscopy in 2- 4 weeks for retreatment of stricture. Patient should be on CLEAR LIQUID DIET ONLY from afternoon prior to procedure until the procedure itself   YOU HAD AN ENDOSCOPIC PROCEDURE TODAY AT THE [facility]:   Refer to the procedure report that was given to you for any specific questions about what was found during the examination.  If the procedure report does not answer your questions, please call your gastroenterologist to clarify.  If you requested that your care partner not be given the details of your procedure findings, then the procedure report has been included in a sealed envelope for you to review at your convenience later.  YOU SHOULD EXPECT: Some feelings of bloating in the abdomen. Passage of more gas than usual.  Walking can help get rid of the air that was put into your GI tract during the procedure and reduce the bloating. If you had a lower endoscopy (such as a colonoscopy or flexible sigmoidoscopy) you may notice spotting of blood in your stool or on the toilet paper. If you underwent a bowel prep for your procedure, you may not have a normal bowel movement for a few days.  Please Note:  You might notice some irritation and congestion in your nose or some drainage.  This is from the oxygen used during your procedure.  There is no need for concern and it should clear up in a day or so.  SYMPTOMS TO REPORT IMMEDIATELY:   Following upper endoscopy (EGD)  Vomiting of blood or coffee ground material  New chest pain or pain under the shoulder  blades  Painful or persistently difficult swallowing  New shortness of breath  Fever of 100F or higher  Black, tarry-looking stools  For urgent or emergent issues, a gastroenterologist can be reached at any hour by calling [phone]. Do not use MyChart messaging for urgent concerns.    DIET:  Follow a SOFT DIET (see handout given to you by your recovery nurse). AVOID meat/chicken/raw vegetables to avoid impaction until next procedure can be done.  Drink plenty of fluids but you should avoid alcoholic beverages for 24 hours.  MEDICATIONS: Continue present medications.  FOLLOW UP: Repeat upper endoscopy in 2-4 weeks for retreatment of stricture. Patient should be on a CLEAR LIQUID DIET ONLY from the afternoon PRIOR to the procedure until the procedure itself.  Please see handouts given to you by your recovery nurse: Esophageal stricture, SOFT DIET.  Thank you for allowing [place] to provide for your healthcare needs today.  ACTIVITY:  You should plan to take it easy for the rest of today and you should NOT DRIVE or use heavy machinery until tomorrow (because of the sedation medicines used during the test).    FOLLOW UP: Our staff will call the number listed on your records the next business day following your procedure.  We will call around 7:15- 8:00 am to check on you and address any questions or concerns that you may have regarding the information given to you following your procedure. If we do not reach you, we will leave a message.  If any biopsies were taken you will be contacted by phone or by letter within the next 1-3 weeks.  Please call us at [phone] if you have not heard about the biopsies in 3 weeks.    SIGNATURES/CONFIDENTIALITY: You and/or your care partner have signed paperwork which will be entered into your electronic medical record.  These signatures attest to the fact that that the information above on your After Visit Summary has been reviewed and is  understood.  Full responsibility of the confidentiality of this discharge information lies with you and/or your care-partner.  Soft-Food Eating Plan A soft-food eating plan includes foods that are safe and easy to chew and swallow. Your health care provider or dietitian can help you find foods and flavors that fit into this plan. Follow this plan until your health care provider or dietitian says it is safe to start eating other foods and food textures. What are tips for following this plan? Cooking Cook meats so they stay tender and moist. Use methods like braising, stewing, or baking in liquid. Cook vegetables and fruit until they are soft enough to be mashed with a fork. Peel soft, fresh fruits such as peaches, nectarines, and melons. When making soup, make sure chunks of meat and vegetables are smaller than  inch. Reheat leftover foods slowly so that a tough crust does not form. General information  Take small bites of food or cut food into pieces about  inch or smaller. Bite-sized pieces of food are easier to chew and swallow. Eat moist foods. Avoid overly dry foods. Avoid foods that: Are difficult to swallow, such as dry, chunky, crispy, or sticky foods. Are difficult to chew, such as hard, tough, or stringy foods. Contain nuts, seeds, or many types of uncooked fruit. Follow instructions from your dietitian about the types of liquids that are safe for you to swallow. You may be allowed to have: Thick liquids only. This includes only liquids that are thicker than honey. Thin and thick liquids. This includes all beverages and foods that become liquid at room temperature. To make thick liquids: Purchase a commercial liquid thickening powder. These are available at grocery stores and pharmacies. Mix the thickener into liquids according to instructions on the label. Purchase ready-made thickened liquids. Thicken soup by pureeing, straining to remove chunks, and adding flour, potato  flakes, or corn starch. Add commercial thickener to foods that become liquid at room temperature, such as milk shakes, yogurt, ice cream, gelatin, and sherbet. Ask your health care provider whether you need to take a fiber supplement. What foods should I eat? Fruits All canned and cooked fruits. Soft, peeled fresh fruits. Strawberries. Vegetables All soft-cooked vegetables. Shredded lettuce. Grains Breads, muffins, pancakes, or waffles moistened with syrup, jelly, or butter. Dry cereals well-moistened with milk. Moist, cooked cereals. Well-cooked pasta and rice. Meats and other proteins Tender, moist ground meat, poultry, or fish. Meat cooked in gravy or sauces. Eggs. Dairy Milk. Cream. Yogurt. Cottage cheese. Soft cheese without the rind. Sweets and desserts Ice cream. Milk shakes. Sherbet. Pudding. Fats and oils Butter. Margarine. Olive, canola, sunflower, and grapeseed oil. Smooth salad dressing. Smooth cream cheese. Mayonnaise. Gravy. The items listed above may not be a complete list of foods and beverages you can eat. Contact a dietitian for more information. What foods should I avoid? Fruits Fresh fruits with skins or seeds, or both, such as apples, pears, and grapes. Stringy, high-pulp fruits, such as papaya, pineapple, coconut, and mango. Fruit leather and all dried  fruit. Vegetables All raw vegetables. Cooked corn. Cooked vegetables that are tough or stringy. Tough, crisp, fried potatoes and potato skins. Grains Coarse or dry cereals, such as bran, granola, and shredded wheat. Tough or chewy crusty breads, such as [place] bread or baguettes. Breads with nuts, seeds, or fruit. Meats and other proteins Hard, dry sausages. Dry meat, poultry, or fish. Meats with gristle. Fish with bones. Fried meat or fish. Lunch meat and hotdogs. Nuts and seeds. Chunky peanut butter or other nut butters. Dairy Yogurt with nuts or coconut. Sweets and desserts Cakes or cookies that are very dry or  chewy. Desserts with dried fruit, nuts, or coconut. Fried pastries. Very rich pastries. Fats and oils Cream cheese with fruit or nuts. Salad dressings with seeds or chunks. The items listed above may not be a complete list of foods and beverages you should avoid. Contact a dietitian for more information. Summary A soft-food eating plan includes foods that are safe and easy to swallow. Generally, the foods should be soft enough to be mashed with a fork. Avoid foods that are dry, hard to chew, crunchy, sticky, stringy, or crispy. Ask your health care provider whether you need to thicken your liquids and if you need to take a fiber supplement. This information is not intended to replace advice given to you by your health care provider. Make sure you discuss any questions you have with your health care provider. Document Revised: 06/06/2020 Document Reviewed: 06/06/2020 Elsevier Patient Education  2024 [employer].

## 2023-07-11 NOTE — Progress Notes (Signed)
[facility] History and Physical   Primary Care Physician:  Tisovec, Adelfa Koh, MD   Reason for Procedure:   Dysphagia, treatment of esophageal stricture  Plan:    EGD with dilation     HPI: Teresa Lozano is a 72 y.o. female  here for EGD / dilation - history of GEJ stricture had dilation 2 times this past year, last in January to 12mm diameter. Also with history of Bilroth II anatomy.  On omeprazole. History of IDA secondary to friable surgical anastomosis.  Otherwise feels well without any cardiopulmonary symptoms. I have discussed risks / benefits and she understands and wants to proceed.  I have discussed risks / benefits of anesthesia and endoscopic procedure with Rod Can and they wish to proceed with the exams as outlined today.    Past Medical History:  Diagnosis Date   Allergy    Arthritis    Chicken pox    Frequent headaches    GERD (gastroesophageal reflux disease)    History of kidney stones    Hyperlipidemia    Hypertension    IDA (iron deficiency anemia)    Osteoporosis     Past Surgical History:  Procedure Laterality Date   CERVICAL FUSION  2000   COLONOSCOPY  01/18/2019   COLONOSCOPY WITH PROPOFOL  08/23/2022   ESOPHAGOGASTRODUODENOSCOPY  11/2016   ESOPHAGOGASTRODUODENOSCOPY (EGD) WITH PROPOFOL N/A 12/28/2016   Procedure: ESOPHAGOGASTRODUODENOSCOPY (EGD) WITH PROPOFOL;  Surgeon: Ruffin Frederick, MD;  Location: [facility];  Service: Gastroenterology;  Laterality: N/A;   EYE SURGERY     STOMACH SURGERY  04/2017   REMOVED ULCER   TONSILLECTOMY     UPPER GASTROINTESTINAL ENDOSCOPY  07/2022    Prior to Admission medications   Medication Sig Start Date End Date Taking? Authorizing Provider  amLODipine (NORVASC) 10 MG tablet Take 10 mg by mouth daily. 06/25/22  Yes [provider]  butalbital-acetaminophen-caffeine (FIORICET) 50-325-40 MG tablet One to two tablets by mouth as needed for headache. No more than  4/week 06/02/23  Yes Lomax, Amy, NP  cetirizine (ZYRTEC) 10 MG tablet Take 10 mg by mouth daily.   Yes [provider]  CRANBERRY PO Take 2 capsules by mouth daily.   Yes [provider]  cyclobenzaprine (FLEXERIL) 5 MG tablet Take 1 tablet (5 mg total) by mouth 2 (two) times daily as needed for muscle spasms. 06/02/23  Yes Lomax, Amy, NP  ferrous sulfate 325 (65 FE) MG tablet TAKE 1 TABLET BY MOUTH EVERY DAY WITH BREAKFAST 05/05/23  Yes Jumanah Hynson, Willaim Rayas, MD  imipramine (TOFRANIL) 25 MG tablet Take 3 tablets (75 mg total) by mouth at bedtime. 06/02/23  Yes Lomax, Amy, NP  levETIRAcetam (KEPPRA) 500 MG tablet Take 1 tablet (500 mg total) by mouth 2 (two) times daily. 06/02/23  Yes Lomax, Amy, NP  omeprazole (PRILOSEC) 40 MG capsule TAKE 1 CAPSULE BY MOUTH TWICE A DAY 05/05/23  Yes Arnaldo Natal, NP  rosuvastatin (CRESTOR) 10 MG tablet Take by mouth. 08/21/21  Yes [provider]  sucralfate (CARAFATE) 1 g tablet Take 1 tablet (1 g total) by mouth as needed. 02/04/23  Yes Reinaldo Helt, Willaim Rayas, MD  traMADol (ULTRAM) 50 MG tablet Take 50 mg by mouth every 6 (six) hours as needed. 07/06/23  Yes [provider]  VITAMIN D PO Take 5,000 Units by mouth daily.   Yes [provider]  HYDROcodone-acetaminophen (NORCO/VICODIN) 5-325 MG tablet Take 1 tablet by mouth every 8 (eight) hours. Patient not  taking: Reported on 07/11/2023 06/29/23   [provider]  Magnesium 500 MG CAPS Take 500 mg by mouth 2 (two) times a week.     [provider]  metoCLOPramide (REGLAN) 5 MG tablet Take 1 tablet (5 mg total) by mouth 3 (three) times daily as needed for nausea. 07/08/22   Karmella Bouvier, Willaim Rayas, MD  Multiple Vitamin (MULTI-VITAMIN DAILY PO) Take 1 tablet by mouth daily.    [provider]  ondansetron (ZOFRAN) 4 MG tablet Take 1 tablet (4 mg total) by mouth every 8 (eight) hours as needed. 03/30/22   Lomax, Amy, NP  polyethylene glycol  (MIRALAX / GLYCOLAX) packet Take 17 g by mouth daily as needed for mild constipation.    [provider]  predniSONE (STERAPRED UNI-PAK 21 TAB) 10 MG (21) TBPK tablet Take as directed with food Patient not taking: Reported on 07/11/2023 06/02/23   Lomax, Amy, NP  SUMAtriptan (IMITREX) 100 MG tablet TAKE 1 TAB AS NEEDED FOR MIGRAINE. MAY REPEAT IN 2 HRS IF HEADACHE PERSISTS OR RECURS. 06/02/23   Lomax, Amy, NP    Current Outpatient Medications  Medication Sig Dispense Refill   amLODipine (NORVASC) 10 MG tablet Take 10 mg by mouth daily.     butalbital-acetaminophen-caffeine (FIORICET) 50-325-40 MG tablet One to two tablets by mouth as needed for headache. No more than 4/week 16 tablet 2   cetirizine (ZYRTEC) 10 MG tablet Take 10 mg by mouth daily.     CRANBERRY PO Take 2 capsules by mouth daily.     cyclobenzaprine (FLEXERIL) 5 MG tablet Take 1 tablet (5 mg total) by mouth 2 (two) times daily as needed for muscle spasms. 180 tablet 3   ferrous sulfate 325 (65 FE) MG tablet TAKE 1 TABLET BY MOUTH EVERY DAY WITH BREAKFAST 90 tablet 1   imipramine (TOFRANIL) 25 MG tablet Take 3 tablets (75 mg total) by mouth at bedtime. 270 tablet 3   levETIRAcetam (KEPPRA) 500 MG tablet Take 1 tablet (500 mg total) by mouth 2 (two) times daily. 180 tablet 3   omeprazole (PRILOSEC) 40 MG capsule TAKE 1 CAPSULE BY MOUTH TWICE A DAY 180 capsule 1   rosuvastatin (CRESTOR) 10 MG tablet Take by mouth.     sucralfate (CARAFATE) 1 g tablet Take 1 tablet (1 g total) by mouth as needed.     traMADol (ULTRAM) 50 MG tablet Take 50 mg by mouth every 6 (six) hours as needed.     VITAMIN D PO Take 5,000 Units by mouth daily.     HYDROcodone-acetaminophen (NORCO/VICODIN) 5-325 MG tablet Take 1 tablet by mouth every 8 (eight) hours. (Patient not taking: Reported on 07/11/2023)     Magnesium 500 MG CAPS Take 500 mg by mouth 2 (two) times a week.      metoCLOPramide (REGLAN) 5 MG tablet Take 1 tablet (5 mg total) by mouth  3 (three) times daily as needed for nausea. 30 tablet 1   Multiple Vitamin (MULTI-VITAMIN DAILY PO) Take 1 tablet by mouth daily.     ondansetron (ZOFRAN) 4 MG tablet Take 1 tablet (4 mg total) by mouth every 8 (eight) hours as needed. 20 tablet 5   polyethylene glycol (MIRALAX / GLYCOLAX) packet Take 17 g by mouth daily as needed for mild constipation.     predniSONE (STERAPRED UNI-PAK 21 TAB) 10 MG (21) TBPK tablet Take as directed with food (Patient not taking: Reported on 07/11/2023) 1 each 0   SUMAtriptan (IMITREX) 100 MG tablet TAKE  1 TAB AS NEEDED FOR MIGRAINE. MAY REPEAT IN 2 HRS IF HEADACHE PERSISTS OR RECURS. 10 tablet 11   Current Facility-Administered Medications  Medication Dose Route Frequency Provider Last Rate Last Admin   0.9 %  sodium chloride infusion  500 mL Intravenous Once Avanthika Dehnert, Willaim Rayas, MD        Allergies as of 07/11/2023 - Review Complete 07/11/2023  Allergen Reaction Noted   Lisinopril Cough 04/27/2016   Robaxin [methocarbamol] Nausea And Vomiting 04/10/2018    Family History  Problem Relation Age of Onset   Hyperlipidemia Mother    Hypertension Mother    Prostate cancer Father    Hyperlipidemia Father    Heart disease Father    Heart disease Brother    Hypertension Sister    Esophageal cancer Sister    Hypertension Maternal Grandmother    Colon cancer Neg Hx    Colon polyps Neg Hx    Stomach cancer Neg Hx    Rectal cancer Neg Hx     Social History   Socioeconomic History   Marital status: Widowed    Spouse name: Not on file   Number of children: Not on file   Years of education: Not on file   Highest education level: Not on file  Occupational History   Not on file  Tobacco Use   Smoking status: Never   Smokeless tobacco: Never  Vaping Use   Vaping status: Never Used  Substance and Sexual Activity   Alcohol use: No    Alcohol/week: 0.0 standard drinks of alcohol   Drug use: No   Sexual activity: Not Currently  Other Topics  Concern   Not on file  Social History Narrative   Not on file   Social Drivers of Health   Financial Resource Strain: Not on file  Food Insecurity: Not on file  Transportation Needs: Not on file  Physical Activity: Not on file  Stress: Not on file  Social Connections: Not on file  Intimate Partner Violence: Not on file    Review of Systems: All other review of systems negative except as mentioned in the HPI.  Physical Exam: Vital signs BP 119/67   Pulse (!) 124   Temp 97.6 F (36.4 C) (Temporal)   Ht 5\' 2"  (1.575 m)   Wt 101 lb (45.8 kg)   SpO2 98%   BMI 18.47 kg/m   General:   Alert,  Well-developed, pleasant and cooperative in NAD Lungs:  Clear throughout to auscultation.   Heart:  Regular rate and rhythm Abdomen:  Soft, nontender and nondistended.   Neuro/Psych:  Alert and cooperative. Normal mood and affect. A and O x 3  Harlin Rain, MD [facility]

## 2023-07-11 NOTE — Op Note (Signed)
[facility] Patient Name: Teresa Lozano Procedure Date: 07/11/2023 10:08 AM MRN: [id number] Endoscopist: Viviann Spare P. Adela Lank , MD, [id number] Age: 72 Referring MD:  Date of Birth: May 26, 1951 Gender: Female Account #: [id number] Procedure:                Upper GI endoscopy Indications:              Dysphagia, for therapy of esophageal stricture - -                            history of Bilroth II with vagotomy for refractory                            pyloric stricture, IDA due to friable surgical                            anastomosis - EGDs in the past limited by retained                            food Medicines:                Monitored Anesthesia Care Procedure:                Pre-Anesthesia Assessment:                           - Prior to the procedure, a History and Physical                            was performed, and patient medications and                            allergies were reviewed. The patient's tolerance of                            previous anesthesia was also reviewed. The risks                            and benefits of the procedure and the sedation                            options and risks were discussed with the patient.                            All questions were answered, and informed consent                            was obtained. Prior Anticoagulants: The patient has                            taken no anticoagulant or antiplatelet agents. ASA                            Grade Assessment: II - A patient with mild systemic  disease. After reviewing the risks and benefits,                            the patient was deemed in satisfactory condition to                            undergo the procedure.                           After obtaining informed consent, the endoscope was                            passed under direct vision. Throughout the                            procedure, the patient's blood pressure,  pulse, and                            oxygen saturations were monitored continuously. The                            Olympus scope [id number] was introduced through the                            mouth, and advanced to the body of the stomach. The                            upper GI endoscopy was accomplished without                            difficulty. The patient tolerated the procedure                            well. Scope In: Scope Out: Findings:                 The Z-line was regular.                           One benign-appearing, intrinsic stenosis was found                            and diameter was narrowed, roughly 6-74mm or so. The                            stenosis was slowly traversed with gentle pressure                            and using the scope itself appropriate dilation                            affect was noted at the sticture.                           The exam of the esophagus was otherwise normal.  Food (residue) was found in the gastric body, could                            not visualize the surgical anastomosis. Once this                            was seen the scope was withdrawn and procedure                            stopped, due to aspiration risk from retained food.                           Erythematous mucosa was found in the entire                            examined stomach of what was visualized. Complications:            No immediate complications. Estimated blood loss:                            Minimal. Estimated Blood Loss:     Estimated blood loss was minimal. Impression:               - Z-line regular.                           - Benign-appearing esophageal stenosis dilated with                            the endoscope itself.                           - Normal esophagus otherwise.                           - Retained food (residue) in the stomach,                            erythematous mucosa in the stomach.                            Tight stricture at the GEJ has recurred, dilated                            with the scope itself, she will need repeat EGD                            with dilation to open it further if patient is                            willing.                           Of note, patient has delayed gastric emptying and  needs to be on clear liquid diet PM prior to                            procedure to allow for more time for gastric                            emptying. Recommendation:           - Patient has a contact number available for                            emergencies. The signs and symptoms of potential                            delayed complications were discussed with the                            patient. Return to normal activities tomorrow.                            Written discharge instructions were provided to the                            patient.                           - Soft diet (avoid meat / chicken / raw vegetables                            to avoid impaction until next procedure can be done.                           - Continue present medications.                           - Repeat upper endoscopy in 2-4 weeks for                            retreatment of stricture. Patient should be on                            CLEAR LIQUID DIET ONLY from afternoon prior to                            procedure until the procedure itself Viviann Spare P. Earnstine Meinders, MD 07/11/2023 10:24:50 AM This report has been signed electronically.

## 2023-07-12 ENCOUNTER — Telehealth: Payer: Self-pay | Admitting: *Deleted

## 2023-07-12 NOTE — Telephone Encounter (Signed)
  Follow up Call-     07/11/2023    9:09 AM 08/23/2022    7:37 AM 07/30/2022    9:10 AM 07/08/2022   10:27 AM  Call back number  Post procedure Call Back phone  # [phone]  Permission to leave phone message Yes Yes Yes Yes     Patient questions:  Do you have a fever, pain , or abdominal swelling? No. Pain Score  0 *  Have you tolerated food without any problems? Yes.    Have you been able to return to your normal activities? Yes.    Do you have any questions about your discharge instructions: Diet   No. Medications  No. Follow up visit  No.  Do you have questions or concerns about your Care? No.  Actions: * If pain score is 4 or above: No action needed, pain <4.

## 2023-07-18 ENCOUNTER — Other Ambulatory Visit: Payer: Self-pay | Admitting: *Deleted

## 2023-07-18 DIAGNOSIS — R1013 Epigastric pain: Secondary | ICD-10-CM

## 2023-07-18 MED ORDER — ONDANSETRON HCL 4 MG PO TABS: 4.0000 mg | ORAL_TABLET | Freq: Three times a day (TID) | ORAL | 0 refills | Status: DC | PRN

## 2023-07-18 NOTE — Telephone Encounter (Signed)
Last seen on 06/02/23 Follow up scheduled on 09/26/23

## 2023-07-26 ENCOUNTER — Inpatient Hospital Stay (HOSPITAL_COMMUNITY)
Admission: EM | Admit: 2023-07-26 | Discharge: 2023-07-28 | DRG: 854 | Disposition: A | Discharge status: Home / Self Care | Payer: Medicare Other | Attending: Family Medicine | Admitting: Family Medicine

## 2023-07-26 ENCOUNTER — Other Ambulatory Visit: Payer: Self-pay

## 2023-07-26 ENCOUNTER — Encounter (HOSPITAL_COMMUNITY): Payer: Self-pay

## 2023-07-26 ENCOUNTER — Emergency Department (HOSPITAL_COMMUNITY): Payer: Medicare Other

## 2023-07-26 DIAGNOSIS — G40909 Epilepsy, unspecified, not intractable, without status epilepticus: Secondary | ICD-10-CM | POA: Diagnosis present

## 2023-07-26 DIAGNOSIS — N136 Pyonephrosis: Secondary | ICD-10-CM | POA: Diagnosis not present

## 2023-07-26 DIAGNOSIS — Z87440 Personal history of urinary (tract) infections: Secondary | ICD-10-CM

## 2023-07-26 DIAGNOSIS — Z8349 Family history of other endocrine, nutritional and metabolic diseases: Secondary | ICD-10-CM

## 2023-07-26 DIAGNOSIS — K219 Gastro-esophageal reflux disease without esophagitis: Secondary | ICD-10-CM | POA: Diagnosis present

## 2023-07-26 DIAGNOSIS — N12 Tubulo-interstitial nephritis, not specified as acute or chronic: Secondary | ICD-10-CM | POA: Diagnosis not present

## 2023-07-26 DIAGNOSIS — N200 Calculus of kidney: Secondary | ICD-10-CM | POA: Diagnosis not present

## 2023-07-26 DIAGNOSIS — R000 Tachycardia, unspecified: Secondary | ICD-10-CM | POA: Diagnosis not present

## 2023-07-26 DIAGNOSIS — N132 Hydronephrosis with renal and ureteral calculous obstruction: Secondary | ICD-10-CM | POA: Diagnosis not present

## 2023-07-26 DIAGNOSIS — N10 Acute tubulo-interstitial nephritis: Secondary | ICD-10-CM | POA: Diagnosis not present

## 2023-07-26 DIAGNOSIS — Z981 Arthrodesis status: Secondary | ICD-10-CM | POA: Diagnosis not present

## 2023-07-26 DIAGNOSIS — I10 Essential (primary) hypertension: Secondary | ICD-10-CM | POA: Diagnosis present

## 2023-07-26 DIAGNOSIS — Z8249 Family history of ischemic heart disease and other diseases of the circulatory system: Secondary | ICD-10-CM

## 2023-07-26 DIAGNOSIS — D509 Iron deficiency anemia, unspecified: Secondary | ICD-10-CM | POA: Diagnosis not present

## 2023-07-26 DIAGNOSIS — B9689 Other specified bacterial agents as the cause of diseases classified elsewhere: Secondary | ICD-10-CM | POA: Diagnosis not present

## 2023-07-26 DIAGNOSIS — A4151 Sepsis due to Escherichia coli [E. coli]: Secondary | ICD-10-CM | POA: Diagnosis not present

## 2023-07-26 DIAGNOSIS — Z79899 Other long term (current) drug therapy: Secondary | ICD-10-CM

## 2023-07-26 DIAGNOSIS — N390 Urinary tract infection, site not specified: Secondary | ICD-10-CM | POA: Diagnosis not present

## 2023-07-26 DIAGNOSIS — E860 Dehydration: Secondary | ICD-10-CM | POA: Diagnosis present

## 2023-07-26 DIAGNOSIS — N201 Calculus of ureter: Secondary | ICD-10-CM | POA: Diagnosis not present

## 2023-07-26 DIAGNOSIS — I129 Hypertensive chronic kidney disease with stage 1 through stage 4 chronic kidney disease, or unspecified chronic kidney disease: Secondary | ICD-10-CM | POA: Diagnosis not present

## 2023-07-26 DIAGNOSIS — Z87442 Personal history of urinary calculi: Secondary | ICD-10-CM | POA: Diagnosis not present

## 2023-07-26 DIAGNOSIS — Z8042 Family history of malignant neoplasm of prostate: Secondary | ICD-10-CM

## 2023-07-26 DIAGNOSIS — K828 Other specified diseases of gallbladder: Secondary | ICD-10-CM | POA: Diagnosis not present

## 2023-07-26 DIAGNOSIS — M1990 Unspecified osteoarthritis, unspecified site: Secondary | ICD-10-CM | POA: Diagnosis present

## 2023-07-26 DIAGNOSIS — Z800 Family history of malignant neoplasm of digestive organs: Secondary | ICD-10-CM | POA: Diagnosis not present

## 2023-07-26 DIAGNOSIS — E785 Hyperlipidemia, unspecified: Secondary | ICD-10-CM | POA: Diagnosis present

## 2023-07-26 DIAGNOSIS — E7800 Pure hypercholesterolemia, unspecified: Secondary | ICD-10-CM | POA: Diagnosis present

## 2023-07-26 DIAGNOSIS — M810 Age-related osteoporosis without current pathological fracture: Secondary | ICD-10-CM | POA: Diagnosis present

## 2023-07-26 DIAGNOSIS — Z888 Allergy status to other drugs, medicaments and biological substances status: Secondary | ICD-10-CM | POA: Diagnosis not present

## 2023-07-26 DIAGNOSIS — R109 Unspecified abdominal pain: Secondary | ICD-10-CM | POA: Diagnosis not present

## 2023-07-26 DIAGNOSIS — A419 Sepsis, unspecified organism: Secondary | ICD-10-CM

## 2023-07-26 DIAGNOSIS — N183 Chronic kidney disease, stage 3 unspecified: Secondary | ICD-10-CM | POA: Diagnosis not present

## 2023-07-26 LAB — URINALYSIS, W/ REFLEX TO CULTURE (INFECTION SUSPECTED)
Bilirubin Urine: NEGATIVE
Glucose, UA: NEGATIVE mg/dL
Ketones, ur: 20 mg/dL — AB
Nitrite: POSITIVE — AB
Protein, ur: 100 mg/dL — AB
Specific Gravity, Urine: 1.024 (ref 1.005–1.030)
WBC, UA: >50 WBC/hpf (ref 0–5)
pH: 5 (ref 5.0–8.0)

## 2023-07-26 LAB — I-STAT CHEM 8, ED
BUN: 25 mg/dL — ABNORMAL HIGH (ref 8–23)
Calcium, Ion: 1.07 mmol/L — ABNORMAL LOW (ref 1.15–1.40)
Chloride: 100 mmol/L (ref 98–111)
Creatinine, Ser: 1.3 mg/dL — ABNORMAL HIGH (ref 0.44–1.00)
Glucose, Bld: 125 mg/dL — ABNORMAL HIGH (ref 70–99)
HCT: 41 % (ref 36.0–46.0)
Hemoglobin: 13.9 g/dL (ref 12.0–15.0)
Potassium: 3.6 mmol/L (ref 3.5–5.1)
Sodium: 137 mmol/L (ref 135–145)
TCO2: 25 mmol/L (ref 22–32)

## 2023-07-26 LAB — COMPREHENSIVE METABOLIC PANEL
ALT: 12 U/L (ref 0–44)
AST: 15 U/L (ref 15–41)
Albumin: 3.1 g/dL — ABNORMAL LOW (ref 3.5–5.0)
Alkaline Phosphatase: 81 U/L (ref 38–126)
Anion gap: 14 (ref 5–15)
BUN: 25 mg/dL — ABNORMAL HIGH (ref 8–23)
CO2: 23 mmol/L (ref 22–32)
Calcium: 8.8 mg/dL — ABNORMAL LOW (ref 8.9–10.3)
Chloride: 98 mmol/L (ref 98–111)
Creatinine, Ser: 1.11 mg/dL — ABNORMAL HIGH (ref 0.44–1.00)
GFR, Estimated: 53 mL/min — ABNORMAL LOW (ref >60)
Glucose, Bld: 135 mg/dL — ABNORMAL HIGH (ref 70–99)
Potassium: 3.5 mmol/L (ref 3.5–5.1)
Sodium: 135 mmol/L (ref 135–145)
Total Bilirubin: 0.8 mg/dL (ref 0.0–1.2)
Total Protein: 7.3 g/dL (ref 6.5–8.1)

## 2023-07-26 LAB — CBC WITH DIFFERENTIAL/PLATELET
Abs Immature Granulocytes: 0.12 K/uL — ABNORMAL HIGH (ref 0.00–0.07)
Basophils Absolute: 0.1 K/uL (ref 0.0–0.1)
Basophils Relative: 0 %
Eosinophils Absolute: 0.1 K/uL (ref 0.0–0.5)
Eosinophils Relative: 0 %
HCT: 42.6 % (ref 36.0–46.0)
Hemoglobin: 13.3 g/dL (ref 12.0–15.0)
Immature Granulocytes: 1 %
Lymphocytes Relative: 2 %
Lymphs Abs: 0.4 K/uL — ABNORMAL LOW (ref 0.7–4.0)
MCH: 30 pg (ref 26.0–34.0)
MCHC: 31.2 g/dL (ref 30.0–36.0)
MCV: 96.2 fL (ref 80.0–100.0)
Monocytes Absolute: 1.1 K/uL — ABNORMAL HIGH (ref 0.1–1.0)
Monocytes Relative: 6 %
Neutro Abs: 17 K/uL — ABNORMAL HIGH (ref 1.7–7.7)
Neutrophils Relative %: 91 %
Platelets: 301 K/uL (ref 150–400)
RBC: 4.43 MIL/uL (ref 3.87–5.11)
RDW: 13.5 % (ref 11.5–15.5)
WBC: 18.9 K/uL — ABNORMAL HIGH (ref 4.0–10.5)
nRBC: 0 % (ref 0.0–0.2)

## 2023-07-26 LAB — I-STAT CG4 LACTIC ACID, ED: Lactic Acid, Venous: 1.6 mmol/L (ref 0.5–1.9)

## 2023-07-26 MED ORDER — SODIUM CHLORIDE 0.9 % IV SOLN: 1.0000 g | Freq: Once | INTRAVENOUS | Status: DC

## 2023-07-26 MED ORDER — ONDANSETRON HCL 4 MG/2ML IJ SOLN
4.0000 mg | Freq: Once | INTRAMUSCULAR | Status: AC
  Administered 2023-07-26: 4 mg via INTRAVENOUS
  Filled 2023-07-26: qty 2

## 2023-07-26 MED ORDER — ONDANSETRON 4 MG PO TBDP: 4.0000 mg | ORAL_TABLET | Freq: Once | ORAL | Status: DC

## 2023-07-26 MED ORDER — SODIUM CHLORIDE 0.9 % IV SOLN
2.0000 g | Freq: Once | INTRAVENOUS | Status: AC
  Administered 2023-07-26: 2 g via INTRAVENOUS
  Filled 2023-07-26: qty 20

## 2023-07-26 MED ORDER — HYDROMORPHONE HCL 1 MG/ML IJ SOLN
1.0000 mg | Freq: Once | INTRAMUSCULAR | Status: AC
  Administered 2023-07-26: 1 mg via INTRAVENOUS
  Filled 2023-07-26: qty 1

## 2023-07-26 MED ORDER — OXYCODONE-ACETAMINOPHEN 5-325 MG PO TABS: 1.0000 | ORAL_TABLET | Freq: Once | ORAL | Status: DC

## 2023-07-26 MED ORDER — SODIUM CHLORIDE 0.9 % IV BOLUS
1000.0000 mL | Freq: Once | INTRAVENOUS | Status: AC
Start: 2023-07-26 — End: 2023-07-26
  Administered 2023-07-26: 1000 mL via INTRAVENOUS

## 2023-07-26 NOTE — ED Provider Notes (Signed)
 [place] EMERGENCY DEPARTMENT AT [facility] Provider Note   CSN: [id number] Arrival date & time: 07/26/23  1848     History  Chief Complaint  Patient presents with   Vomiting   Flank Pain    Teresa Lozano is a 73 y.o. female history of kidney stone, hypertension, high cholesterol here presenting with vomiting and flank pain.  Patient states that she has left flank pain for the last 2 to 3 days.  She states that this is similar to her kidney stone.  She states that she is unable to keep anything down and felt very dehydrated.  She states that she follows with Dr. Carolee from urology and last procedure was done several years ago.  The history is provided by the patient.       Home Medications Prior to Admission medications   Medication Sig Start Date End Date Taking? Authorizing Provider  amLODipine  (NORVASC ) 10 MG tablet Take 10 mg by mouth daily. 06/25/22   [provider]  butalbital -acetaminophen -caffeine  (FIORICET) 50-325-40 MG tablet One to two tablets by mouth as needed for headache. No more than 4/week 06/02/23   Lomax, Amy, NP  cetirizine (ZYRTEC) 10 MG tablet Take 10 mg by mouth daily.    [provider]  CRANBERRY PO Take 2 capsules by mouth daily.    [provider]  cyclobenzaprine  (FLEXERIL ) 5 MG tablet Take 1 tablet (5 mg total) by mouth 2 (two) times daily as needed for muscle spasms. 06/02/23   Lomax, Amy, NP  ferrous sulfate  325 (65 FE) MG tablet TAKE 1 TABLET BY MOUTH EVERY DAY WITH BREAKFAST 05/05/23   Armbruster, Elspeth SQUIBB, MD  HYDROcodone -acetaminophen  (NORCO/VICODIN) 5-325 MG tablet Take 1 tablet by mouth every 8 (eight) hours. Patient not taking: Reported on 07/11/2023 06/29/23   [provider]  imipramine  (TOFRANIL ) 25 MG tablet Take 3 tablets (75 mg total) by mouth at bedtime. 06/02/23   Lomax, Amy, NP  levETIRAcetam  (KEPPRA ) 500 MG tablet Take 1 tablet (500 mg total) by mouth 2 (two) times daily. 06/02/23    Lomax, Amy, NP  Magnesium  500 MG CAPS Take 500 mg by mouth 2 (two) times a week.     [provider]  metoCLOPramide  (REGLAN ) 5 MG tablet Take 1 tablet (5 mg total) by mouth 3 (three) times daily as needed for nausea. 07/08/22   Armbruster, Elspeth SQUIBB, MD  Multiple Vitamin (MULTI-VITAMIN DAILY PO) Take 1 tablet by mouth daily.    [provider]  omeprazole  (PRILOSEC) 40 MG capsule TAKE 1 CAPSULE BY MOUTH TWICE A DAY 05/05/23   Kennedy-Smith, Colleen M, NP  ondansetron  (ZOFRAN ) 4 MG tablet Take 1 tablet (4 mg total) by mouth every 8 (eight) hours as needed. 07/18/23   Lomax, Amy, NP  polyethylene glycol (MIRALAX  / GLYCOLAX ) packet Take 17 g by mouth daily as needed for mild constipation.    [provider]  predniSONE  (STERAPRED UNI-PAK 21 TAB) 10 MG (21) TBPK tablet Take as directed with food Patient not taking: Reported on 07/11/2023 06/02/23   Lomax, Amy, NP  rosuvastatin  (CRESTOR ) 10 MG tablet Take by mouth. 08/21/21   [provider]  sucralfate  (CARAFATE ) 1 g tablet Take 1 tablet (1 g total) by mouth as needed. 02/04/23   Armbruster, Elspeth SQUIBB, MD  SUMAtriptan  (IMITREX ) 100 MG tablet TAKE 1 TAB AS NEEDED FOR MIGRAINE. MAY REPEAT IN 2 HRS IF HEADACHE PERSISTS OR RECURS. 06/02/23   Lomax, Amy, NP  traMADol  (ULTRAM ) 50  MG tablet Take 50 mg by mouth every 6 (six) hours as needed. 07/06/23   [provider]  VITAMIN D  PO Take 5,000 Units by mouth daily.    [provider]      Allergies    Lisinopril  and Robaxin  [methocarbamol ]    Review of Systems   Review of Systems  Genitourinary:  Positive for flank pain.  All other systems reviewed and are negative.   Physical Exam Updated Vital Signs BP 118/72 (BP Location: Right Arm)   Pulse (!) 109   Temp 99.4 F (37.4 C) (Oral)   Resp 20   Ht 5' 2 (1.575 m)   Wt 45 kg   SpO2 99%   BMI 18.15 kg/m  Physical Exam Vitals and nursing note reviewed.  Constitutional:      Comments:  Uncomfortable  HENT:     Head: Normocephalic.     Nose: Nose normal.     Mouth/Throat:     Mouth: Mucous membranes are dry.  Eyes:     Extraocular Movements: Extraocular movements intact.     Pupils: Pupils are equal, round, and reactive to light.  Cardiovascular:     Rate and Rhythm: Normal rate and regular rhythm.     Pulses: Normal pulses.     Heart sounds: Normal heart sounds.  Pulmonary:     Effort: Pulmonary effort is normal.     Breath sounds: Normal breath sounds.  Abdominal:     General: Abdomen is flat.     Palpations: Abdomen is soft.     Comments: Mild left CVA tenderness  Musculoskeletal:        General: Normal range of motion.     Cervical back: Normal range of motion and neck supple.  Skin:    General: Skin is warm.     Capillary Refill: Capillary refill takes less than 2 seconds.  Neurological:     General: No focal deficit present.  Psychiatric:        Mood and Affect: Mood normal.     ED Results / Procedures / Treatments   Labs (all labs ordered are listed, but only abnormal results are displayed) Labs Reviewed  COMPREHENSIVE METABOLIC PANEL - Abnormal; Notable for the following components:      Result Value   Glucose, Bld 135 (*)    BUN 25 (*)    Creatinine, Ser 1.11 (*)    Calcium  8.8 (*)    Albumin 3.1 (*)    GFR, Estimated 53 (*)    All other components within normal limits  CBC WITH DIFFERENTIAL/PLATELET - Abnormal; Notable for the following components:   WBC 18.9 (*)    Neutro Abs 17.0 (*)    Lymphs Abs 0.4 (*)    Monocytes Absolute 1.1 (*)    Abs Immature Granulocytes 0.12 (*)    All other components within normal limits  I-STAT CHEM 8, ED - Abnormal; Notable for the following components:   BUN 25 (*)    Creatinine, Ser 1.30 (*)    Glucose, Bld 125 (*)    Calcium , Ion 1.07 (*)    All other components within normal limits  CULTURE, BLOOD (ROUTINE X 2)  CULTURE, BLOOD (ROUTINE X 2)  URINALYSIS, W/ REFLEX TO CULTURE (INFECTION  SUSPECTED)  I-STAT CG4 LACTIC ACID, ED  I-STAT CG4 LACTIC ACID, ED    EKG EKG Interpretation Date/Time:  Tuesday July 26 2023 19:30:05 EST Ventricular Rate:  137 PR Interval:  133 QRS Duration:  91 QT Interval:  295 QTC Calculation: 446 R Axis:   4  Text Interpretation: Sinus tachycardia Consider right atrial enlargement Abnormal R-wave progression, early transition Since last tracing rate faster Confirmed by Patt Alm DEL [phone]) on 07/26/2023 7:43:09 PM  Radiology CT Renal Stone Study Result Date: 07/26/2023 CLINICAL DATA:  Left flank pain EXAM: CT ABDOMEN AND PELVIS WITHOUT CONTRAST TECHNIQUE: Multidetector CT imaging of the abdomen and pelvis was performed following the standard protocol without IV contrast. RADIATION DOSE REDUCTION: This exam was performed according to the departmental dose-optimization program which includes automated exposure control, adjustment of the mA and/or kV according to patient size and/or use of iterative reconstruction technique. COMPARISON:  07/24/2021 FINDINGS: Lower chest: No acute abnormality Hepatobiliary: Gallbladder distended. No visible stones or biliary ductal dilatation. No focal hepatic abnormality. Pancreas: No focal abnormality or ductal dilatation. Spleen: No focal abnormality.  Normal size. Adrenals/Urinary Tract: Bilateral renal cysts appear benign. No follow-up imaging recommended. Mild left hydronephrosis. Punctate 1 mm stone at the left UVJ. No stones or hydronephrosis on the right. Adrenal glands and urinary bladder unremarkable. Stomach/Bowel: Large stool burden throughout the colon. Stomach, large and small bowel grossly unremarkable. Vascular/Lymphatic: Aortic atherosclerosis. No evidence of aneurysm or adenopathy. Reproductive: Uterus and adnexa unremarkable.  No mass. Other: No free fluid or free air. Musculoskeletal: No acute bony abnormality. IMPRESSION: Mild left hydronephrosis due to 2 1 mm left UVJ stone. Gallbladder distension. Aortic  atherosclerosis. Large stool burden throughout the colon. Electronically Signed   By: Franky Crease M.D.   On: 07/26/2023 21:06    Procedures Procedures    CRITICAL CARE Performed by: Alm DEL Patt   Total critical care time: 38 minutes  Critical care time was exclusive of separately billable procedures and treating other patients.  Critical care was necessary to treat or prevent imminent or life-threatening deterioration.  Critical care was time spent personally by me on the following activities: development of treatment plan with patient and/or surrogate as well as nursing, discussions with consultants, evaluation of patient's response to treatment, examination of patient, obtaining history from patient or surrogate, ordering and performing treatments and interventions, ordering and review of laboratory studies, ordering and review of radiographic studies, pulse oximetry and re-evaluation of patient's condition.   Medications Ordered in ED Medications  sodium chloride  0.9 % bolus 1,000 mL (1,000 mLs Intravenous New Bag/Given 07/26/23 2005)  HYDROmorphone  (DILAUDID ) injection 1 mg (1 mg Intravenous Given 07/26/23 2006)  ondansetron  (ZOFRAN ) injection 4 mg (4 mg Intravenous Given 07/26/23 2006)    ED Course/ Medical Decision Making/ A&P                                 Medical Decision Making ALEXY BRINGLE is a 73 y.o. female here presenting with left flank pain.  Concern for possible recurrent kidney stone.  Plan to get CBC and CMP and UA and CT abdomen pelvis.  Patient was tachycardic on arrival so sepsis workup was initiated and lactate and cultures were sent.  11:14 PM WBC is 18.  CT showed 2 mm stone at the left UVJ with hydronephrosis.  UA unfortunately showed greater than 50 white blood cell count and many bacteria.  I discussed case with Dr. Shona from urology.  He reviewed the images and her chart.  He recommend IV antibiotics and medicine admission.  He request patient be n.p.o.  after midnight and strain her urine to look for the stone.  He  states that urology will see patient in the morning and made to stent  Problems Addressed: Acute pyelonephritis: acute illness or injury Kidney stone: acute illness or injury  Amount and/or Complexity of Data Reviewed Labs: ordered. Decision-making details documented in ED Course. Radiology: ordered and independent interpretation performed. Decision-making details documented in ED Course.  Risk Prescription drug management.    Final Clinical Impression(s) / ED Diagnoses Final diagnoses:  None    Rx / DC Orders ED Discharge Orders     None         Patt Alm Macho, MD 07/26/23 2319

## 2023-07-26 NOTE — H&P (Signed)
 History and Physical    Teresa Lozano FMW:[id number] DOB: May 06, 1951 DOA: 07/26/2023  PCP: Vernadine Charlie ORN, MD  Patient coming from: Home  Chief Complaint: Left flank pain  HPI: Teresa Lozano is a 73 y.o. female with medical history significant of nephrolithiasis, hypertension, hyperlipidemia, GEJ stricture status post dilation, GERD, seizure disorder, migraine headaches presented to ED with complaints of left flank pain, dysuria, nausea, and vomiting.  Tachycardic to the 130s on arrival but afebrile.  Not hypotensive.  Labs notable for WBC count 18.9, creatinine 1.1 (at baseline), lactic acid normal, blood cultures collected.  UA with positive nitrite, moderate leukocytes, and microscopy showing 11-20 RBCs, >50 WBCs, and many bacteria.  Urine culture pending.  CT renal stone study showing mild left hydronephrosis due to a punctate 1 mm stone at the left UVJ. EDP consulted urology (Dr. Shona) who recommended admission for IV antibiotics, keeping n.p.o. after midnight, and straining the patient's urine to look for the stone.  Urology will see the patient in the morning as she may need a ureteral stent if she does not pass the stone.  Patient was given Dilaudid , Zofran , ceftriaxone , and 1 L IV fluids.  Patient is reporting severe left-sided flank pain, dysuria, nausea, and vomiting for the past 3 days.  She is concerned that she is very dehydrated as she has not been able to tolerate any p.o. intake.  Denies fevers or chills.  She reports history of kidney stones in the past and had lithotripsy done several years ago.  No other complaints.  Denies cough, shortness breath, or chest pain.  Review of Systems:  Review of Systems  All other systems reviewed and are negative.   Past Medical History:  Diagnosis Date   Allergy    Arthritis    Chicken pox    Frequent headaches    GERD (gastroesophageal reflux disease)    History of kidney stones    Hyperlipidemia    Hypertension    IDA (iron   deficiency anemia)    Osteoporosis     Past Surgical History:  Procedure Laterality Date   CERVICAL FUSION  2000   COLONOSCOPY  01/18/2019   COLONOSCOPY WITH PROPOFOL   08/23/2022   ESOPHAGOGASTRODUODENOSCOPY  11/2016   ESOPHAGOGASTRODUODENOSCOPY (EGD) WITH PROPOFOL  N/A 12/28/2016   Procedure: ESOPHAGOGASTRODUODENOSCOPY (EGD) WITH PROPOFOL ;  Surgeon: Leigh Elspeth Mt, MD;  Location: [facility];  Service: Gastroenterology;  Laterality: N/A;   EYE SURGERY     STOMACH SURGERY  04/2017   REMOVED ULCER   TONSILLECTOMY     UPPER GASTROINTESTINAL ENDOSCOPY  07/2022     reports that she has never smoked. She has never used smokeless tobacco. She reports that she does not drink alcohol  and does not use drugs.  Allergies  Allergen Reactions   Lisinopril  Other (See Comments) and Cough    Coughing all  day/night per patient   Robaxin  [Methocarbamol ] Nausea And Vomiting    Family History  Problem Relation Age of Onset   Hyperlipidemia Mother    Hypertension Mother    Prostate cancer Father    Hyperlipidemia Father    Heart disease Father    Heart disease Brother    Hypertension Sister    Esophageal cancer Sister    Hypertension Maternal Grandmother    Colon cancer Neg Hx    Colon polyps Neg Hx    Stomach cancer Neg Hx    Rectal cancer Neg Hx     Prior to Admission medications   Medication Sig Start  Date End Date Taking? Authorizing Provider  amLODipine  (NORVASC ) 10 MG tablet Take 10 mg by mouth daily. 06/25/22  Yes [provider]  butalbital -acetaminophen -caffeine  (FIORICET) 50-325-40 MG tablet One to two tablets by mouth as needed for headache. No more than 4/week Patient taking differently: Take 1-2 tablets by mouth daily as needed for headache (NOT TO EXCEED 4 TABLETS/WEEK). One to two tablets by mouth as needed for headache. No more than 4/week 06/02/23  Yes Lomax, Amy, NP  cetirizine (ZYRTEC) 10 MG tablet Take 10 mg by mouth daily.   Yes [provider]  Cholecalciferol (VITAMIN D3) 125 MCG (5000 UT) CAPS Take 5,000 Units by mouth daily.   Yes [provider]  CRANBERRY PO Take 650 mg by mouth daily.   Yes [provider]  cyclobenzaprine  (FLEXERIL ) 5 MG tablet Take 1 tablet (5 mg total) by mouth 2 (two) times daily as needed for muscle spasms. Patient taking differently: Take 10 mg by mouth at bedtime. 06/02/23  Yes Lomax, Amy, NP  ferrous sulfate  325 (65 FE) MG tablet TAKE 1 TABLET BY MOUTH EVERY DAY WITH BREAKFAST Patient taking differently: Take 325 mg by mouth daily with breakfast. 05/05/23  Yes Armbruster, Elspeth SQUIBB, MD  imipramine  (TOFRANIL ) 25 MG tablet Take 3 tablets (75 mg total) by mouth at bedtime. 06/02/23  Yes Lomax, Amy, NP  levETIRAcetam  (KEPPRA ) 500 MG tablet Take 1 tablet (500 mg total) by mouth 2 (two) times daily. 06/02/23  Yes Lomax, Amy, NP  Magnesium  500 MG CAPS Take 500 mg by mouth in the morning.   Yes [provider]  Multiple Vitamin (MULTI-VITAMIN DAILY PO) Take 1 tablet by mouth daily with breakfast.   Yes [provider]  omeprazole  (PRILOSEC) 40 MG capsule TAKE 1 CAPSULE BY MOUTH TWICE A DAY 05/05/23  Yes Kennedy-Smith, Colleen M, NP  ondansetron  (ZOFRAN ) 4 MG tablet Take 1 tablet (4 mg total) by mouth every 8 (eight) hours as needed. Patient taking differently: Take 4 mg by mouth every 8 (eight) hours as needed for nausea or vomiting. 07/18/23  Yes Lomax, Amy, NP  polyethylene glycol (MIRALAX  / GLYCOLAX ) packet Take 17 g by mouth daily.   Yes [provider]  RECLAST  5 MG/100ML SOLN injection Inject 5 mg into the vein once.   Yes [provider]  rosuvastatin  (CRESTOR ) 10 MG tablet Take 10 mg by mouth daily. 08/21/21  Yes [provider]  sucralfate  (CARAFATE ) 1 g tablet Take 1 tablet (1 g total) by mouth as needed. Patient taking differently: Take 1 g by mouth daily as needed (for GERD-like symptoms). 02/04/23  Yes Armbruster, Elspeth SQUIBB, MD   SUMAtriptan  (IMITREX ) 100 MG tablet TAKE 1 TAB AS NEEDED FOR MIGRAINE. MAY REPEAT IN 2 HRS IF HEADACHE PERSISTS OR RECURS. Patient taking differently: Take 100 mg by mouth See admin instructions. TAKE 100 MG BY MOUTH AS NEEDED FOR MIGRAINE. MAY REPEAT IN 2 HRS IF HEADACHE PERSISTS OR RECURS. 06/02/23  Yes Lomax, Amy, NP  metoCLOPramide  (REGLAN ) 5 MG tablet Take 1 tablet (5 mg total) by mouth 3 (three) times daily as needed for nausea. Patient not taking: Reported on 07/26/2023 07/08/22   Leigh Elspeth SQUIBB, MD  predniSONE  (STERAPRED UNI-PAK 21 TAB) 10 MG (21) TBPK tablet Take as directed with food Patient not taking: Reported on 07/11/2023 06/02/23   [place] No, NP    Physical Exam: Vitals:   07/26/23 1924 07/26/23 1927 07/26/23 2105  BP:  133/84 118/72  Pulse:  ROLLEN)  137 (!) 109  Resp:  (!) 22 20  Temp: 98.5 F (36.9 C) 98.5 F (36.9 C) 99.4 F (37.4 C)  TempSrc: Oral Oral Oral  SpO2:  94% 99%  Weight: 45 kg    Height: 5' 2 (1.575 m)      Physical Exam Vitals reviewed.  Constitutional:      General: She is not in acute distress. HENT:     Head: Normocephalic and atraumatic.  Eyes:     Extraocular Movements: Extraocular movements intact.  Cardiovascular:     Rate and Rhythm: Regular rhythm. Tachycardia present.     Pulses: Normal pulses.  Pulmonary:     Effort: Pulmonary effort is normal. No respiratory distress.     Breath sounds: Normal breath sounds. No wheezing or rales.  Abdominal:     General: Bowel sounds are normal. There is no distension.     Palpations: Abdomen is soft.     Tenderness: There is no abdominal tenderness. There is no guarding.  Musculoskeletal:     Cervical back: Normal range of motion.     Right lower leg: No edema.     Left lower leg: No edema.  Skin:    General: Skin is warm and dry.  Neurological:     General: No focal deficit present.     Mental Status: She is alert and oriented to person, place, and time.     Labs on Admission: I  have personally reviewed following labs and imaging studies  CBC: Recent Labs  Lab 07/26/23 2021 07/26/23 2039  WBC 18.9*  --   NEUTROABS 17.0*  --   HGB 13.3 13.9  HCT 42.6 41.0  MCV 96.2  --   PLT 301  --    Basic Metabolic Panel: Recent Labs  Lab 07/26/23 2021 07/26/23 2039  NA 135 137  K 3.5 3.6  CL 98 100  CO2 23  --   GLUCOSE 135* 125*  BUN 25* 25*  CREATININE 1.11* 1.30*  CALCIUM  8.8*  --    GFR: Estimated Creatinine Clearance: 28.2 mL/min (A) (by C-G formula based on SCr of 1.3 mg/dL (H)). Liver Function Tests: Recent Labs  Lab 07/26/23 2021  AST 15  ALT 12  ALKPHOS 81  BILITOT 0.8  PROT 7.3  ALBUMIN 3.1*   No results for input(s): LIPASE, AMYLASE in the last 168 hours. No results for input(s): AMMONIA in the last 168 hours. Coagulation Profile: No results for input(s): INR, PROTIME in the last 168 hours. Cardiac Enzymes: No results for input(s): CKTOTAL, CKMB, CKMBINDEX, TROPONINI in the last 168 hours. BNP (last 3 results) No results for input(s): PROBNP in the last 8760 hours. HbA1C: No results for input(s): HGBA1C in the last 72 hours. CBG: No results for input(s): GLUCAP in the last 168 hours. Lipid Profile: No results for input(s): CHOL, HDL, LDLCALC, TRIG, CHOLHDL, LDLDIRECT in the last 72 hours. Thyroid  Function Tests: No results for input(s): TSH, T4TOTAL, FREET4, T3FREE, THYROIDAB in the last 72 hours. Anemia Panel: No results for input(s): VITAMINB12, FOLATE, FERRITIN, TIBC, IRON , RETICCTPCT in the last 72 hours. Urine analysis:    Component Value Date/Time   COLORURINE YELLOW 07/26/2023 2209   APPEARANCEUR CLOUDY (A) 07/26/2023 2209   LABSPEC 1.024 07/26/2023 2209   PHURINE 5.0 07/26/2023 2209   GLUCOSEU NEGATIVE 07/26/2023 2209   HGBUR MODERATE (A) 07/26/2023 2209   BILIRUBINUR NEGATIVE 07/26/2023 2209   BILIRUBINUR neg 12/24/2019 1558   KETONESUR 20 (A) 07/26/2023 2209  PROTEINUR 100 (A) 07/26/2023 2209   UROBILINOGEN 1.0 03/20/2020 1154   NITRITE POSITIVE (A) 07/26/2023 2209   LEUKOCYTESUR MODERATE (A) 07/26/2023 2209    Radiological Exams on Admission: CT Renal Stone Study Result Date: 07/26/2023 CLINICAL DATA:  Left flank pain EXAM: CT ABDOMEN AND PELVIS WITHOUT CONTRAST TECHNIQUE: Multidetector CT imaging of the abdomen and pelvis was performed following the standard protocol without IV contrast. RADIATION DOSE REDUCTION: This exam was performed according to the departmental dose-optimization program which includes automated exposure control, adjustment of the mA and/or kV according to patient size and/or use of iterative reconstruction technique. COMPARISON:  07/24/2021 FINDINGS: Lower chest: No acute abnormality Hepatobiliary: Gallbladder distended. No visible stones or biliary ductal dilatation. No focal hepatic abnormality. Pancreas: No focal abnormality or ductal dilatation. Spleen: No focal abnormality.  Normal size. Adrenals/Urinary Tract: Bilateral renal cysts appear benign. No follow-up imaging recommended. Mild left hydronephrosis. Punctate 1 mm stone at the left UVJ. No stones or hydronephrosis on the right. Adrenal glands and urinary bladder unremarkable. Stomach/Bowel: Large stool burden throughout the colon. Stomach, large and small bowel grossly unremarkable. Vascular/Lymphatic: Aortic atherosclerosis. No evidence of aneurysm or adenopathy. Reproductive: Uterus and adnexa unremarkable.  No mass. Other: No free fluid or free air. Musculoskeletal: No acute bony abnormality. IMPRESSION: Mild left hydronephrosis due to 2 1 mm left UVJ stone. Gallbladder distension. Aortic atherosclerosis. Large stool burden throughout the colon. Electronically Signed   By: Franky Crease M.D.   On: 07/26/2023 21:06    EKG: Independently reviewed.  Sinus tachycardia, no acute ischemic changes.  Assessment and Plan  Sepsis secondary to complicated UTI/infected kidney  stone Patient presenting with complaints of severe left flank pain, dysuria, nausea, and vomiting x 3 days. UA with positive nitrite, moderate leukocytes, and microscopy showing 11-20 RBCs, >50 WBCs, and many bacteria. CT renal stone study showing mild left hydronephrosis due to a punctate 1 mm stone at the left UVJ.  Creatinine stable.  Meets criteria for sepsis with tachycardia and leukocytosis.  No lactic acidosis or hypotension to suggest severe sepsis.  Urology consulted and recommended continuing IV antibiotics and straining the patient's urine to look for the stone.  If she does not pass the stone, then may need a ureteral stent.  Recommended keeping n.p.o. after midnight and will see the patient in the morning. -Continue ceftriaxone  -Still tachycardic, continue IV fluid hydration -Keep n.p.o. after midnight -Continue pain management -Antiemetic as needed -Trend WBC count and lactate -Monitor renal function -Follow-up urine and blood cultures  Seizure disorder Currently n.p.o./unable to tolerate p.o. intake due to nausea/vomiting.  Continue current dose of home Keppra  in IV form.  Hypertension Avoid antihypertensives at this time given concern for sepsis.  Hyperlipidemia Continue home medication when no longer n.p.o./able to tolerate p.o. intake.  DVT prophylaxis: SCDs Code Status: Full Code (discussed with the patient) Level of care: Telemetry bed Admission status: It is my clinical opinion that admission to INPATIENT is reasonable and necessary because of the expectation that this patient will require hospital care that crosses at least 2 midnights to treat this condition based on the medical complexity of the problems presented.  Given the aforementioned information, the predictability of an adverse outcome is felt to be significant.  Editha Ram MD [facility]  If 7PM-7AM, please contact night-coverage [email or web address]  07/26/2023, 11:18 PM

## 2023-07-26 NOTE — ED Triage Notes (Signed)
 Pt arrived reporting left flank pain with vomiting x1 week. States it has gotten worse. Has hx of kidney stones. Endorses pain is similar to stone. No other symptoms reported

## 2023-07-26 NOTE — Consult Note (Signed)
 Urology Consult   Physician requesting consult: Dr. Patt  Reason for consult: nephrolithiasis/UTI  History of Present Illness: Teresa Lozano is a 73 y.o. with h/o prior nephrolithiasis s/p prior lithotripsy; last stone event 09/2021 when she passed a 4mm left ureteral stone.  Followed by Dr. Carolee. Patient presented today to ED with 1wk h/o intermittent left flank pain and n/v.  No reports of fever or chills.  T99 in ED. CT stone study reveals mild left hydro and a punctate Ca++ (1mm) at left uvj. UA appears infected and patient has leukocytosis (18.9). Patient tachy but not hypotensive and lactate normal.   Past Medical History:  Diagnosis Date   Allergy    Arthritis    Chicken pox    Frequent headaches    GERD (gastroesophageal reflux disease)    History of kidney stones    Hyperlipidemia    Hypertension    IDA (iron  deficiency anemia)    Osteoporosis     Past Surgical History:  Procedure Laterality Date   CERVICAL FUSION  2000   COLONOSCOPY  01/18/2019   COLONOSCOPY WITH PROPOFOL   08/23/2022   ESOPHAGOGASTRODUODENOSCOPY  11/2016   ESOPHAGOGASTRODUODENOSCOPY (EGD) WITH PROPOFOL  N/A 12/28/2016   Procedure: ESOPHAGOGASTRODUODENOSCOPY (EGD) WITH PROPOFOL ;  Surgeon: Leigh Elspeth Mt, MD;  Location: [facility];  Service: Gastroenterology;  Laterality: N/A;   EYE SURGERY     STOMACH SURGERY  04/2017   REMOVED ULCER   TONSILLECTOMY     UPPER GASTROINTESTINAL ENDOSCOPY  07/2022    Medications:  Home meds:    Scheduled Meds: Continuous Infusions:  sodium chloride      cefTRIAXone  (ROCEPHIN )  IV     PRN Meds:.  Allergies:  Allergies  Allergen Reactions   Lisinopril  Other (See Comments) and Cough    Coughing all  day/night per patient   Robaxin  [Methocarbamol ] Nausea And Vomiting    Family History  Problem Relation Age of Onset   Hyperlipidemia Mother    Hypertension Mother    Prostate cancer Father    Hyperlipidemia Father    Heart disease Father     Heart disease Brother    Hypertension Sister    Esophageal cancer Sister    Hypertension Maternal Grandmother    Colon cancer Neg Hx    Colon polyps Neg Hx    Stomach cancer Neg Hx    Rectal cancer Neg Hx     Social History:  reports that she has never smoked. She has never used smokeless tobacco. She reports that she does not drink alcohol  and does not use drugs.  ROS: A complete review of systems was performed.  All systems are negative except for pertinent findings as noted.  Physical Exam:  Vital signs in last 24 hours: Temp:  [98.5 F (36.9 C)-99.4 F (37.4 C)] 99.4 F (37.4 C) (01/07 2105) Pulse Rate:  [109-137] 109 (01/07 2105) Resp:  [20-22] 20 (01/07 2105) BP: (118-133)/(72-84) 118/72 (01/07 2105) SpO2:  [94 %-99 %] 99 % (01/07 2105) Weight:  [45 kg] 45 kg (01/07 1924) Constitutional:  Alert and oriented, No acute distress Cardiovascular: Tachy Respiratory: Normal respiratory effort, Lungs clear bilaterally GI: Abdomen is soft, nontender, nondistended, no abdominal masses Lymphatic: No lymphadenopathy Neurologic: Grossly intact, no focal deficits Psychiatric: Normal mood and affect  Laboratory Data:  Recent Labs    07/26/23 2021 07/26/23 2039  WBC 18.9*  --   HGB 13.3 13.9  HCT 42.6 41.0  PLT 301  --     Recent Labs  07/26/23 2021 07/26/23 2039  NA 135 137  K 3.5 3.6  CL 98 100  GLUCOSE 135* 125*  BUN 25* 25*  CALCIUM  8.8*  --   CREATININE 1.11* 1.30*     Results for orders placed or performed during the hospital encounter of 07/26/23 (from the past 24 hours)  Comprehensive metabolic panel     Status: Abnormal   Collection Time: 07/26/23  8:21 PM  Result Value Ref Range   Sodium 135 135 - 145 mmol/L   Potassium 3.5 3.5 - 5.1 mmol/L   Chloride 98 98 - 111 mmol/L   CO2 23 22 - 32 mmol/L   Glucose, Bld 135 (H) 70 - 99 mg/dL   BUN 25 (H) 8 - 23 mg/dL   Creatinine, Ser 8.88 (H) 0.44 - 1.00 mg/dL   Calcium  8.8 (L) 8.9 - 10.3 mg/dL   Total  Protein 7.3 6.5 - 8.1 g/dL   Albumin 3.1 (L) 3.5 - 5.0 g/dL   AST 15 15 - 41 U/L   ALT 12 0 - 44 U/L   Alkaline Phosphatase 81 38 - 126 U/L   Total Bilirubin 0.8 0.0 - 1.2 mg/dL   GFR, Estimated 53 (L) >60 mL/min   Anion gap 14 5 - 15  CBC with Differential     Status: Abnormal   Collection Time: 07/26/23  8:21 PM  Result Value Ref Range   WBC 18.9 (H) 4.0 - 10.5 K/uL   RBC 4.43 3.87 - 5.11 MIL/uL   Hemoglobin 13.3 12.0 - 15.0 g/dL   HCT 57.3 63.9 - 53.9 %   MCV 96.2 80.0 - 100.0 fL   MCH 30.0 26.0 - 34.0 pg   MCHC 31.2 30.0 - 36.0 g/dL   RDW 86.4 88.4 - 84.4 %   Platelets 301 150 - 400 K/uL   nRBC 0.0 0.0 - 0.2 %   Neutrophils Relative % 91 %   Neutro Abs 17.0 (H) 1.7 - 7.7 K/uL   Lymphocytes Relative 2 %   Lymphs Abs 0.4 (L) 0.7 - 4.0 K/uL   Monocytes Relative 6 %   Monocytes Absolute 1.1 (H) 0.1 - 1.0 K/uL   Eosinophils Relative 0 %   Eosinophils Absolute 0.1 0.0 - 0.5 K/uL   Basophils Relative 0 %   Basophils Absolute 0.1 0.0 - 0.1 K/uL   Immature Granulocytes 1 %   Abs Immature Granulocytes 0.12 (H) 0.00 - 0.07 K/uL  I-stat chem 8, ED     Status: Abnormal   Collection Time: 07/26/23  8:39 PM  Result Value Ref Range   Sodium 137 135 - 145 mmol/L   Potassium 3.6 3.5 - 5.1 mmol/L   Chloride 100 98 - 111 mmol/L   BUN 25 (H) 8 - 23 mg/dL   Creatinine, Ser 8.69 (H) 0.44 - 1.00 mg/dL   Glucose, Bld 874 (H) 70 - 99 mg/dL   Calcium , Ion 1.07 (L) 1.15 - 1.40 mmol/L   TCO2 25 22 - 32 mmol/L   Hemoglobin 13.9 12.0 - 15.0 g/dL   HCT 58.9 63.9 - 53.9 %  I-Stat Lactic Acid     Status: None   Collection Time: 07/26/23  8:40 PM  Result Value Ref Range   Lactic Acid, Venous 1.6 0.5 - 1.9 mmol/L  Urinalysis, w/ Reflex to Culture (Infection Suspected) -Urine, Clean Catch     Status: Abnormal   Collection Time: 07/26/23 10:09 PM  Result Value Ref Range   Specimen Source URINE, CLEAN CATCH  Color, Urine YELLOW YELLOW   APPearance CLOUDY (A) CLEAR   Specific Gravity, Urine  1.024 1.005 - 1.030   pH 5.0 5.0 - 8.0   Glucose, UA NEGATIVE NEGATIVE mg/dL   Hgb urine dipstick MODERATE (A) NEGATIVE   Bilirubin Urine NEGATIVE NEGATIVE   Ketones, ur 20 (A) NEGATIVE mg/dL   Protein, ur 899 (A) NEGATIVE mg/dL   Nitrite POSITIVE (A) NEGATIVE   Leukocytes,Ua MODERATE (A) NEGATIVE   RBC / HPF 11-20 0 - 5 RBC/hpf   WBC, UA >50 0 - 5 WBC/hpf   Bacteria, UA MANY (A) NONE SEEN   Squamous Epithelial / HPF 0-5 0 - 5 /HPF   WBC Clumps PRESENT    Non Squamous Epithelial 0-5 (A) NONE SEEN   No results found for this or any previous visit (from the past 240 hours).  Renal Function: Recent Labs    07/26/23 2021 07/26/23 2039  CREATININE 1.11* 1.30*   Estimated Creatinine Clearance: 28.2 mL/min (A) (by C-G formula based on SCr of 1.3 mg/dL (H)).  Radiologic Imaging: CT Renal Stone Study Result Date: 07/26/2023 CLINICAL DATA:  Left flank pain EXAM: CT ABDOMEN AND PELVIS WITHOUT CONTRAST TECHNIQUE: Multidetector CT imaging of the abdomen and pelvis was performed following the standard protocol without IV contrast. RADIATION DOSE REDUCTION: This exam was performed according to the departmental dose-optimization program which includes automated exposure control, adjustment of the mA and/or kV according to patient size and/or use of iterative reconstruction technique. COMPARISON:  07/24/2021 FINDINGS: Lower chest: No acute abnormality Hepatobiliary: Gallbladder distended. No visible stones or biliary ductal dilatation. No focal hepatic abnormality. Pancreas: No focal abnormality or ductal dilatation. Spleen: No focal abnormality.  Normal size. Adrenals/Urinary Tract: Bilateral renal cysts appear benign. No follow-up imaging recommended. Mild left hydronephrosis. Punctate 1 mm stone at the left UVJ. No stones or hydronephrosis on the right. Adrenal glands and urinary bladder unremarkable. Stomach/Bowel: Large stool burden throughout the colon. Stomach, large and small bowel grossly  unremarkable. Vascular/Lymphatic: Aortic atherosclerosis. No evidence of aneurysm or adenopathy. Reproductive: Uterus and adnexa unremarkable.  No mass. Other: No free fluid or free air. Musculoskeletal: No acute bony abnormality. IMPRESSION: Mild left hydronephrosis due to 2 1 mm left UVJ stone. Gallbladder distension. Aortic atherosclerosis. Large stool burden throughout the colon. Electronically Signed   By: Franky Crease M.D.   On: 07/26/2023 21:06    I independently reviewed the above imaging studies.  Impression/Recommendation 1mm left uvj stone with mild hydro and UTI Admit to medicine Aggressive hydration Keep npo Strain all urine  I had a long discussion with the patient and her grandson regarding her clinical situation and recommendations for urinary tract drainage with placement of a left ureteral stent.  Rationale as well as nature procedure reviewed in detail including potential adverse events and complications.  Patient agrees to proceed.  Informed consent obtained.    Layman JAYSON Hurst 07/26/2023, 11:08 PM

## 2023-07-26 NOTE — ED Provider Triage Note (Signed)
 Emergency Medicine Provider Triage Evaluation Note  Teresa Lozano , a 73 y.o. female  was evaluated in triage.  Pt complains of left flank pain.  Patient states that she had a CT scan that showed that she had a kidney stone and that this is not changed.  Patient states that pain is not resolved and now she is having burning when she urinates.  Patient denies fevers but states that she has not been to keep any food or fluids down.  Patient denies chest pain or shortness of breath.  Review of Systems  Positive:  Negative:   Physical Exam  BP 133/84   Pulse (!) 137   Temp 98.5 F (36.9 C) (Oral)   Resp (!) 22   Ht 5' 2 (1.575 m)   Wt 45 kg   SpO2 94%   BMI 18.15 kg/m  Gen:   Awake, no distress   Resp:  Normal effort  MSK:   Moves extremities without difficulty  Other:  Tachycardic, L CVA tenderness  Medical Decision Making  Medically screening exam initiated at 7:36 PM.  Appropriate orders placed.  Niels KATHEE Minus was informed that the remainder of the evaluation will be completed by another provider, this initial triage assessment does not replace that evaluation, and the importance of remaining in the ED until their evaluation is complete.  Workup initiated, suspect urosepsis given history of dysuria with stone along with tachycardia, patient will be room next.  Patient stable at this time.   Victor Lynwood DASEN, [place] 07/26/23 [phone]

## 2023-07-27 ENCOUNTER — Inpatient Hospital Stay (HOSPITAL_COMMUNITY): Payer: Medicare Other | Admitting: Certified Registered Nurse Anesthetist

## 2023-07-27 ENCOUNTER — Inpatient Hospital Stay (HOSPITAL_COMMUNITY): Payer: Medicare Other

## 2023-07-27 ENCOUNTER — Encounter (HOSPITAL_COMMUNITY)
Admission: EM | Disposition: A | Discharge status: Home / Self Care | Payer: Self-pay | Source: Home / Self Care | Attending: Family Medicine

## 2023-07-27 ENCOUNTER — Encounter (HOSPITAL_COMMUNITY): Payer: Self-pay | Admitting: Internal Medicine

## 2023-07-27 ENCOUNTER — Ambulatory Visit: Payer: Self-pay | Admitting: Urology

## 2023-07-27 DIAGNOSIS — N201 Calculus of ureter: Secondary | ICD-10-CM | POA: Diagnosis not present

## 2023-07-27 DIAGNOSIS — I129 Hypertensive chronic kidney disease with stage 1 through stage 4 chronic kidney disease, or unspecified chronic kidney disease: Secondary | ICD-10-CM | POA: Diagnosis not present

## 2023-07-27 DIAGNOSIS — N10 Acute tubulo-interstitial nephritis: Secondary | ICD-10-CM

## 2023-07-27 DIAGNOSIS — N390 Urinary tract infection, site not specified: Secondary | ICD-10-CM | POA: Diagnosis not present

## 2023-07-27 DIAGNOSIS — I10 Essential (primary) hypertension: Secondary | ICD-10-CM | POA: Diagnosis not present

## 2023-07-27 DIAGNOSIS — E785 Hyperlipidemia, unspecified: Secondary | ICD-10-CM | POA: Diagnosis not present

## 2023-07-27 DIAGNOSIS — G40909 Epilepsy, unspecified, not intractable, without status epilepticus: Secondary | ICD-10-CM

## 2023-07-27 DIAGNOSIS — A419 Sepsis, unspecified organism: Secondary | ICD-10-CM

## 2023-07-27 DIAGNOSIS — N183 Chronic kidney disease, stage 3 unspecified: Secondary | ICD-10-CM | POA: Diagnosis not present

## 2023-07-27 DIAGNOSIS — D509 Iron deficiency anemia, unspecified: Secondary | ICD-10-CM | POA: Diagnosis not present

## 2023-07-27 HISTORY — PX: CYSTOSCOPY/RETROGRADE/URETEROSCOPY: SHX5316

## 2023-07-27 LAB — CBC
HCT: 35 % — ABNORMAL LOW (ref 36.0–46.0)
Hemoglobin: 10.9 g/dL — ABNORMAL LOW (ref 12.0–15.0)
MCH: 30.3 pg (ref 26.0–34.0)
MCHC: 31.1 g/dL (ref 30.0–36.0)
MCV: 97.2 fL (ref 80.0–100.0)
Platelets: 256 10*3/uL (ref 150–400)
RBC: 3.6 MIL/uL — ABNORMAL LOW (ref 3.87–5.11)
RDW: 13.6 % (ref 11.5–15.5)
WBC: 16.7 10*3/uL — ABNORMAL HIGH (ref 4.0–10.5)
nRBC: 0 % (ref 0.0–0.2)

## 2023-07-27 LAB — BASIC METABOLIC PANEL
Anion gap: 11 (ref 5–15)
BUN: 19 mg/dL (ref 8–23)
CO2: 22 mmol/L (ref 22–32)
Calcium: 7.9 mg/dL — ABNORMAL LOW (ref 8.9–10.3)
Chloride: 104 mmol/L (ref 98–111)
Creatinine, Ser: 0.77 mg/dL (ref 0.44–1.00)
GFR, Estimated: >60 mL/min (ref >60)
Glucose, Bld: 118 mg/dL — ABNORMAL HIGH (ref 70–99)
Potassium: 3.6 mmol/L (ref 3.5–5.1)
Sodium: 137 mmol/L (ref 135–145)

## 2023-07-27 SURGERY — CYSTOSCOPY/RETROGRADE/URETEROSCOPY
Anesthesia: General | Laterality: Left

## 2023-07-27 MED ORDER — LIDOCAINE 2% (20 MG/ML) 5 ML SYRINGE
INTRAMUSCULAR | Status: DC | PRN
  Administered 2023-07-27: 60 mg via INTRAVENOUS

## 2023-07-27 MED ORDER — SUCCINYLCHOLINE CHLORIDE 200 MG/10ML IV SOSY
PREFILLED_SYRINGE | INTRAVENOUS | Status: AC
  Filled 2023-07-27: qty 10

## 2023-07-27 MED ORDER — 0.9 % SODIUM CHLORIDE (POUR BTL) OPTIME
TOPICAL | Status: DC | PRN
Start: 2023-07-27 — End: 2023-07-27
  Administered 2023-07-27: 1000 mL

## 2023-07-27 MED ORDER — PROPOFOL 10 MG/ML IV BOLUS
INTRAVENOUS | Status: DC | PRN
  Administered 2023-07-27 (×2): 50 mg via INTRAVENOUS
  Administered 2023-07-27: 100 mg via INTRAVENOUS

## 2023-07-27 MED ORDER — PHENYLEPHRINE 80 MCG/ML (10ML) SYRINGE FOR IV PUSH (FOR BLOOD PRESSURE SUPPORT)
PREFILLED_SYRINGE | INTRAVENOUS | Status: DC | PRN
  Administered 2023-07-27: 80 ug via INTRAVENOUS

## 2023-07-27 MED ORDER — CYCLOBENZAPRINE HCL 5 MG PO TABS
5.0000 mg | ORAL_TABLET | Freq: Two times a day (BID) | ORAL | Status: DC | PRN
  Administered 2023-07-28: 5 mg via ORAL
  Filled 2023-07-27: qty 1

## 2023-07-27 MED ORDER — ONDANSETRON HCL 4 MG/2ML IJ SOLN
INTRAMUSCULAR | Status: DC | PRN
  Administered 2023-07-27: 4 mg via INTRAVENOUS

## 2023-07-27 MED ORDER — ONDANSETRON HCL 4 MG/2ML IJ SOLN
INTRAMUSCULAR | Status: AC
  Filled 2023-07-27: qty 2

## 2023-07-27 MED ORDER — MELATONIN 5 MG PO TABS
5.0000 mg | ORAL_TABLET | Freq: Every evening | ORAL | Status: DC | PRN
  Administered 2023-07-27: 5 mg via ORAL
  Filled 2023-07-27 (×2): qty 1

## 2023-07-27 MED ORDER — FENTANYL CITRATE (PF) 100 MCG/2ML IJ SOLN
INTRAMUSCULAR | Status: AC
  Filled 2023-07-27: qty 2

## 2023-07-27 MED ORDER — CHLORHEXIDINE GLUCONATE 0.12 % MT SOLN
15.0000 mL | Freq: Once | OROMUCOSAL | Status: AC
  Administered 2023-07-27: 15 mL via OROMUCOSAL

## 2023-07-27 MED ORDER — FERROUS SULFATE 325 (65 FE) MG PO TABS
325.0000 mg | ORAL_TABLET | Freq: Every day | ORAL | Status: DC
  Administered 2023-07-28: 325 mg via ORAL
  Filled 2023-07-27: qty 1

## 2023-07-27 MED ORDER — DEXAMETHASONE SODIUM PHOSPHATE 10 MG/ML IJ SOLN
INTRAMUSCULAR | Status: AC
  Filled 2023-07-27: qty 1

## 2023-07-27 MED ORDER — OXYCODONE HCL 5 MG PO TABS: 5.0000 mg | ORAL_TABLET | Freq: Once | ORAL | Status: DC | PRN

## 2023-07-27 MED ORDER — OXYCODONE HCL 5 MG/5ML PO SOLN: 5.0000 mg | Freq: Once | ORAL | Status: DC | PRN

## 2023-07-27 MED ORDER — FENTANYL CITRATE PF 50 MCG/ML IJ SOSY: 25.0000 ug | PREFILLED_SYRINGE | INTRAMUSCULAR | Status: DC | PRN

## 2023-07-27 MED ORDER — LEVETIRACETAM IN NACL 500 MG/100ML IV SOLN
500.0000 mg | Freq: Two times a day (BID) | INTRAVENOUS | Status: DC
  Administered 2023-07-27: 500 mg via INTRAVENOUS
  Filled 2023-07-27: qty 100

## 2023-07-27 MED ORDER — IOHEXOL 300 MG/ML  SOLN
INTRAMUSCULAR | Status: DC | PRN
  Administered 2023-07-27: 10 mL

## 2023-07-27 MED ORDER — SODIUM CHLORIDE 0.9 % IV SOLN
2.0000 g | INTRAVENOUS | Status: DC
  Administered 2023-07-27: 2 g via INTRAVENOUS
  Filled 2023-07-27: qty 20

## 2023-07-27 MED ORDER — PROPOFOL 10 MG/ML IV BOLUS
INTRAVENOUS | Status: AC
Start: 2023-07-27 — End: ?
  Filled 2023-07-27: qty 20

## 2023-07-27 MED ORDER — SUCCINYLCHOLINE CHLORIDE 200 MG/10ML IV SOSY
PREFILLED_SYRINGE | INTRAVENOUS | Status: DC | PRN
  Administered 2023-07-27: 30 mg via INTRAVENOUS

## 2023-07-27 MED ORDER — LEVETIRACETAM 500 MG PO TABS
500.0000 mg | ORAL_TABLET | Freq: Two times a day (BID) | ORAL | Status: DC
  Administered 2023-07-27 – 2023-07-28 (×2): 500 mg via ORAL
  Filled 2023-07-27 (×2): qty 1

## 2023-07-27 MED ORDER — PIPERACILLIN-TAZOBACTAM 3.375 G IVPB 30 MIN
3.3750 g | INTRAVENOUS | Status: AC
Start: 2023-07-27 — End: 2023-07-27
  Administered 2023-07-27: 3.375 g via INTRAVENOUS

## 2023-07-27 MED ORDER — PANTOPRAZOLE SODIUM 40 MG PO TBEC
40.0000 mg | DELAYED_RELEASE_TABLET | Freq: Every day | ORAL | Status: DC
  Administered 2023-07-27 – 2023-07-28 (×2): 40 mg via ORAL
  Filled 2023-07-27 (×2): qty 1

## 2023-07-27 MED ORDER — LIDOCAINE HCL (PF) 2 % IJ SOLN
INTRAMUSCULAR | Status: AC
Start: 2023-07-27 — End: ?
  Filled 2023-07-27: qty 5

## 2023-07-27 MED ORDER — PIPERACILLIN-TAZOBACTAM 3.375 G IVPB
INTRAVENOUS | Status: AC
  Filled 2023-07-27: qty 50

## 2023-07-27 MED ORDER — HYDROMORPHONE HCL 1 MG/ML IJ SOLN
1.0000 mg | INTRAMUSCULAR | Status: DC | PRN
  Administered 2023-07-27 – 2023-07-28 (×3): 1 mg via INTRAVENOUS
  Filled 2023-07-27 (×3): qty 1

## 2023-07-27 MED ORDER — ONDANSETRON HCL 4 MG/2ML IJ SOLN
4.0000 mg | Freq: Four times a day (QID) | INTRAMUSCULAR | Status: DC | PRN
  Administered 2023-07-28: 4 mg via INTRAVENOUS
  Filled 2023-07-27: qty 2

## 2023-07-27 MED ORDER — SODIUM CHLORIDE 0.9 % IV SOLN: INTRAVENOUS | Status: DC

## 2023-07-27 MED ORDER — STERILE WATER FOR IRRIGATION IR SOLN
Status: DC | PRN
  Administered 2023-07-27: 3000 mL

## 2023-07-27 MED ORDER — ACETAMINOPHEN 325 MG PO TABS: 650.0000 mg | ORAL_TABLET | Freq: Four times a day (QID) | ORAL | Status: DC | PRN

## 2023-07-27 MED ORDER — SODIUM CHLORIDE 0.9 % IV SOLN: INTRAVENOUS | Status: AC

## 2023-07-27 MED ORDER — FENTANYL CITRATE (PF) 100 MCG/2ML IJ SOLN
INTRAMUSCULAR | Status: DC | PRN
  Administered 2023-07-27: 50 ug via INTRAVENOUS

## 2023-07-27 MED ORDER — NALOXONE HCL 0.4 MG/ML IJ SOLN: 0.4000 mg | INTRAMUSCULAR | Status: DC | PRN

## 2023-07-27 MED ORDER — AMLODIPINE BESYLATE 10 MG PO TABS
10.0000 mg | ORAL_TABLET | Freq: Every day | ORAL | Status: DC
  Administered 2023-07-27 – 2023-07-28 (×2): 10 mg via ORAL
  Filled 2023-07-27 (×2): qty 1

## 2023-07-27 MED ORDER — DEXAMETHASONE SODIUM PHOSPHATE 4 MG/ML IJ SOLN
INTRAMUSCULAR | Status: DC | PRN
  Administered 2023-07-27: 8 mg via INTRAVENOUS

## 2023-07-27 MED ORDER — LACTATED RINGERS IV SOLN
INTRAVENOUS | Status: DC
Start: 2023-07-27 — End: 2023-07-27

## 2023-07-27 MED ORDER — PHENYLEPHRINE 80 MCG/ML (10ML) SYRINGE FOR IV PUSH (FOR BLOOD PRESSURE SUPPORT)
PREFILLED_SYRINGE | INTRAVENOUS | Status: AC
  Filled 2023-07-27: qty 10

## 2023-07-27 MED ORDER — ROSUVASTATIN CALCIUM 10 MG PO TABS
10.0000 mg | ORAL_TABLET | Freq: Every day | ORAL | Status: DC
  Administered 2023-07-27 – 2023-07-28 (×2): 10 mg via ORAL
  Filled 2023-07-27 (×2): qty 1

## 2023-07-27 MED ORDER — ACETAMINOPHEN 650 MG RE SUPP: 650.0000 mg | Freq: Four times a day (QID) | RECTAL | Status: DC | PRN

## 2023-07-27 MED ORDER — ORAL CARE MOUTH RINSE: 15.0000 mL | Freq: Once | OROMUCOSAL | Status: AC

## 2023-07-27 SURGICAL SUPPLY — 23 items
BAG URO CATCHER STRL LF (MISCELLANEOUS) ×1 IMPLANT
BASKET ZERO TIP NITINOL 2.4FR (BASKET) IMPLANT
CATH URETERAL DUAL LUMEN 10F (MISCELLANEOUS) IMPLANT
CATH URETL OPEN END 6FR 70 (CATHETERS) IMPLANT
CLOTH BEACON ORANGE TIMEOUT ST (SAFETY) ×1 IMPLANT
FIBER LASER MOSES 200 DFL (Laser) IMPLANT
FIBER LASER MOSES 365 DFL (Laser) IMPLANT
GLOVE BIO SURGEON STRL SZ8.5 (GLOVE) ×1 IMPLANT
GOWN STRL REUS W/ TWL XL LVL3 (GOWN DISPOSABLE) ×1 IMPLANT
GUIDEWIRE AMPLATZ STIFF 0.35 (WIRE) IMPLANT
GUIDEWIRE STR DUAL SENSOR (WIRE) ×1 IMPLANT
GUIDEWIRE SUPER STIFF (WIRE) IMPLANT
GUIDEWIRE ZIPWRE .038 STRAIGHT (WIRE) IMPLANT
KIT TURNOVER KIT A (KITS) IMPLANT
MANIFOLD NEPTUNE II (INSTRUMENTS) ×1 IMPLANT
PACK CYSTO (CUSTOM PROCEDURE TRAY) ×1 IMPLANT
SHEATH NAVIGATOR HD 11/13X28 (SHEATH) IMPLANT
SHEATH NAVIGATOR HD 11/13X36 (SHEATH) IMPLANT
STENT URET 6FRX22 CONTOUR (STENTS) IMPLANT
STENT URET 6FRX24 CONTOUR (STENTS) IMPLANT
TRAY FOLEY MTR SLVR 16FR STAT (SET/KITS/TRAYS/PACK) IMPLANT
TUBING CONNECTING 10 (TUBING) ×1 IMPLANT
TUBING UROLOGY SET (TUBING) ×1 IMPLANT

## 2023-07-27 NOTE — Plan of Care (Signed)

## 2023-07-27 NOTE — ED Notes (Signed)
 Patient went to short stay

## 2023-07-27 NOTE — Anesthesia Postprocedure Evaluation (Signed)
 Anesthesia Post Note  Patient: Teresa Lozano  Procedure(s) Performed: CYSTOSCOPY/RETROGRADE/ LEFT STENT PLACEMENT (Left)     Patient location during evaluation: PACU Anesthesia Type: General Level of consciousness: awake Pain management: pain level controlled Vital Signs Assessment: post-procedure vital signs reviewed and stable Respiratory status: spontaneous breathing, nonlabored ventilation and respiratory function stable Cardiovascular status: blood pressure returned to baseline and stable Postop Assessment: no apparent nausea or vomiting Anesthetic complications: no   No notable events documented.  Last Vitals:  Vitals:   07/27/23 0845 07/27/23 0900  BP: 121/71 131/74  Pulse: (!) 108 (!) 105  Resp: (!) 9 (!) 22  Temp:  36.9 C  SpO2: 98% 100%    Last Pain:  Vitals:   07/27/23 0900  TempSrc:   PainSc: 0-No pain                 Delon Aisha Arch

## 2023-07-27 NOTE — Anesthesia Procedure Notes (Signed)
 Procedure Name: Intubation Date/Time: 07/27/2023 7:47 AM  Performed by: Joshua Vernell BROCKS, CRNAPre-anesthesia Checklist: Patient identified, Emergency Drugs available, Suction available and Patient being monitored Patient Re-evaluated:Patient Re-evaluated prior to induction Oxygen Delivery Method: Circle system utilized Preoxygenation: Pre-oxygenation with 100% oxygen Induction Type: IV induction, Rapid sequence and Cricoid Pressure applied Laryngoscope Size: Mac and 3 Grade View: Grade I Tube type: Oral Tube size: 7.0 mm Number of attempts: 1 Airway Equipment and Method: Stylet Placement Confirmation: ETT inserted through vocal cords under direct vision, positive ETCO2 and breath sounds checked- equal and bilateral Secured at: 19 cm Tube secured with: Tape Dental Injury: Teeth and Oropharynx as per pre-operative assessment

## 2023-07-27 NOTE — Anesthesia Preprocedure Evaluation (Addendum)
 Anesthesia Evaluation  Patient identified by MRN, date of birth, ID band Patient awake    Reviewed: Allergy & Precautions, NPO status , Patient's Chart, lab work & pertinent test results  History of Anesthesia Complications Negative for: history of anesthetic complications  Airway Mallampati: III  TM Distance: >3 FB Neck ROM: Full   Comment: Previous grade I view with MAC 3 Dental  (+) Dental Advisory Given   Pulmonary neg pulmonary ROS   Pulmonary exam normal breath sounds clear to auscultation       Cardiovascular hypertension, Pt. on medications (-) angina (-) Past MI, (-) Cardiac Stents and (-) CABG (-) dysrhythmias  Rhythm:Regular Rate:Normal  HLD   Neuro/Psych  Headaches S/p cervical fusion  Patient denies seizures. She states Keppra  is for migraines.  Neuromuscular disease (lumbar radiculopathy)    GI/Hepatic Neg liver ROS, PUD,GERD  Medicated,,Adult hypertrophic pyloric stenosis   Endo/Other  negative endocrine ROS    Renal/GU CRFRenal disease     Musculoskeletal  (+) Arthritis ,  Osteoporosis    Abdominal   Peds  Hematology  (+) Blood dyscrasia, anemia Lab Results      Component                Value               Date                      WBC                      16.7 (H)            07/27/2023                HGB                      10.9 (L)            07/27/2023                HCT                      35.0 (L)            07/27/2023                MCV                      97.2                07/27/2023                PLT                      256                 07/27/2023              Anesthesia Other Findings   Reproductive/Obstetrics                             Anesthesia Physical Anesthesia Plan  ASA: 3  Anesthesia Plan: General   Post-op Pain Management: Tylenol  PO (pre-op)*   Induction: Intravenous and Rapid sequence  PONV Risk Score and Plan: 3 and Ondansetron ,  Dexamethasone  and Treatment may vary due to age or medical condition  Airway Management Planned: Oral ETT  Additional Equipment:  Intra-op Plan:   Post-operative Plan: Extubation in OR  Informed Consent: I have reviewed the patients History and Physical, chart, labs and discussed the procedure including the risks, benefits and alternatives for the proposed anesthesia with the patient or authorized representative who has indicated his/her understanding and acceptance.     Dental advisory given  Plan Discussed with: Anesthesiologist and CRNA  Anesthesia Plan Comments: (Risks of general anesthesia discussed including, but not limited to, sore throat, hoarse voice, chipped/damaged teeth, injury to vocal cords, nausea and vomiting, allergic reactions, lung infection, heart attack, stroke, and death. All questions answered. )       Anesthesia Quick Evaluation

## 2023-07-27 NOTE — ED Notes (Signed)
 No stone passed during shift ending at 0700

## 2023-07-27 NOTE — Op Note (Signed)
 OPERATIVE NOTE   Patient Name: Teresa Lozano  MRN: [id number]   Date of Procedure: 07/26/2022    Preoperative diagnosis:  Left distal ureteral stone//UTI  Postoperative diagnosis:  same  Procedure:  Cystoscopy, left retrograde ureteropyelogram with real-time interpretation of images on fluoroscopy, placement of left 6 x 24 cm double-J stent  Attending: Lorrene Hurst, MD  Anesthesia: General  Estimated blood loss: Minimal  Fluids: Per anesthesia record  Drains: 16 French Foley catheter and 6 x 24 cm double-J stent with tether removed  Specimens: None  Antibiotics: Rocephin  and Zosyn   Findings: Cystitis, left distal ureteral stone with proximal hydro-  Indications:  Left distal ureteral stone with infection  Description of Procedure:  Patient was brought to the operating room where she was correctly identified and subsequently underwent general anesthesia in the supine position on the operating room table.  Patient was then repositioned in the dorsolithotomy position and prepped and draped in the usual sterile fashion.  Preprocedural timeout was performed.  Cystoscopy was subsequently performed with a 22 French panendoscope.  On examination of the bladder the ureteral openings were in normal position however the left ureteral opening appeared somewhat pinpoint with perhaps some scarring.  There was also evidence of bladder inflammation consistent with her UTI.  A gentle left retrograde ureteropyelogram was subsequently performed to outline the ureter and collecting system.  There was noted to be a subtle filling defect within the distal ureter which seems somewhat narrowed with proximal dilation.  A left 6 x 24 cm double-J stent was subsequently placed with good position obtained under endoscopic and fluoroscopic control.  There was a significant hydronephrotic drainage with stent placement.  The urine was quite cloudy but not overly purulent.  A 16 French Foley catheter was  then placed with clear drainage and the procedure was terminated.  The patient tolerated the procedure well.  There were no apparent complications.  Complications: None  Condition: Stable, extubated, transferred to PACU  Plan: leave Foley until no fever for 24 hrs

## 2023-07-27 NOTE — Transfer of Care (Signed)
 Immediate Anesthesia Transfer of Care Note  Patient: Teresa Lozano  Procedure(s) Performed: Procedure(s): CYSTOSCOPY/RETROGRADE/ LEFT STENT PLACEMENT (Left)  Patient Location: PACU  Anesthesia Type:General  Level of Consciousness: Patient easily awoken, comfortable, cooperative, following commands, responds to stimulation.   Airway & Oxygen Therapy: Patient spontaneously breathing, ventilating well, oxygen via simple oxygen mask.  Post-op Assessment: Report given to PACU RN, vital signs reviewed and stable, moving all extremities.   Post vital signs: Reviewed and stable.  Complications: No apparent anesthesia complications  Last Vitals:  Vitals Value Taken Time  BP 125/66 07/27/23 0827  Temp    Pulse 114 07/27/23 0829  Resp 20 07/27/23 0829  SpO2 100 % 07/27/23 0829  Vitals shown include unfiled device data.  Last Pain:  Vitals:   07/27/23 0719  TempSrc: Oral  PainSc: 7       Patients Stated Pain Goal: 3 (07/27/23 0719)  Complications: No notable events documented.

## 2023-07-27 NOTE — Progress Notes (Signed)
 PROGRESS NOTE    Teresa Lozano  FMW:[id number] DOB: 11-Sep-1950 DOA: 07/26/2023 PCP: Tisovec, Richard W, MD   Brief Narrative:  This 73 y.o. female with medical history significant of nephrolithiasis, hypertension, hyperlipidemia, GEJ stricture status post dilation, GERD, seizure disorder, migraine headaches presented to ED with complaints of left flank pain, dysuria, nausea, and vomiting.  In the ED UA +,CT renal stone study showing mild left hydronephrosis due to a punctate 1 mm stone at the left UVJ.  EDP discussed the case with urology who recommended IV antibiotics,  NPO.  Patient was admitted for further evaluation,  Patient is status post ureteral stent placement in the morning. Patient continued on IV antibiotics.  Assessment & Plan:   Principal Problem:   Complicated urinary tract infection Active Problems:   Essential hypertension   HLD (hyperlipidemia)   Sepsis (HCC)   Seizure disorder (HCC)  Sepsis secondary to complicated UTI Infected kidney stone -Patient presented with  severe left flank pain, dysuria, nausea, and vomiting x 3 days.  -UA with positive nitrite, moderate leukocytes, and microscopy showing 11-20 RBCs, >50 WBCs, and many bacteria. -CT renal stone study showing mild left hydronephrosis due to 1 mm stone at the left UVJ.   -Meets criteria for sepsis with tachycardia and leukocytosis.No lactic acidosis or hypotension to suggest severe sepsis. -Urology consulted and recommended continuing IV antibiotics and straining the patient's urine to look for the stone.  If she does not pass the stone, then may need a ureteral stent.   -Status post ureteral stent tolerated well. -Continue ceftriaxone  -Continue IV fluid resuscitation. -Continue adequate pain control. -Antiemetic as needed -Trend WBC count and lactate -Monitor renal function -Follow-up urine and blood cultures   Seizure disorder:  Continue current dose of home Keppra  in IV form.   Hypertension Avoid  antihypertensives at this time given concern for sepsis.   Hyperlipidemia Resume statin once able to take oral.   DVT prophylaxis: SCDs Code Status: Full code Family Communication: No family at side Disposition Plan:    Status is: Inpatient Remains inpatient appropriate because: Admitted for sepsis secondary to infected ureteral stone.  Status post ureteral stent placement tolerated well     Consultants:  Urology  Procedures:   Antimicrobials:  Anti-infectives (From admission, onward)    Start     Dose/Rate Route Frequency Ordered Stop   07/27/23 2300  cefTRIAXone  (ROCEPHIN ) 2 g in sodium chloride  0.9 % 100 mL IVPB        2 g 200 mL/hr over 30 Minutes Intravenous Every 24 hours 07/27/23 0030     07/27/23 0657  piperacillin -tazobactam (ZOSYN ) IVPB 3.375 g        3.375 g 100 mL/hr over 30 Minutes Intravenous 30 min pre-op 07/27/23 0657 07/27/23 0751   07/27/23 0653  piperacillin -tazobactam (ZOSYN ) 3.375 GM/50ML IVPB       Note to Pharmacy: Kateri Raisin K: cabinet override      07/27/23 0653 07/27/23 0757   07/26/23 2315  cefTRIAXone  (ROCEPHIN ) 2 g in sodium chloride  0.9 % 100 mL IVPB        2 g 200 mL/hr over 30 Minutes Intravenous  Once 07/26/23 2300 07/27/23 0001   07/26/23 2300  cefTRIAXone  (ROCEPHIN ) 1 g in sodium chloride  0.9 % 100 mL IVPB  Status:  Discontinued        1 g 200 mL/hr over 30 Minutes Intravenous  Once 07/26/23 2258 07/26/23 2300      Subjective: Patient was seen and examined at bedside.  Overnight events noted.   Patient reports doing better.  Pain is improved.  Feeling nauseous but otherwise improving.  Objective: Vitals:   07/27/23 1315 07/27/23 1330 07/27/23 1345 07/27/23 1400  BP: 115/64 125/70 117/60 (!) 123/59  Pulse: 86 83 79 80  Resp: (!) 21 19 20 19   Temp:      TempSrc:      SpO2: 95% 96% 98% 99%  Weight:      Height:        Intake/Output Summary (Last 24 hours) at 07/27/2023 1449 Last data filed at 07/27/2023 0824 Gross per 24  hour  Intake 1649 ml  Output 5 ml  Net 1644 ml   Filed Weights   07/26/23 1924 07/27/23 0719  Weight: 45 kg 45 kg    Examination:  General exam: Appears calm and comfortable, not in any acute distress. Respiratory system: Clear to auscultation. Respiratory effort normal.  RR 15 Cardiovascular system: S1 & S2 heard, RRR. No JVD, murmurs, rubs, gallops or clicks.  Gastrointestinal system: Abdomen is non distended, soft and non tender.  Normal bowel sounds heard. Central nervous system: Alert and oriented x 3. No focal neurological deficits. Extremities: No edema, no cyanosis, no clubbing Skin: No rashes, lesions or ulcers Psychiatry: Judgement and insight appear normal. Mood & affect appropriate.     Data Reviewed: I have personally reviewed following labs and imaging studies  CBC: Recent Labs  Lab 07/26/23 2021 07/26/23 2039 07/27/23 0500  WBC 18.9*  --  16.7*  NEUTROABS 17.0*  --   --   HGB 13.3 13.9 10.9*  HCT 42.6 41.0 35.0*  MCV 96.2  --  97.2  PLT 301  --  256   Basic Metabolic Panel: Recent Labs  Lab 07/26/23 2021 07/26/23 2039 07/27/23 0500  NA 135 137 137  K 3.5 3.6 3.6  CL 98 100 104  CO2 23  --  22  GLUCOSE 135* 125* 118*  BUN 25* 25* 19  CREATININE 1.11* 1.30* 0.77  CALCIUM  8.8*  --  7.9*   GFR: Estimated Creatinine Clearance: 45.8 mL/min (by C-G formula based on SCr of 0.77 mg/dL). Liver Function Tests: Recent Labs  Lab 07/26/23 2021  AST 15  ALT 12  ALKPHOS 81  BILITOT 0.8  PROT 7.3  ALBUMIN 3.1*   No results for input(s): LIPASE, AMYLASE in the last 168 hours. No results for input(s): AMMONIA in the last 168 hours. Coagulation Profile: No results for input(s): INR, PROTIME in the last 168 hours. Cardiac Enzymes: No results for input(s): CKTOTAL, CKMB, CKMBINDEX, TROPONINI in the last 168 hours. BNP (last 3 results) No results for input(s): PROBNP in the last 8760 hours. HbA1C: No results for input(s): HGBA1C  in the last 72 hours. CBG: No results for input(s): GLUCAP in the last 168 hours. Lipid Profile: No results for input(s): CHOL, HDL, LDLCALC, TRIG, CHOLHDL, LDLDIRECT in the last 72 hours. Thyroid  Function Tests: No results for input(s): TSH, T4TOTAL, FREET4, T3FREE, THYROIDAB in the last 72 hours. Anemia Panel: No results for input(s): VITAMINB12, FOLATE, FERRITIN, TIBC, IRON , RETICCTPCT in the last 72 hours. Sepsis Labs: Recent Labs  Lab 07/26/23 2040  LATICACIDVEN 1.6    Recent Results (from the past 240 hours)  Culture, blood (routine x 2)     Status: None (Preliminary result)   Collection Time: 07/26/23  8:22 PM   Specimen: BLOOD  Result Value Ref Range Status   Specimen Description   Final    BLOOD LEFT ANTECUBITAL Performed  at [facility], [address] [place], [place] [address]    Special Requests   Final    BOTTLES DRAWN AEROBIC AND ANAEROBIC Blood Culture results may not be optimal due to an inadequate volume of blood received in culture bottles Performed at [facility], [address], [place], [place] [address]    Culture   Final    NO GROWTH < 12 HOURS Performed at [facility], [address], [place], [place] [address]    Report Status PENDING  Incomplete         Radiology Studies: DG C-Arm 1-60 Min-No Report Result Date: 07/27/2023 Fluoroscopy was utilized by the requesting physician.  No radiographic interpretation.   CT Renal Stone Study Result Date: 07/26/2023 CLINICAL DATA:  Left flank pain EXAM: CT ABDOMEN AND PELVIS WITHOUT CONTRAST TECHNIQUE: Multidetector CT imaging of the abdomen and pelvis was performed following the standard protocol without IV contrast. RADIATION DOSE REDUCTION: This exam was performed according to the departmental dose-optimization program which includes automated exposure control, adjustment of the mA and/or kV according to patient size and/or  use of iterative reconstruction technique. COMPARISON:  07/24/2021 FINDINGS: Lower chest: No acute abnormality Hepatobiliary: Gallbladder distended. No visible stones or biliary ductal dilatation. No focal hepatic abnormality. Pancreas: No focal abnormality or ductal dilatation. Spleen: No focal abnormality.  Normal size. Adrenals/Urinary Tract: Bilateral renal cysts appear benign. No follow-up imaging recommended. Mild left hydronephrosis. Punctate 1 mm stone at the left UVJ. No stones or hydronephrosis on the right. Adrenal glands and urinary bladder unremarkable. Stomach/Bowel: Large stool burden throughout the colon. Stomach, large and small bowel grossly unremarkable. Vascular/Lymphatic: Aortic atherosclerosis. No evidence of aneurysm or adenopathy. Reproductive: Uterus and adnexa unremarkable.  No mass. Other: No free fluid or free air. Musculoskeletal: No acute bony abnormality. IMPRESSION: Mild left hydronephrosis due to 2 1 mm left UVJ stone. Gallbladder distension. Aortic atherosclerosis. Large stool burden throughout the colon. Electronically Signed   By: Franky Crease M.D.   On: 07/26/2023 21:06   Scheduled Meds: Continuous Infusions:  cefTRIAXone  (ROCEPHIN )  IV     levETIRAcetam  500 mg (07/27/23 0131)     LOS: 1 day    Time spent: 50 mins    Darcel Dawley, MD [facility]   If 7PM-7AM, please contact night-coverage

## 2023-07-28 ENCOUNTER — Encounter (HOSPITAL_COMMUNITY): Payer: Self-pay | Admitting: Urology

## 2023-07-28 DIAGNOSIS — N390 Urinary tract infection, site not specified: Secondary | ICD-10-CM | POA: Diagnosis not present

## 2023-07-28 LAB — CBC
HCT: 36.9 % (ref 36.0–46.0)
Hemoglobin: 11.6 g/dL — ABNORMAL LOW (ref 12.0–15.0)
MCH: 30.4 pg (ref 26.0–34.0)
MCHC: 31.4 g/dL (ref 30.0–36.0)
MCV: 96.6 fL (ref 80.0–100.0)
Platelets: 267 10*3/uL (ref 150–400)
RBC: 3.82 MIL/uL — ABNORMAL LOW (ref 3.87–5.11)
RDW: 13.7 % (ref 11.5–15.5)
WBC: 10.1 10*3/uL (ref 4.0–10.5)
nRBC: 0 % (ref 0.0–0.2)

## 2023-07-28 LAB — BASIC METABOLIC PANEL
Anion gap: 9 (ref 5–15)
BUN: 16 mg/dL (ref 8–23)
CO2: 27 mmol/L (ref 22–32)
Calcium: 8.3 mg/dL — ABNORMAL LOW (ref 8.9–10.3)
Chloride: 102 mmol/L (ref 98–111)
Creatinine, Ser: 0.59 mg/dL (ref 0.44–1.00)
GFR, Estimated: >60 mL/min (ref >60)
Glucose, Bld: 116 mg/dL — ABNORMAL HIGH (ref 70–99)
Potassium: 3.5 mmol/L (ref 3.5–5.1)
Sodium: 138 mmol/L (ref 135–145)

## 2023-07-28 MED ORDER — CHLORHEXIDINE GLUCONATE CLOTH 2 % EX PADS
6.0000 | MEDICATED_PAD | Freq: Every day | CUTANEOUS | Status: DC
  Administered 2023-07-28: 6 via TOPICAL

## 2023-07-28 MED ORDER — CEFDINIR 300 MG PO CAPS: 300.0000 mg | ORAL_CAPSULE | Freq: Two times a day (BID) | ORAL | 0 refills | Status: DC

## 2023-07-28 MED ORDER — OXYCODONE HCL 5 MG PO TABS: 5.0000 mg | ORAL_TABLET | Freq: Three times a day (TID) | ORAL | 0 refills | Status: AC | PRN

## 2023-07-28 MED ORDER — CEFADROXIL 500 MG PO CAPS: 1000.0000 mg | ORAL_CAPSULE | Freq: Two times a day (BID) | ORAL | 0 refills | Status: AC

## 2023-07-28 NOTE — Discharge Summary (Signed)
 Physician Discharge Summary  SAFIYYA STOKES FMW:[id number] DOB: 07/08/1951 DOA: 07/26/2023  PCP: Tisovec, Richard W, MD  Admit date: 07/26/2023  Discharge date: 07/28/2023  Admitted From: Home  Disposition:  Home  Recommendations for Outpatient Follow-up:  Follow up with PCP in 1-2 weeks. Please obtain BMP/CBC in one week. Advised to follow-up with urology Dr. Shona in 2 to 4 weeks for definitive stone treatment. Advised to take Omnicef  300 mg twice daily for 10 days for UTI. Advised to take oxycodone  as needed for severe pain.  Home Health:None Equipment/Devices:None  Discharge Condition: Stable CODE STATUS:Full code Diet recommendation: Heart Healthy  Brief [facility] Course: This 73 y.o. female with medical history significant of nephrolithiasis, hypertension, hyperlipidemia, GEJ stricture status post dilation, GERD, seizure disorder, migraine headaches presented to ED with complaints of left flank pain, dysuria, nausea, and vomiting.  In the ED UA +,CT renal stone study showing mild left hydronephrosis due to a punctate 1 mm stone at the left UVJ.  EDP discussed the case with urology who recommended IV antibiotics,  NPO.  Patient was admitted for further evaluation,  Patient is status post ureteral stent placement in the morning. Patient continued on IV antibiotics.  Patient reports feeling much better,  pain has improved.  Renal functions improved,  leukocytosis resolved.  Urine cultures growing E. coli sensitivity pending.  Patient has prior UTIs with E. coli the main causative agent.  Urology signed off,  recommended patient can be discharged on Omnicef  twice a day for 10 days.  Patient is being discharged home and will follow-up with urology in 2 to 4 weeks for definitive stone treatment.   Discharge Diagnoses:  Principal Problem:   Complicated urinary tract infection Active Problems:   Essential hypertension   HLD (hyperlipidemia)   Sepsis (HCC)   Seizure disorder  (HCC)  Sepsis secondary to complicated UTI Infected kidney stone -Patient presented with  severe left flank pain, dysuria, nausea, and vomiting x 3 days.  -UA with positive nitrite, moderate leukocytes, and microscopy showing 11-20 RBCs, >50 WBCs, and many bacteria.  -CT renal stone study showing mild left hydronephrosis due to 1 mm stone at the left UVJ.   -Meets criteria for sepsis with tachycardia and leukocytosis. No lactic acidosis or hypotension to suggest severe sepsis.  -Urology consulted and recommended continuing IV antibiotics and straining the patient's urine to look for the stone.   If she does not pass the stone, then may need a ureteral stent.   -Status post ureteral stent,  tolerated well. -Continued on  ceftriaxone  -Continue IV fluid resuscitation. -Continue adequate pain control. -Urology signed off,  recommended patient can be discharged on Omnicef .   Seizure disorder:  Continue current dose of home Keppra  in IV form.   Hypertension Avoid antihypertensives at this time given concern for sepsis.   Hyperlipidemia Resume statin once able to take oral.  Discharge Instructions  Discharge Instructions     Call MD for:  difficulty breathing, headache or visual disturbances   Complete by: As directed    Call MD for:  persistant dizziness or light-headedness   Complete by: As directed    Call MD for:  persistant nausea and vomiting   Complete by: As directed    Diet - low sodium heart healthy   Complete by: As directed    Diet Carb Modified   Complete by: As directed    Discharge instructions   Complete by: As directed    Advised to follow-up with primary care  physician in 1 week. Advised to follow-up with urology Dr. Shona in 2 to 4 weeks for definitive stone treatment. Advised to take Omnicef  300 mg twice daily for 10 days for UTI. Advised to take oxycodone  as needed for severe pain.   Increase activity slowly   Complete by: As directed       Allergies as  of 07/28/2023       Reactions   Lisinopril  Other (See Comments), Cough   Coughing all  day/night per patient   Robaxin  [methocarbamol ] Nausea And Vomiting        Medication List     STOP taking these medications    metoCLOPramide  5 MG tablet Commonly known as: Reglan    ondansetron  4 MG tablet Commonly known as: ZOFRAN    predniSONE  10 MG (21) Tbpk tablet Commonly known as: STERAPRED UNI-PAK 21 TAB       TAKE these medications    amLODipine  10 MG tablet Commonly known as: NORVASC  Take 10 mg by mouth daily.   butalbital -acetaminophen -caffeine  50-325-40 MG tablet Commonly known as: FIORICET One to two tablets by mouth as needed for headache. No more than 4/week What changed:  how much to take how to take this when to take this reasons to take this   cefdinir  300 MG capsule Commonly known as: OMNICEF  Take 1 capsule (300 mg total) by mouth 2 (two) times daily for 10 days.   cetirizine 10 MG tablet Commonly known as: ZYRTEC Take 10 mg by mouth daily.   CRANBERRY PO Take 650 mg by mouth daily.   cyclobenzaprine  5 MG tablet Commonly known as: FLEXERIL  Take 1 tablet (5 mg total) by mouth 2 (two) times daily as needed for muscle spasms. What changed:  how much to take when to take this   ferrous sulfate  325 (65 FE) MG tablet TAKE 1 TABLET BY MOUTH EVERY DAY WITH BREAKFAST What changed: See the new instructions.   imipramine  25 MG tablet Commonly known as: TOFRANIL  Take 3 tablets (75 mg total) by mouth at bedtime.   levETIRAcetam  500 MG tablet Commonly known as: KEPPRA  Take 1 tablet (500 mg total) by mouth 2 (two) times daily.   Magnesium  500 MG Caps Take 500 mg by mouth in the morning.   MULTI-VITAMIN DAILY PO Take 1 tablet by mouth daily with breakfast.   omeprazole  40 MG capsule Commonly known as: PRILOSEC TAKE 1 CAPSULE BY MOUTH TWICE A DAY   oxyCODONE  5 MG immediate release tablet Commonly known as: Roxicodone  Take 1 tablet (5 mg total) by  mouth every 8 (eight) hours as needed for up to 3 days.   polyethylene glycol 17 g packet Commonly known as: MIRALAX  / GLYCOLAX  Take 17 g by mouth daily.   Reclast  5 MG/100ML Soln injection Generic drug: zoledronic  acid Inject 5 mg into the vein once.   rosuvastatin  10 MG tablet Commonly known as: CRESTOR  Take 10 mg by mouth daily.   sucralfate  1 g tablet Commonly known as: CARAFATE  Take 1 tablet (1 g total) by mouth as needed. What changed:  when to take this reasons to take this   SUMAtriptan  100 MG tablet Commonly known as: IMITREX  TAKE 1 TAB AS NEEDED FOR MIGRAINE. MAY REPEAT IN 2 HRS IF HEADACHE PERSISTS OR RECURS. What changed:  how much to take how to take this when to take this additional instructions   Vitamin D3 125 MCG (5000 UT) Caps Take 5,000 Units by mouth daily.        Follow-up Information  Tisovec, Charlie ORN, MD Follow up in 1 week(s).   Specialty: Internal Medicine Contact information: [address] [place] [address] [phone]         Shona Layman BROCKS, MD Follow up in 1 week(s).   Specialty: Urology Contact information: [address] [place] [address] [phone]                Allergies  Allergen Reactions   Lisinopril  Other (See Comments) and Cough    Coughing all  day/night per patient   Robaxin  [Methocarbamol ] Nausea And Vomiting    Consultations: Urology   Procedures/Studies: DG C-Arm 1-60 Min-No Report Result Date: 07/27/2023 Fluoroscopy was utilized by the requesting physician.  No radiographic interpretation.   CT Renal Stone Study Result Date: 07/26/2023 CLINICAL DATA:  Left flank pain EXAM: CT ABDOMEN AND PELVIS WITHOUT CONTRAST TECHNIQUE: Multidetector CT imaging of the abdomen and pelvis was performed following the standard protocol without IV contrast. RADIATION DOSE REDUCTION: This exam was performed according to the departmental dose-optimization program which includes  automated exposure control, adjustment of the mA and/or kV according to patient size and/or use of iterative reconstruction technique. COMPARISON:  07/24/2021 FINDINGS: Lower chest: No acute abnormality Hepatobiliary: Gallbladder distended. No visible stones or biliary ductal dilatation. No focal hepatic abnormality. Pancreas: No focal abnormality or ductal dilatation. Spleen: No focal abnormality.  Normal size. Adrenals/Urinary Tract: Bilateral renal cysts appear benign. No follow-up imaging recommended. Mild left hydronephrosis. Punctate 1 mm stone at the left UVJ. No stones or hydronephrosis on the right. Adrenal glands and urinary bladder unremarkable. Stomach/Bowel: Large stool burden throughout the colon. Stomach, large and small bowel grossly unremarkable. Vascular/Lymphatic: Aortic atherosclerosis. No evidence of aneurysm or adenopathy. Reproductive: Uterus and adnexa unremarkable.  No mass. Other: No free fluid or free air. Musculoskeletal: No acute bony abnormality. IMPRESSION: Mild left hydronephrosis due to 2 1 mm left UVJ stone. Gallbladder distension. Aortic atherosclerosis. Large stool burden throughout the colon. Electronically Signed   By: Franky Crease M.D.   On: 07/26/2023 21:06      Subjective: Seen and examined at bedside.  Overnight events noted.   Patient reporting much better,  wants to be discharged.  Pain is resolved.  Discharge Exam: Vitals:   07/28/23 0853 07/28/23 1409  BP: 130/71 133/77  Pulse: 95 (!) 103  Resp:  18  Temp: 98 F (36.7 C) 98.8 F (37.1 C)  SpO2: 100% 98%   Vitals:   07/28/23 0132 07/28/23 0457 07/28/23 0853 07/28/23 1409  BP: 130/63 117/67 130/71 133/77  Pulse: 97 96 95 (!) 103  Resp: 16 16  18   Temp: 98.9 F (37.2 C) 97.9 F (36.6 C) 98 F (36.7 C) 98.8 F (37.1 C)  TempSrc: Oral Oral Oral Oral  SpO2: 98% 97% 100% 98%  Weight:      Height:        General: Pt is alert, awake, not in acute distress Cardiovascular: RRR, S1/S2 +, no rubs,  no gallops Respiratory: CTA bilaterally, no wheezing, no rhonchi Abdominal: Soft, NT, ND, bowel sounds + Extremities: no edema, no cyanosis    The results of significant diagnostics from this hospitalization (including imaging, microbiology, ancillary and laboratory) are listed below for reference.     Microbiology: Recent Results (from the past 240 hours)  Culture, blood (routine x 2)     Status: None (Preliminary result)   Collection Time: 07/26/23  8:22 PM   Specimen: BLOOD  Result Value Ref Range Status  Specimen Description   Final    BLOOD LEFT ANTECUBITAL Performed at [facility], [address] [place], [place] [address]    Special Requests   Final    BOTTLES DRAWN AEROBIC AND ANAEROBIC Blood Culture results may not be optimal due to an inadequate volume of blood received in culture bottles Performed at [facility], [address] [place], [place] [address]    Culture   Final    NO GROWTH 2 DAYS Performed at [facility], [address], [place], [place] [address]    Report Status PENDING  Incomplete  Urine Culture     Status: Abnormal (Preliminary result)   Collection Time: 07/26/23 10:09 PM   Specimen: Urine, Random  Result Value Ref Range Status   Specimen Description   Final    URINE, RANDOM Performed at [facility], [address], [place], [place] [address]    Special Requests   Final    NONE Reflexed from [id number] Performed at [facility], [address] [place], [place] [address]    Culture >=100,000 COLONIES/mL ESCHERICHIA COLI (A)  Final   Report Status PENDING  Incomplete  Culture, blood (routine x 2)     Status: None (Preliminary result)   Collection Time: 07/27/23  8:35 AM   Specimen: BLOOD  Result Value Ref Range Status   Specimen Description   Final    BLOOD SITE NOT SPECIFIED Performed at [facility], [address] [place], [place]  [address]    Special Requests   Final    BOTTLES DRAWN AEROBIC AND ANAEROBIC Blood Culture results may not be optimal due to an inadequate volume of blood received in culture bottles Performed at [facility], [address], [place], [place] [address]    Culture   Final    NO GROWTH < 24 HOURS Performed at [facility], [address], [place], [place] [address]    Report Status PENDING  Incomplete     Labs: BNP (last 3 results) No results for input(s): BNP in the last 8760 hours. Basic Metabolic Panel: Recent Labs  Lab 07/26/23 2021 07/26/23 2039 07/27/23 0500 07/28/23 1045  NA 135 137 137 138  K 3.5 3.6 3.6 3.5  CL 98 100 104 102  CO2 23  --  22 27  GLUCOSE 135* 125* 118* 116*  BUN 25* 25* 19 16  CREATININE 1.11* 1.30* 0.77 0.59  CALCIUM  8.8*  --  7.9* 8.3*   Liver Function Tests: Recent Labs  Lab 07/26/23 2021  AST 15  ALT 12  ALKPHOS 81  BILITOT 0.8  PROT 7.3  ALBUMIN 3.1*   No results for input(s): LIPASE, AMYLASE in the last 168 hours. No results for input(s): AMMONIA in the last 168 hours. CBC: Recent Labs  Lab 07/26/23 2021 07/26/23 2039 07/27/23 0500 07/28/23 1045  WBC 18.9*  --  16.7* 10.1  NEUTROABS 17.0*  --   --   --   HGB 13.3 13.9 10.9* 11.6*  HCT 42.6 41.0 35.0* 36.9  MCV 96.2  --  97.2 96.6  PLT 301  --  256 267   Cardiac Enzymes: No results for input(s): CKTOTAL, CKMB, CKMBINDEX, TROPONINI in the last 168 hours. BNP: Invalid input(s): POCBNP CBG: No results for input(s): GLUCAP in the last 168 hours. D-Dimer No results for input(s): DDIMER in the last 72 hours. Hgb A1c No results for input(s): HGBA1C in the  last 72 hours. Lipid Profile No results for input(s): CHOL, HDL, LDLCALC, TRIG, CHOLHDL, LDLDIRECT in the last 72 hours. Thyroid  function studies No results for input(s): TSH, T4TOTAL, T3FREE, THYROIDAB in the last 72 hours.  Invalid input(s):  FREET3 Anemia work up No results for input(s): VITAMINB12, FOLATE, FERRITIN, TIBC, IRON , RETICCTPCT in the last 72 hours. Urinalysis    Component Value Date/Time   COLORURINE YELLOW 07/26/2023 2209   APPEARANCEUR CLOUDY (A) 07/26/2023 2209   LABSPEC 1.024 07/26/2023 2209   PHURINE 5.0 07/26/2023 2209   GLUCOSEU NEGATIVE 07/26/2023 2209   HGBUR MODERATE (A) 07/26/2023 2209   BILIRUBINUR NEGATIVE 07/26/2023 2209   BILIRUBINUR neg 12/24/2019 1558   KETONESUR 20 (A) 07/26/2023 2209   PROTEINUR 100 (A) 07/26/2023 2209   UROBILINOGEN 1.0 03/20/2020 1154   NITRITE POSITIVE (A) 07/26/2023 2209   LEUKOCYTESUR MODERATE (A) 07/26/2023 2209   Sepsis Labs Recent Labs  Lab 07/26/23 2021 07/27/23 0500 07/28/23 1045  WBC 18.9* 16.7* 10.1   Microbiology Recent Results (from the past 240 hours)  Culture, blood (routine x 2)     Status: None (Preliminary result)   Collection Time: 07/26/23  8:22 PM   Specimen: BLOOD  Result Value Ref Range Status   Specimen Description   Final    BLOOD LEFT ANTECUBITAL Performed at [facility], [address], [place], [place] [address]    Special Requests   Final    BOTTLES DRAWN AEROBIC AND ANAEROBIC Blood Culture results may not be optimal due to an inadequate volume of blood received in culture bottles Performed at [facility], [address] [place], [place] [address]    Culture   Final    NO GROWTH 2 DAYS Performed at [facility], [address], [place], [place] [address]    Report Status PENDING  Incomplete  Urine Culture     Status: Abnormal (Preliminary result)   Collection Time: 07/26/23 10:09 PM   Specimen: Urine, Random  Result Value Ref Range Status   Specimen Description   Final    URINE, RANDOM Performed at [facility], [address], [place], [place] [address]    Special Requests   Final    NONE Reflexed from [id number] Performed at [facility], [address] [place], [place] [address]    Culture >=100,000 COLONIES/mL ESCHERICHIA COLI (A)  Final   Report Status PENDING  Incomplete  Culture, blood (routine x 2)     Status: None (Preliminary result)   Collection Time: 07/27/23  8:35 AM   Specimen: BLOOD  Result Value Ref Range Status   Specimen Description   Final    BLOOD SITE NOT SPECIFIED Performed at [facility], [address] [place], [place] [address]    Special Requests   Final    BOTTLES DRAWN AEROBIC AND ANAEROBIC Blood Culture results may not be optimal due to an inadequate volume of blood received in culture bottles Performed at [facility], [address], [place], [place] [address]    Culture   Final    NO GROWTH < 24 HOURS Performed at [facility], [address], [place], [place] [address]    Report Status PENDING  Incomplete     Time coordinating discharge: Over 30 minutes  SIGNED:   Darcel Dawley, MD  [facility] 07/28/2023, 2:16 PM Pager   If 7PM-7AM, please contact night-coverage

## 2023-07-28 NOTE — Progress Notes (Signed)
 1 Day Post-Op Subjective: First time meeting patient this morning.  She is alert, oriented, no distress.  No acute events overnight postoperatively. Labs are back within normal range and patient has not had recurrence of fever. She feels much better today and is eager to discharge home.  Objective: Vital signs in last 24 hours: Temp:  [97.9 F (36.6 C)-98.9 F (37.2 C)] 98 F (36.7 C) (01/09 0853) Pulse Rate:  [79-106] 95 (01/09 0853) Resp:  [16-22] 16 (01/09 0457) BP: (108-139)/(59-76) 130/71 (01/09 0853) SpO2:  [95 %-100 %] 100 % (01/09 9146)  Assessment/Plan: # Ureteral stone S/p ureteral stent placement with Dr. Shona on 07/27/2023. Leukocytosis has resolved and renal function is back to baseline. No fever in overnight or signs of systemic infection. Okay to discharge home from urologic perspective.  Will need to follow-up with Dr. Shona in 2 to 4 weeks for definitive stone management after she completes her antibiotic regimen # UTI Urinalysis is consistent with infection and positive for GNR.  It appears that she has had mostly pansensitive E. Coli, looking back at previous culture data.  Since she has been so responsive on ceftriaxone , would recommend placing her on 10 days of Omnicef  on discharge.  Intake/Output from previous day: 01/08 0701 - 01/09 0700 In: 1600.4 [P.O.:440; I.V.:922; IV Piggyback:238.4] Out: 655 [Urine:650; Blood:5]  Intake/Output this shift: Total I/O In: 240 [P.O.:240] Out: 500 [Urine:500]  Physical Exam:  General: Alert and oriented CV: No cyanosis Lungs: equal chest rise Abdomen: Soft, NTND, no rebound or guarding Gu: Foley catheter with clear yellow urine.  Removed this morning.  Lab Results: Recent Labs    07/26/23 2039 07/27/23 0500 07/28/23 1045  HGB 13.9 10.9* 11.6*  HCT 41.0 35.0* 36.9   BMET Recent Labs    07/27/23 0500 07/28/23 1045  NA 137 138  K 3.6 3.5  CL 104 102  CO2 22 27  GLUCOSE 118* 116*  BUN 19 16  CREATININE  0.77 0.59  CALCIUM  7.9* 8.3*     Studies/Results: DG C-Arm 1-60 Min-No Report Result Date: 07/27/2023 Fluoroscopy was utilized by the requesting physician.  No radiographic interpretation.   CT Renal Stone Study Result Date: 07/26/2023 CLINICAL DATA:  Left flank pain EXAM: CT ABDOMEN AND PELVIS WITHOUT CONTRAST TECHNIQUE: Multidetector CT imaging of the abdomen and pelvis was performed following the standard protocol without IV contrast. RADIATION DOSE REDUCTION: This exam was performed according to the departmental dose-optimization program which includes automated exposure control, adjustment of the mA and/or kV according to patient size and/or use of iterative reconstruction technique. COMPARISON:  07/24/2021 FINDINGS: Lower chest: No acute abnormality Hepatobiliary: Gallbladder distended. No visible stones or biliary ductal dilatation. No focal hepatic abnormality. Pancreas: No focal abnormality or ductal dilatation. Spleen: No focal abnormality.  Normal size. Adrenals/Urinary Tract: Bilateral renal cysts appear benign. No follow-up imaging recommended. Mild left hydronephrosis. Punctate 1 mm stone at the left UVJ. No stones or hydronephrosis on the right. Adrenal glands and urinary bladder unremarkable. Stomach/Bowel: Large stool burden throughout the colon. Stomach, large and small bowel grossly unremarkable. Vascular/Lymphatic: Aortic atherosclerosis. No evidence of aneurysm or adenopathy. Reproductive: Uterus and adnexa unremarkable.  No mass. Other: No free fluid or free air. Musculoskeletal: No acute bony abnormality. IMPRESSION: Mild left hydronephrosis due to 2 1 mm left UVJ stone. Gallbladder distension. Aortic atherosclerosis. Large stool burden throughout the colon. Electronically Signed   By: Franky Crease M.D.   On: 07/26/2023 21:06      LOS: 2  days   Ole Bourdon, NP [facility] Pager: [phone]  07/28/2023, 12:03 PM

## 2023-07-28 NOTE — Plan of Care (Signed)

## 2023-07-28 NOTE — Progress Notes (Signed)
   07/28/23 1011  TOC Brief Assessment  Insurance and Status Reviewed  Patient has primary care physician Yes  Home environment has been reviewed home with family  Prior level of function: independent  Social Drivers of Health Review SDOH reviewed no interventions necessary  Readmission risk has been reviewed Yes  Transition of care needs no transition of care needs at this time

## 2023-07-29 LAB — URINE CULTURE: Culture: >=100000 — AB

## 2023-07-31 LAB — CULTURE, BLOOD (ROUTINE X 2): Culture: NO GROWTH

## 2023-08-01 LAB — CULTURE, BLOOD (ROUTINE X 2): Culture: NO GROWTH

## 2023-08-04 DIAGNOSIS — I10 Essential (primary) hypertension: Secondary | ICD-10-CM | POA: Diagnosis not present

## 2023-08-04 DIAGNOSIS — S92354A Nondisplaced fracture of fifth metatarsal bone, right foot, initial encounter for closed fracture: Secondary | ICD-10-CM | POA: Diagnosis not present

## 2023-08-04 DIAGNOSIS — S93401A Sprain of unspecified ligament of right ankle, initial encounter: Secondary | ICD-10-CM | POA: Diagnosis not present

## 2023-08-04 DIAGNOSIS — A419 Sepsis, unspecified organism: Secondary | ICD-10-CM | POA: Diagnosis not present

## 2023-08-04 DIAGNOSIS — N390 Urinary tract infection, site not specified: Secondary | ICD-10-CM | POA: Diagnosis not present

## 2023-08-04 DIAGNOSIS — G43909 Migraine, unspecified, not intractable, without status migrainosus: Secondary | ICD-10-CM | POA: Diagnosis not present

## 2023-08-04 DIAGNOSIS — R6520 Severe sepsis without septic shock: Secondary | ICD-10-CM | POA: Diagnosis not present

## 2023-08-04 DIAGNOSIS — K279 Peptic ulcer, site unspecified, unspecified as acute or chronic, without hemorrhage or perforation: Secondary | ICD-10-CM | POA: Diagnosis not present

## 2023-08-04 DIAGNOSIS — N179 Acute kidney failure, unspecified: Secondary | ICD-10-CM | POA: Diagnosis not present

## 2023-08-09 ENCOUNTER — Encounter: Payer: Self-pay | Admitting: Certified Registered Nurse Anesthetist

## 2023-08-11 ENCOUNTER — Ambulatory Visit: Payer: Medicare Other | Admitting: Gastroenterology

## 2023-08-11 ENCOUNTER — Encounter: Payer: Self-pay | Admitting: Gastroenterology

## 2023-08-11 VITALS — BP 113/74 | HR 96 | Temp 97.3°F | Resp 13 | Ht 62.0 in | Wt 101.0 lb

## 2023-08-11 DIAGNOSIS — R1310 Dysphagia, unspecified: Secondary | ICD-10-CM | POA: Diagnosis not present

## 2023-08-11 DIAGNOSIS — I10 Essential (primary) hypertension: Secondary | ICD-10-CM | POA: Diagnosis not present

## 2023-08-11 DIAGNOSIS — K222 Esophageal obstruction: Secondary | ICD-10-CM

## 2023-08-11 MED ORDER — SODIUM CHLORIDE 0.9 % IV SOLN: 500.0000 mL | Freq: Once | INTRAVENOUS | Status: DC

## 2023-08-11 NOTE — Progress Notes (Signed)
1545 HR > 100 with esmolol 25 mg given IV, MD updated, vss

## 2023-08-11 NOTE — Op Note (Signed)
[facility] Patient Name: Teresa Lozano Procedure Date: 08/11/2023 3:33 PM MRN: [id number] Endoscopist: Viviann Spare P. Adela Lank , MD, [id number] Age: 73 Referring MD:  Date of Birth: [birth year] Gender: Female Account #: [id number] Procedure:                Upper GI endoscopy Indications:              Dysphagia, For therapy of esophageal stricture -                            history of Bilroth II with vagotomy, poor gastric                            clearance - EGD on 07/11/23 dilated severe GEJ                            stricture with the scope itself. Here for repeat                            EGD with dilation. Patient was supposed to be on                            clear liquid diet prior to the procedure. She has                            been maintained on soft diet but eating                            mashpotatoes, macaroni, etc. Medicines:                Monitored Anesthesia Care Procedure:                Pre-Anesthesia Assessment:                           - Prior to the procedure, a History and Physical                            was performed, and patient medications and                            allergies were reviewed. The patient's tolerance of                            previous anesthesia was also reviewed. The risks                            and benefits of the procedure and the sedation                            options and risks were discussed with the patient.                            All questions were answered, and informed consent  was obtained. Prior Anticoagulants: The patient has                            taken no anticoagulant or antiplatelet agents. ASA                            Grade Assessment: II - A patient with mild systemic                            disease. After reviewing the risks and benefits,                            the patient was deemed in satisfactory condition to                             undergo the procedure.                           After obtaining informed consent, the endoscope was                            passed under direct vision. Throughout the                            procedure, the patient's blood pressure, pulse, and                            oxygen saturations were monitored continuously. The                            GIF [id number] #[id number] was introduced through the                            mouth, with the intention of advancing to the                            duodenum. The scope was advanced to the gastric                            body before the procedure was aborted. Medications                            were given. The upper GI endoscopy was accomplished                            without difficulty. The patient tolerated the                            procedure well. Scope In: Scope Out: Findings:                 One benign-appearing, intrinsic moderate stenosis                            was found  at the [facility]. It was wider than previous                            and the scope itself did not dilate it as it did                            the last time. I was going to do a balloon dilation                            but given amount of food in the stomach the                            procedure was aborted due to aspiration risk.                           The exam of the esophagus was otherwise normal.                           A large amount of food (residue) was found in the                            gastric body. Given this finding the procedure was                            aborted due to aspiration risk. Complications:            No immediate complications. Estimated blood loss:                            None. Estimated Blood Loss:     Estimated blood loss: none. Impression:               - Benign-appearing esophageal stenosis at the GEJ.                            Improved luminal diameter since the last exam but                             would still benefit from dilation - not performed                            today given retained food in the stomach.                           - A large amount of food (residue) in the stomach. Recommendation:           - Patient has a contact number available for                            emergencies. The signs and symptoms of potential                            delayed complications were discussed with the  patient. Return to normal activities tomorrow.                            Written discharge instructions were provided to the                            patient.                           - Resume previous diet (soft diet at baseline)                           - Continue present medications.                           - If the patient is willing, repeat upper endoscopy                            for retreatment. SHE SHOULD BE ON CLEAR LIQUID DIET                            FOR AT LEAST 24 HOURS PRE-PROCEDURE given the last                            2 exams were limited by retained gastric contents. Viviann Spare P. Charell Faulk, MD 08/11/2023 3:56:11 PM This report has been signed electronically.

## 2023-08-11 NOTE — Progress Notes (Signed)
[facility] History and Physical   Primary Care Physician:  Tisovec, Adelfa Koh, MD   Reason for Procedure:   Dysphagia, esophageal stricture  Plan:    EGD with dilation     HPI: Teresa Lozano is a 73 y.o. female  here for EGD to re-evaluate and treat known esophageal stricture. Previously had an EGD 07/11/23 - GEJ Stricture that has been dilated in the past, recurred and dilated with the scope itself. Repeat EGD with dilation planned today.   Otherwise feels well without any cardiopulmonary symptoms.   I have discussed risks / benefits of anesthesia and endoscopic procedure with Rod Can and they wish to proceed with the exams as outlined today.    Past Medical History:  Diagnosis Date   Allergy    Arthritis    Chicken pox    Frequent headaches    GERD (gastroesophageal reflux disease)    History of kidney stones    Hyperlipidemia    Hypertension    IDA (iron deficiency anemia)    Osteoporosis     Past Surgical History:  Procedure Laterality Date   CERVICAL FUSION  2000   COLONOSCOPY  01/18/2019   COLONOSCOPY WITH PROPOFOL  08/23/2022   CYSTOSCOPY/RETROGRADE/URETEROSCOPY Left 07/27/2023   Procedure: CYSTOSCOPY/RETROGRADE/ LEFT STENT PLACEMENT;  Surgeon: Joline Maxcy, MD;  Location: [facility];  Service: Urology;  Laterality: Left;   ESOPHAGOGASTRODUODENOSCOPY  11/2016   ESOPHAGOGASTRODUODENOSCOPY (EGD) WITH PROPOFOL N/A 12/28/2016   Procedure: ESOPHAGOGASTRODUODENOSCOPY (EGD) WITH PROPOFOL;  Surgeon: Ruffin Frederick, MD;  Location: [facility];  Service: Gastroenterology;  Laterality: N/A;   EYE SURGERY     STOMACH SURGERY  04/2017   REMOVED ULCER   TONSILLECTOMY     UPPER GASTROINTESTINAL ENDOSCOPY  07/2022    Prior to Admission medications   Medication Sig Start Date End Date Taking? Authorizing Provider  amLODipine (NORVASC) 10 MG tablet Take 10 mg by mouth daily. 06/25/22  Yes [provider]  cetirizine (ZYRTEC) 10 MG  tablet Take 10 mg by mouth daily.   Yes [provider]  Cholecalciferol (VITAMIN D3) 125 MCG (5000 UT) CAPS Take 5,000 Units by mouth daily.   Yes [provider]  CRANBERRY PO Take 650 mg by mouth daily.   Yes [provider]  cyclobenzaprine (FLEXERIL) 5 MG tablet Take 1 tablet (5 mg total) by mouth 2 (two) times daily as needed for muscle spasms. Patient taking differently: Take 10 mg by mouth at bedtime. 06/02/23  Yes Lomax, Amy, NP  ferrous sulfate 325 (65 FE) MG tablet TAKE 1 TABLET BY MOUTH EVERY DAY WITH BREAKFAST Patient taking differently: Take 325 mg by mouth daily with breakfast. 05/05/23  Yes Terryon Pineiro, Willaim Rayas, MD  imipramine (TOFRANIL) 25 MG tablet Take 3 tablets (75 mg total) by mouth at bedtime. 06/02/23  Yes Lomax, Amy, NP  levETIRAcetam (KEPPRA) 500 MG tablet Take 1 tablet (500 mg total) by mouth 2 (two) times daily. 06/02/23  Yes Lomax, Amy, NP  Magnesium 500 MG CAPS Take 500 mg by mouth in the morning.   Yes [provider]  Multiple Vitamin (MULTI-VITAMIN DAILY PO) Take 1 tablet by mouth daily with breakfast.   Yes [provider]  omeprazole (PRILOSEC) 40 MG capsule TAKE 1 CAPSULE BY MOUTH TWICE A DAY 05/05/23  Yes Kennedy-Smith, Malachi Carl, NP  polyethylene glycol (MIRALAX / GLYCOLAX) packet Take 17 g by mouth daily.   Yes [provider]  rosuvastatin (CRESTOR) 10 MG tablet Take 10  mg by mouth daily. 08/21/21  Yes [provider]  sucralfate (CARAFATE) 1 g tablet Take 1 tablet (1 g total) by mouth as needed. Patient taking differently: Take 1 g by mouth daily as needed (for GERD-like symptoms). 02/04/23  Yes Lansing Sigmon, Willaim Rayas, MD  SUMAtriptan (IMITREX) 100 MG tablet TAKE 1 TAB AS NEEDED FOR MIGRAINE. MAY REPEAT IN 2 HRS IF HEADACHE PERSISTS OR RECURS. Patient taking differently: Take 100 mg by mouth See admin instructions. TAKE 100 MG BY MOUTH AS NEEDED FOR MIGRAINE. MAY REPEAT IN 2 HRS IF HEADACHE PERSISTS OR  RECURS. 06/02/23  Yes Lomax, Amy, NP  butalbital-acetaminophen-caffeine (FIORICET) 50-325-40 MG tablet One to two tablets by mouth as needed for headache. No more than 4/week Patient taking differently: Take 1-2 tablets by mouth daily as needed for headache (NOT TO EXCEED 4 TABLETS/WEEK). One to two tablets by mouth as needed for headache. No more than 4/week 06/02/23   Lomax, Amy, NP  RECLAST 5 MG/100ML SOLN injection Inject 5 mg into the vein once.    [provider]    Current Outpatient Medications  Medication Sig Dispense Refill   amLODipine (NORVASC) 10 MG tablet Take 10 mg by mouth daily.     cetirizine (ZYRTEC) 10 MG tablet Take 10 mg by mouth daily.     Cholecalciferol (VITAMIN D3) 125 MCG (5000 UT) CAPS Take 5,000 Units by mouth daily.     CRANBERRY PO Take 650 mg by mouth daily.     cyclobenzaprine (FLEXERIL) 5 MG tablet Take 1 tablet (5 mg total) by mouth 2 (two) times daily as needed for muscle spasms. (Patient taking differently: Take 10 mg by mouth at bedtime.) 180 tablet 3   ferrous sulfate 325 (65 FE) MG tablet TAKE 1 TABLET BY MOUTH EVERY DAY WITH BREAKFAST (Patient taking differently: Take 325 mg by mouth daily with breakfast.) 90 tablet 1   imipramine (TOFRANIL) 25 MG tablet Take 3 tablets (75 mg total) by mouth at bedtime. 270 tablet 3   levETIRAcetam (KEPPRA) 500 MG tablet Take 1 tablet (500 mg total) by mouth 2 (two) times daily. 180 tablet 3   Magnesium 500 MG CAPS Take 500 mg by mouth in the morning.     Multiple Vitamin (MULTI-VITAMIN DAILY PO) Take 1 tablet by mouth daily with breakfast.     omeprazole (PRILOSEC) 40 MG capsule TAKE 1 CAPSULE BY MOUTH TWICE A DAY 180 capsule 1   polyethylene glycol (MIRALAX / GLYCOLAX) packet Take 17 g by mouth daily.     rosuvastatin (CRESTOR) 10 MG tablet Take 10 mg by mouth daily.     sucralfate (CARAFATE) 1 g tablet Take 1 tablet (1 g total) by mouth as needed. (Patient taking differently: Take 1 g by mouth daily as needed  (for GERD-like symptoms).)     SUMAtriptan (IMITREX) 100 MG tablet TAKE 1 TAB AS NEEDED FOR MIGRAINE. MAY REPEAT IN 2 HRS IF HEADACHE PERSISTS OR RECURS. (Patient taking differently: Take 100 mg by mouth See admin instructions. TAKE 100 MG BY MOUTH AS NEEDED FOR MIGRAINE. MAY REPEAT IN 2 HRS IF HEADACHE PERSISTS OR RECURS.) 10 tablet 11   butalbital-acetaminophen-caffeine (FIORICET) 50-325-40 MG tablet One to two tablets by mouth as needed for headache. No more than 4/week (Patient taking differently: Take 1-2 tablets by mouth daily as needed for headache (NOT TO EXCEED 4 TABLETS/WEEK). One to two tablets by mouth as needed for headache. No more than 4/week) 16 tablet 2   RECLAST 5 MG/100ML  SOLN injection Inject 5 mg into the vein once.     Current Facility-Administered Medications  Medication Dose Route Frequency Provider Last Rate Last Admin   0.9 %  sodium chloride infusion  500 mL Intravenous Once Tanja Gift, Willaim Rayas, MD        Allergies as of 08/11/2023 - Review Complete 08/11/2023  Allergen Reaction Noted   Lisinopril Other (See Comments) and Cough 04/27/2016   Robaxin [methocarbamol] Nausea And Vomiting 04/10/2018    Family History  Problem Relation Age of Onset   Hyperlipidemia Mother    Hypertension Mother    Prostate cancer Father    Hyperlipidemia Father    Heart disease Father    Heart disease Brother    Hypertension Sister    Esophageal cancer Sister    Hypertension Maternal Grandmother    Colon cancer Neg Hx    Colon polyps Neg Hx    Stomach cancer Neg Hx    Rectal cancer Neg Hx     Social History   Socioeconomic History   Marital status: Widowed    Spouse name: Not on file   Number of children: Not on file   Years of education: Not on file   Highest education level: Not on file  Occupational History   Not on file  Tobacco Use   Smoking status: Never   Smokeless tobacco: Never  Vaping Use   Vaping status: Never Used  Substance and Sexual Activity    Alcohol use: No    Alcohol/week: 0.0 standard drinks of alcohol   Drug use: No   Sexual activity: Not Currently  Other Topics Concern   Not on file  Social History Narrative   Not on file   Social Drivers of Health   Financial Resource Strain: Not on file  Food Insecurity: No Food Insecurity (07/27/2023)   Hunger Vital Sign    Worried About Running Out of Food in the Last Year: Never true    Ran Out of Food in the Last Year: Never true  Transportation Needs: No Transportation Needs (07/27/2023)   PRAPARE - Administrator, Civil Service (Medical): No    Lack of Transportation (Non-Medical): No  Physical Activity: Not on file  Stress: Not on file  Social Connections: Unknown (07/27/2023)   Social Connection and Isolation Panel [NHANES]    Frequency of Communication with Friends and Family: More than three times a week    Frequency of Social Gatherings with Friends and Family: Twice a week    Attends Religious Services: 1 to 4 times per year    Active Member of [employer] or Organizations: Yes    Attends Banker Meetings: 1 to 4 times per year    Marital Status: Not on file  Intimate Partner Violence: Patient Declined (07/27/2023)   Humiliation, Afraid, Rape, and Kick questionnaire    Fear of Current or Ex-Partner: Patient declined    Emotionally Abused: Patient declined    Physically Abused: Patient declined    Sexually Abused: Patient declined    Review of Systems: All other review of systems negative except as mentioned in the HPI.  Physical Exam: Vital signs BP 114/66 (Cuff Size: Normal)   Pulse (!) 115   Temp (!) 97.3 F (36.3 C)   Ht 5\' 2"  (1.575 m)   Wt 101 lb (45.8 kg)   SpO2 99%   BMI 18.47 kg/m   General:   Alert,  Well-developed, pleasant and cooperative in NAD Lungs:  Clear throughout  to auscultation.   Heart:  Regular rate and rhythm Abdomen:  Soft, nontender and nondistended.   Neuro/Psych:  Alert and cooperative. Normal mood and affect.  A and O x 3  Harlin Rain, MD [facility]

## 2023-08-11 NOTE — Progress Notes (Signed)
1541 Robinul 0.1 mg IV given due large amount of secretions upon assessment.  MD made aware, vss

## 2023-08-11 NOTE — Patient Instructions (Signed)
-   Resume previous diet (soft diet at baseline) - Continue present medications. - If the patient is willing, repeat upper endoscopy for retreatment. SHE SHOULD BE ON CLEAR LIQUID DIET FOR AT LEAST 24 HOURS PRE-PROCEDURE given the last 2 exams were limited by retained gastric contents.   YOU HAD AN ENDOSCOPIC PROCEDURE TODAY AT THE [facility]:   Refer to the procedure report that was given to you for any specific questions about what was found during the examination.  If the procedure report does not answer your questions, please call your gastroenterologist to clarify.  If you requested that your care partner not be given the details of your procedure findings, then the procedure report has been included in a sealed envelope for you to review at your convenience later.  YOU SHOULD EXPECT: Some feelings of bloating in the abdomen. Passage of more gas than usual.  Walking can help get rid of the air that was put into your GI tract during the procedure and reduce the bloating. If you had a lower endoscopy (such as a colonoscopy or flexible sigmoidoscopy) you may notice spotting of blood in your stool or on the toilet paper. If you underwent a bowel prep for your procedure, you may not have a normal bowel movement for a few days.  Please Note:  You might notice some irritation and congestion in your nose or some drainage.  This is from the oxygen used during your procedure.  There is no need for concern and it should clear up in a day or so.  SYMPTOMS TO REPORT IMMEDIATELY: Following upper endoscopy (EGD)  Vomiting of blood or coffee ground material  New chest pain or pain under the shoulder blades  Painful or persistently difficult swallowing  New shortness of breath  Fever of 100F or higher  Black, tarry-looking stools  For urgent or emergent issues, a gastroenterologist can be reached at any hour by calling [phone]. Do not use MyChart messaging for urgent concerns.     DIET:  We do recommend a small meal at first, but then you may proceed to your regular diet.  Drink plenty of fluids but you should avoid alcoholic beverages for 24 hours.  ACTIVITY:  You should plan to take it easy for the rest of today and you should NOT DRIVE or use heavy machinery until tomorrow (because of the sedation medicines used during the test).    FOLLOW UP: Our staff will call the number listed on your records the next business day following your procedure.  We will call around 7:15- 8:00 am to check on you and address any questions or concerns that you may have regarding the information given to you following your procedure. If we do not reach you, we will leave a message.     If any biopsies were taken you will be contacted by phone or by letter within the next 1-3 weeks.  Please call us at [phone] if you have not heard about the biopsies in 3 weeks.    SIGNATURES/CONFIDENTIALITY: You and/or your care partner have signed paperwork which will be entered into your electronic medical record.  These signatures attest to the fact that that the information above on your After Visit Summary has been reviewed and is understood.  Full responsibility of the confidentiality of this discharge information lies with you and/or your care-partner.

## 2023-08-11 NOTE — Progress Notes (Signed)
Report given to PACU, vss 

## 2023-08-12 ENCOUNTER — Telehealth: Payer: Self-pay | Admitting: *Deleted

## 2023-08-12 NOTE — Telephone Encounter (Signed)
  Follow up Call-     08/11/2023    3:09 PM 07/11/2023    9:09 AM 08/23/2022    7:37 AM 07/30/2022    9:10 AM 07/08/2022   10:27 AM  Call back number  Post procedure Call Back phone  # [phone]  Permission to leave phone message Yes Yes Yes Yes Yes     Patient questions:  Do you have a fever, pain , or abdominal swelling? No. Pain Score  0 *  Have you tolerated food without any problems? Yes.    Have you been able to return to your normal activities? Yes.    Do you have any questions about your discharge instructions: Diet   No. Medications  No. Follow up visit  No.  Do you have questions or concerns about your Care? No.  Actions: * If pain score is 4 or above: No action needed, pain <4.

## 2023-08-19 DIAGNOSIS — R8271 Bacteriuria: Secondary | ICD-10-CM | POA: Diagnosis not present

## 2023-08-19 DIAGNOSIS — N139 Obstructive and reflux uropathy, unspecified: Secondary | ICD-10-CM | POA: Diagnosis not present

## 2023-08-19 DIAGNOSIS — N201 Calculus of ureter: Secondary | ICD-10-CM | POA: Diagnosis not present

## 2023-08-30 ENCOUNTER — Other Ambulatory Visit: Payer: Self-pay | Admitting: Gastroenterology

## 2023-08-30 ENCOUNTER — Other Ambulatory Visit: Payer: Self-pay | Admitting: Family Medicine

## 2023-09-01 ENCOUNTER — Other Ambulatory Visit (INDEPENDENT_AMBULATORY_CARE_PROVIDER_SITE_OTHER): Payer: Medicare Other

## 2023-09-01 ENCOUNTER — Other Ambulatory Visit: Payer: Self-pay

## 2023-09-01 ENCOUNTER — Other Ambulatory Visit: Payer: Self-pay | Admitting: *Deleted

## 2023-09-01 DIAGNOSIS — E611 Iron deficiency: Secondary | ICD-10-CM

## 2023-09-01 LAB — HEMOGLOBIN: Hemoglobin: 13.4 g/dL (ref 12.0–15.0)

## 2023-09-01 LAB — IBC + FERRITIN
Ferritin: 36.4 ng/mL (ref 10.0–291.0)
Iron: 40 ug/dL — ABNORMAL LOW (ref 42–145)
Saturation Ratios: 11.3 % — ABNORMAL LOW (ref 20.0–50.0)
TIBC: 354.2 ug/dL (ref 250.0–450.0)
Transferrin: 253 mg/dL (ref 212.0–360.0)

## 2023-09-01 MED ORDER — CYCLOBENZAPRINE HCL 5 MG PO TABS: 5.0000 mg | ORAL_TABLET | Freq: Two times a day (BID) | ORAL | 3 refills | Status: DC | PRN

## 2023-09-02 ENCOUNTER — Encounter: Payer: Self-pay | Admitting: Gastroenterology

## 2023-09-02 DIAGNOSIS — S92354A Nondisplaced fracture of fifth metatarsal bone, right foot, initial encounter for closed fracture: Secondary | ICD-10-CM | POA: Diagnosis not present

## 2023-09-05 ENCOUNTER — Ambulatory Visit: Payer: Medicare Other | Admitting: Family Medicine

## 2023-09-05 ENCOUNTER — Other Ambulatory Visit: Payer: Self-pay | Admitting: Family Medicine

## 2023-09-05 MED ORDER — BUTALBITAL-APAP-CAFFEINE 50-325-40 MG PO TABS: ORAL_TABLET | ORAL | 2 refills | Status: DC

## 2023-09-05 NOTE — Telephone Encounter (Signed)
 Pt called stating that [facility] requested her to call the office for the refill on her butalbital-acetaminophen-caffeine (FIORICET) 50-325-40 MG tablet  Please advise.

## 2023-09-05 NOTE — Telephone Encounter (Signed)
 Pt last seen 06/02/23 and next f/u 09/26/23. Last refilled Fioricet 07/20/23 #16.  Called pt. Relayed I am sending request to Dr. Epimenio Foot since Amy out today. She verbalized understanding.

## 2023-09-14 DIAGNOSIS — N201 Calculus of ureter: Secondary | ICD-10-CM | POA: Diagnosis not present

## 2023-09-16 ENCOUNTER — Telehealth: Payer: Self-pay

## 2023-09-16 ENCOUNTER — Other Ambulatory Visit (HOSPITAL_COMMUNITY): Payer: Self-pay

## 2023-09-16 ENCOUNTER — Telehealth: Payer: Self-pay | Admitting: Family Medicine

## 2023-09-16 NOTE — Telephone Encounter (Signed)
 Pt reports that [facility] [phone] is telling her they need to change her cyclobenzaprine (FLEXERIL) 5 MG tablet prescription, pt is asking for a call to discuss

## 2023-09-16 NOTE — Telephone Encounter (Signed)
 PA request has been Submitted. New Encounter created for follow up. For additional info see Pharmacy Prior Auth telephone encounter from 09/16/2023.

## 2023-09-16 NOTE — Telephone Encounter (Signed)
 Called [facility] at [phone]. Spoke w/ Trinna Post. States since pt over 11, needs PA d/t anticholinergic effects. Aware we will work on [place].  I called pt to update her. LVM letting her know we will send PA to PA team. We will call with update once we hear back from them. Asked her to call back if any further questions.

## 2023-09-16 NOTE — Telephone Encounter (Signed)
 Pharmacy Patient Advocate Encounter   Received notification from Pt Calls Messages that prior authorization for Cyclobenzaprine HCl 5MG  tablets is required/requested.   Insurance verification completed.   The patient is insured through [employer] .   Per test claim: PA required; PA submitted to above mentioned insurance via CoverMyMeds Key/confirmation #/EOC [id number] Status is pending

## 2023-09-19 ENCOUNTER — Telehealth: Payer: Self-pay | Admitting: Family Medicine

## 2023-09-19 NOTE — Telephone Encounter (Signed)
 Pt cancelled appointment due to have not scheduled dry needling appt yety. Need have that done before appt with Amy

## 2023-09-21 ENCOUNTER — Ambulatory Visit (AMBULATORY_SURGERY_CENTER): Payer: Medicare Other | Admitting: Gastroenterology

## 2023-09-21 ENCOUNTER — Encounter: Payer: Self-pay | Admitting: Gastroenterology

## 2023-09-21 VITALS — BP 104/57 | HR 108 | Temp 97.2°F | Resp 18 | Ht 62.0 in | Wt 82.0 lb

## 2023-09-21 DIAGNOSIS — R1310 Dysphagia, unspecified: Secondary | ICD-10-CM

## 2023-09-21 DIAGNOSIS — E785 Hyperlipidemia, unspecified: Secondary | ICD-10-CM | POA: Diagnosis not present

## 2023-09-21 DIAGNOSIS — I10 Essential (primary) hypertension: Secondary | ICD-10-CM | POA: Diagnosis not present

## 2023-09-21 DIAGNOSIS — K222 Esophageal obstruction: Secondary | ICD-10-CM

## 2023-09-21 MED ORDER — SODIUM CHLORIDE 0.9 % IV SOLN
500.0000 mL | INTRAVENOUS | Status: DC
Start: 2023-09-21 — End: 2023-09-21

## 2023-09-21 MED ORDER — OMEPRAZOLE 40 MG PO CPDR: 40.0000 mg | DELAYED_RELEASE_CAPSULE | Freq: Two times a day (BID) | ORAL | 1 refills | Status: DC

## 2023-09-21 NOTE — Patient Instructions (Signed)
    Esophagus was dilated to day- due to this & retained food in stomach , Dr Adela Lank recommends liqiuids then advance to soft foods as tolerated today & indefinitely      Continue previous  medications   YOU HAD AN ENDOSCOPIC PROCEDURE TODAY AT THE [facility]:   Refer to the procedure report that was given to you for any specific questions about what was found during the examination.  If the procedure report does not answer your questions, please call your gastroenterologist to clarify.  If you requested that your care partner not be given the details of your procedure findings, then the procedure report has been included in a sealed envelope for you to review at your convenience later.  YOU SHOULD EXPECT: Some feelings of bloating in the abdomen. Passage of more gas than usual.  Walking can help get rid of the air that was put into your GI tract during the procedure and reduce the bloating. If you had a lower endoscopy (such as a colonoscopy or flexible sigmoidoscopy) you may notice spotting of blood in your stool or on the toilet paper. If you underwent a bowel prep for your procedure, you may not have a normal bowel movement for a few days.  Please Note:  You might notice some irritation and congestion in your nose or some drainage.  This is from the oxygen used during your procedure.  There is no need for concern and it should clear up in a day or so.  SYMPTOMS TO REPORT IMMEDIATELY:   Following upper endoscopy (EGD)  Vomiting of blood or coffee ground material  New chest pain or pain under the shoulder blades  Painful or persistently difficult swallowing  New shortness of breath  Fever of 100F or higher  Black, tarry-looking stools  For urgent or emergent issues, a gastroenterologist can be reached at any hour by calling [phone]. Do not use MyChart messaging for urgent concerns.    DIET:  We do recommend a small meal at first, but then you may proceed to  your regular diet.  Drink plenty of fluids but you should avoid alcoholic beverages for 24 hours.  ACTIVITY:  You should plan to take it easy for the rest of today and you should NOT DRIVE or use heavy machinery until tomorrow (because of the sedation medicines used during the test).    FOLLOW UP: Our staff will call the number listed on your records the next business day following your procedure.  We will call around 7:15- 8:00 am to check on you and address any questions or concerns that you may have regarding the information given to you following your procedure. If we do not reach you, we will leave a message.     If any biopsies were taken you will be contacted by phone or by letter within the next 1-3 weeks.  Please call us at [phone] if you have not heard about the biopsies in 3 weeks.    SIGNATURES/CONFIDENTIALITY: You and/or your care partner have signed paperwork which will be entered into your electronic medical record.  These signatures attest to the fact that that the information above on your After Visit Summary has been reviewed and is understood.  Full responsibility of the confidentiality of this discharge information lies with you and/or your care-partner.

## 2023-09-21 NOTE — Progress Notes (Signed)
 [facility] History and Physical   Primary Care Physician:  Tisovec, Adelfa Koh, MD   Reason for Procedure:   Dysphagia, esophageal stricture  Plan:    EGD with dilation     HPI: Teresa Lozano is a 73 y.o. female  here for EGD with dilation of GEJ stricture - seen on 1/23 for procedure but gastric contents present and procedure aborted. Patient supposed to be on clear liquid diet for 24 hours prior to this exam.   Otherwise feels well without any cardiopulmonary symptoms.   I have discussed risks / benefits of anesthesia and endoscopic procedure with Rod Can and they wish to proceed with the exams as outlined today.    Past Medical History:  Diagnosis Date   Allergy    Arthritis    Chicken pox    Frequent headaches    GERD (gastroesophageal reflux disease)    History of kidney stones    Hyperlipidemia    Hypertension    IDA (iron deficiency anemia)    Kidney stone on left side    Osteoporosis     Past Surgical History:  Procedure Laterality Date   CERVICAL FUSION  2000   COLONOSCOPY  01/18/2019   COLONOSCOPY WITH PROPOFOL  08/23/2022   CYSTOSCOPY/RETROGRADE/URETEROSCOPY Left 07/27/2023   Procedure: CYSTOSCOPY/RETROGRADE/ LEFT STENT PLACEMENT;  Surgeon: Joline Maxcy, MD;  Location: [facility];  Service: Urology;  Laterality: Left;   ESOPHAGOGASTRODUODENOSCOPY  11/2016   ESOPHAGOGASTRODUODENOSCOPY (EGD) WITH PROPOFOL N/A 12/28/2016   Procedure: ESOPHAGOGASTRODUODENOSCOPY (EGD) WITH PROPOFOL;  Surgeon: Ruffin Frederick, MD;  Location: [facility];  Service: Gastroenterology;  Laterality: N/A;   EYE SURGERY     STOMACH SURGERY  04/2017   REMOVED ULCER   TONSILLECTOMY     UPPER GASTROINTESTINAL ENDOSCOPY  07/2022    Prior to Admission medications   Medication Sig Start Date End Date Taking? Authorizing Provider  amLODipine (NORVASC) 10 MG tablet Take 10 mg by mouth daily. 06/25/22  Yes [provider]  amoxicillin-clavulanate  (AUGMENTIN) 875-125 MG tablet Take 1 tablet by mouth 2 (two) times daily. 09/14/23  Yes [provider]  cetirizine (ZYRTEC) 10 MG tablet Take 10 mg by mouth daily.   Yes [provider]  Cholecalciferol (VITAMIN D3) 125 MCG (5000 UT) CAPS Take 5,000 Units by mouth daily.   Yes [provider]  CRANBERRY PO Take 650 mg by mouth daily.   Yes [provider]  ferrous sulfate 325 (65 FE) MG tablet TAKE 1 TABLET BY MOUTH EVERY DAY WITH BREAKFAST 08/30/23  Yes Aarianna Hoadley, Willaim Rayas, MD  levETIRAcetam (KEPPRA) 500 MG tablet Take 1 tablet (500 mg total) by mouth 2 (two) times daily. 06/02/23  Yes Lomax, Amy, NP  Magnesium 500 MG CAPS Take 500 mg by mouth in the morning.   Yes [provider]  Multiple Vitamin (MULTI-VITAMIN DAILY PO) Take 1 tablet by mouth daily with breakfast.   Yes [provider]  omeprazole (PRILOSEC) 40 MG capsule TAKE 1 CAPSULE BY MOUTH TWICE A DAY 05/05/23  Yes Arnaldo Natal, NP  rosuvastatin (CRESTOR) 10 MG tablet Take 10 mg by mouth daily. 08/21/21  Yes [provider]  butalbital-acetaminophen-caffeine (FIORICET) 50-325-40 MG tablet One to two tablets by mouth as needed for headache. No more than 4/week 09/05/23   Sater, Pearletha Furl, MD  cyclobenzaprine (FLEXERIL) 5 MG tablet Take 1 tablet (5 mg total) by mouth 2 (two) times daily as needed for muscle spasms. Patient not taking: Reported  on 09/21/2023 09/01/23   Lomax, Amy, NP  imipramine (TOFRANIL) 25 MG tablet Take 3 tablets (75 mg total) by mouth at bedtime. 06/02/23   Lomax, Amy, NP  polyethylene glycol (MIRALAX / GLYCOLAX) packet Take 17 g by mouth daily.    [provider]  RECLAST 5 MG/100ML SOLN injection Inject 5 mg into the vein once.    [provider]  sucralfate (CARAFATE) 1 g tablet Take 1 tablet (1 g total) by mouth as needed. Patient taking differently: Take 1 g by mouth daily as needed (for GERD-like symptoms). 02/04/23   Jamas Jaquay,  Willaim Rayas, MD  SUMAtriptan (IMITREX) 100 MG tablet TAKE 1 TAB AS NEEDED FOR MIGRAINE. MAY REPEAT IN 2 HRS IF HEADACHE PERSISTS OR RECURS. Patient taking differently: Take 100 mg by mouth See admin instructions. TAKE 100 MG BY MOUTH AS NEEDED FOR MIGRAINE. MAY REPEAT IN 2 HRS IF HEADACHE PERSISTS OR RECURS. 06/02/23   Lomax, Amy, NP    Current Outpatient Medications  Medication Sig Dispense Refill   amLODipine (NORVASC) 10 MG tablet Take 10 mg by mouth daily.     amoxicillin-clavulanate (AUGMENTIN) 875-125 MG tablet Take 1 tablet by mouth 2 (two) times daily.     cetirizine (ZYRTEC) 10 MG tablet Take 10 mg by mouth daily.     Cholecalciferol (VITAMIN D3) 125 MCG (5000 UT) CAPS Take 5,000 Units by mouth daily.     CRANBERRY PO Take 650 mg by mouth daily.     ferrous sulfate 325 (65 FE) MG tablet TAKE 1 TABLET BY MOUTH EVERY DAY WITH BREAKFAST 90 tablet 1   levETIRAcetam (KEPPRA) 500 MG tablet Take 1 tablet (500 mg total) by mouth 2 (two) times daily. 180 tablet 3   Magnesium 500 MG CAPS Take 500 mg by mouth in the morning.     Multiple Vitamin (MULTI-VITAMIN DAILY PO) Take 1 tablet by mouth daily with breakfast.     omeprazole (PRILOSEC) 40 MG capsule TAKE 1 CAPSULE BY MOUTH TWICE A DAY 180 capsule 1   rosuvastatin (CRESTOR) 10 MG tablet Take 10 mg by mouth daily.     butalbital-acetaminophen-caffeine (FIORICET) 50-325-40 MG tablet One to two tablets by mouth as needed for headache. No more than 4/week 16 tablet 2   cyclobenzaprine (FLEXERIL) 5 MG tablet Take 1 tablet (5 mg total) by mouth 2 (two) times daily as needed for muscle spasms. (Patient not taking: Reported on 09/21/2023) 180 tablet 3   imipramine (TOFRANIL) 25 MG tablet Take 3 tablets (75 mg total) by mouth at bedtime. 270 tablet 3   polyethylene glycol (MIRALAX / GLYCOLAX) packet Take 17 g by mouth daily.     RECLAST 5 MG/100ML SOLN injection Inject 5 mg into the vein once.     sucralfate (CARAFATE) 1 g tablet Take 1 tablet (1 g total)  by mouth as needed. (Patient taking differently: Take 1 g by mouth daily as needed (for GERD-like symptoms).)     SUMAtriptan (IMITREX) 100 MG tablet TAKE 1 TAB AS NEEDED FOR MIGRAINE. MAY REPEAT IN 2 HRS IF HEADACHE PERSISTS OR RECURS. (Patient taking differently: Take 100 mg by mouth See admin instructions. TAKE 100 MG BY MOUTH AS NEEDED FOR MIGRAINE. MAY REPEAT IN 2 HRS IF HEADACHE PERSISTS OR RECURS.) 10 tablet 11   Current Facility-Administered Medications  Medication Dose Route Frequency Provider Last Rate Last Admin   0.9 %  sodium chloride infusion  500 mL Intravenous Continuous Marlena Barbato, Willaim Rayas, MD  Allergies as of 09/21/2023 - Review Complete 09/21/2023  Allergen Reaction Noted   Lisinopril Other (See Comments) and Cough 04/27/2016   Robaxin [methocarbamol] Nausea And Vomiting 04/10/2018    Family History  Problem Relation Age of Onset   Hyperlipidemia Mother    Hypertension Mother    Prostate cancer Father    Hyperlipidemia Father    Heart disease Father    Heart disease Brother    Hypertension Sister    Esophageal cancer Sister    Hypertension Maternal Grandmother    Colon cancer Neg Hx    Colon polyps Neg Hx    Stomach cancer Neg Hx    Rectal cancer Neg Hx     Social History   Socioeconomic History   Marital status: Widowed    Spouse name: Not on file   Number of children: Not on file   Years of education: Not on file   Highest education level: Not on file  Occupational History   Not on file  Tobacco Use   Smoking status: Never   Smokeless tobacco: Never  Vaping Use   Vaping status: Never Used  Substance and Sexual Activity   Alcohol use: No    Alcohol/week: 0.0 standard drinks of alcohol   Drug use: No   Sexual activity: Not Currently  Other Topics Concern   Not on file  Social History Narrative   Not on file   Social Drivers of Health   Financial Resource Strain: Not on file  Food Insecurity: No Food Insecurity (07/27/2023)   Hunger  Vital Sign    Worried About Running Out of Food in the Last Year: Never true    Ran Out of Food in the Last Year: Never true  Transportation Needs: No Transportation Needs (07/27/2023)   PRAPARE - Administrator, Civil Service (Medical): No    Lack of Transportation (Non-Medical): No  Physical Activity: Not on file  Stress: Not on file  Social Connections: Unknown (07/27/2023)   Social Connection and Isolation Panel [NHANES]    Frequency of Communication with Friends and Family: More than three times a week    Frequency of Social Gatherings with Friends and Family: Twice a week    Attends Religious Services: 1 to 4 times per year    Active Member of [employer] or Organizations: Yes    Attends Banker Meetings: 1 to 4 times per year    Marital Status: Not on file  Intimate Partner Violence: Patient Declined (07/27/2023)   Humiliation, Afraid, Rape, and Kick questionnaire    Fear of Current or Ex-Partner: Patient declined    Emotionally Abused: Patient declined    Physically Abused: Patient declined    Sexually Abused: Patient declined    Review of Systems: All other review of systems negative except as mentioned in the HPI.  Physical Exam: Vital signs BP 97/60   Pulse (!) 120   Temp (!) 97.2 F (36.2 C)   Ht 5\' 2"  (1.575 m)   Wt 82 lb (37.2 kg)   SpO2 95%   BMI 15.00 kg/m   General:   Alert,  Well-developed, pleasant and cooperative in NAD Lungs:  Clear throughout to auscultation.   Heart:  Regular rate and rhythm Abdomen:  Soft, nontender and nondistended.   Neuro/Psych:  Alert and cooperative. Normal mood and affect. A and O x 3  Harlin Rain, MD [facility]

## 2023-09-21 NOTE — Op Note (Signed)
 [facility] Patient Name: Teresa Lozano Procedure Date: 09/21/2023 1:36 PM MRN: [id number] Endoscopist: Viviann Spare P. Adela Lank , MD, [id number] Age: 73 Referring MD:  Date of Birth: [birth year] Gender: Female Account #: [id number] Procedure:                Upper GI endoscopy Indications:              Dysphagia, for therapy of esophageal stricture -                            prior exams limited by retained food in the                            stomach, history of Billroth II with vagotomy.                            Patient on clear liquid diet since Monday morning. Medicines:                Monitored Anesthesia Care Procedure:                Pre-Anesthesia Assessment:                           - Prior to the procedure, a History and Physical                            was performed, and patient medications and                            allergies were reviewed. The patient's tolerance of                            previous anesthesia was also reviewed. The risks                            and benefits of the procedure and the sedation                            options and risks were discussed with the patient.                            All questions were answered, and informed consent                            was obtained. Prior Anticoagulants: The patient has                            taken no anticoagulant or antiplatelet agents. ASA                            Grade Assessment: II - A patient with mild systemic                            disease. After reviewing the risks and benefits,  the patient was deemed in satisfactory condition to                            undergo the procedure.                           After obtaining informed consent, the endoscope was                            passed under direct vision. Throughout the                            procedure, the patient's blood pressure, pulse, and                            oxygen  saturations were monitored continuously. The                            GIF [id number] #[id number] was introduced through the                            mouth, and advanced to the proximal jejunum. The                            upper GI endoscopy was accomplished without                            difficulty. The patient tolerated the procedure                            well. Scope In: Scope Out: Findings:                 The Z-line was regular.                           One benign-appearing, intrinsic moderate stenosis                            was found at the GEJ. The stenosis was traversed. A                            TTS dilator was passed through the scope. Dilation                            with a 04-29-11 mm balloon dilator was performed to                            10 mm, 11 mm and 12 mm.                           The exam of the esophagus was otherwise normal.                           Evidence of a patent Billroth II gastrojejunostomy  was found. The gastrojejunal anastomosis was                            characterized by healthy appearing mucosa.                           Food (residue) was found in the gastric body - very                            limited views of the stomach taken. Despite liquid                            diet for 48 hours she still has retained food in                            the stomach (albeit significantly improved compared                            to the last visit)                           The exam of the stomach was otherwise normal.                           The examined small bowel limb was normal. Complications:            No immediate complications. Estimated blood loss:                            Minimal. Estimated Blood Loss:     Estimated blood loss was minimal. Impression:               - Z-line regular.                           - Benign-appearing esophageal stenosis. Dilated to                             12mm.                           - Patent Billroth II gastrojejunostomy was found,                            characterized by healthy appearing mucosa.                           - Food (residue) in the stomach due to severe                            gastroparesis.                           - Normal examined small bowel limb. Recommendation:           - Patient has a contact number available for  emergencies. The signs and symptoms of potential                            delayed complications were discussed with the                            patient. Return to normal activities tomorrow.                            Written discharge instructions were provided to the                            patient.                           - Advance diet as tolerated.                           - Would stay on soft diet indefinitely given                            stricture and gastroparesis                           - Continue present medications.                           - Follow up as needed for recurrent dysphagia Willaim Rayas. Adela Lank, MD 09/21/2023 2:02:52 PM This report has been signed electronically.

## 2023-09-21 NOTE — Progress Notes (Signed)
 Sedate, gd SR, tolerated procedure well, VSS, report to RN

## 2023-09-22 ENCOUNTER — Telehealth: Payer: Self-pay

## 2023-09-22 NOTE — Telephone Encounter (Signed)
  Follow up Call-     09/21/2023   12:52 PM 08/11/2023    3:09 PM 07/11/2023    9:09 AM 08/23/2022    7:37 AM 07/30/2022    9:10 AM 07/08/2022   10:27 AM  Call back number  Post procedure Call Back phone  # [phone]  Permission to leave phone message Yes Yes Yes Yes Yes Yes     Patient questions:  Do you have a fever, pain , or abdominal swelling? No. Pain Score  0 *  Have you tolerated food without any problems? Yes.    Have you been able to return to your normal activities? Yes.    Do you have any questions about your discharge instructions: Diet   No. Medications  No. Follow up visit  No.  Do you have questions or concerns about your Care? No.  Actions: * If pain score is 4 or above: No action needed, pain <4.

## 2023-09-26 ENCOUNTER — Ambulatory Visit: Payer: Medicare Other | Admitting: Family Medicine

## 2023-09-28 ENCOUNTER — Other Ambulatory Visit: Payer: Self-pay | Admitting: *Deleted

## 2023-09-28 MED ORDER — ONDANSETRON HCL 4 MG PO TABS: 4.0000 mg | ORAL_TABLET | Freq: Three times a day (TID) | ORAL | 1 refills | Status: DC | PRN

## 2023-10-17 DIAGNOSIS — I10 Essential (primary) hypertension: Secondary | ICD-10-CM | POA: Diagnosis not present

## 2023-10-17 DIAGNOSIS — Z1212 Encounter for screening for malignant neoplasm of rectum: Secondary | ICD-10-CM | POA: Diagnosis not present

## 2023-10-17 DIAGNOSIS — M810 Age-related osteoporosis without current pathological fracture: Secondary | ICD-10-CM | POA: Diagnosis not present

## 2023-10-17 DIAGNOSIS — E781 Pure hyperglyceridemia: Secondary | ICD-10-CM | POA: Diagnosis not present

## 2023-10-17 DIAGNOSIS — E7800 Pure hypercholesterolemia, unspecified: Secondary | ICD-10-CM | POA: Diagnosis not present

## 2023-10-21 DIAGNOSIS — N201 Calculus of ureter: Secondary | ICD-10-CM | POA: Diagnosis not present

## 2023-10-24 DIAGNOSIS — Z1331 Encounter for screening for depression: Secondary | ICD-10-CM | POA: Diagnosis not present

## 2023-10-24 DIAGNOSIS — K222 Esophageal obstruction: Secondary | ICD-10-CM | POA: Diagnosis not present

## 2023-10-24 DIAGNOSIS — Z0000 Encounter for general adult medical examination without abnormal findings: Secondary | ICD-10-CM | POA: Diagnosis not present

## 2023-10-24 DIAGNOSIS — E7800 Pure hypercholesterolemia, unspecified: Secondary | ICD-10-CM | POA: Diagnosis not present

## 2023-10-24 DIAGNOSIS — M810 Age-related osteoporosis without current pathological fracture: Secondary | ICD-10-CM | POA: Diagnosis not present

## 2023-10-24 DIAGNOSIS — K219 Gastro-esophageal reflux disease without esophagitis: Secondary | ICD-10-CM | POA: Diagnosis not present

## 2023-10-24 DIAGNOSIS — I10 Essential (primary) hypertension: Secondary | ICD-10-CM | POA: Diagnosis not present

## 2023-10-24 DIAGNOSIS — K279 Peptic ulcer, site unspecified, unspecified as acute or chronic, without hemorrhage or perforation: Secondary | ICD-10-CM | POA: Diagnosis not present

## 2023-10-24 DIAGNOSIS — G43909 Migraine, unspecified, not intractable, without status migrainosus: Secondary | ICD-10-CM | POA: Diagnosis not present

## 2023-10-24 DIAGNOSIS — R82998 Other abnormal findings in urine: Secondary | ICD-10-CM | POA: Diagnosis not present

## 2023-10-24 DIAGNOSIS — Z1339 Encounter for screening examination for other mental health and behavioral disorders: Secondary | ICD-10-CM | POA: Diagnosis not present

## 2023-10-28 ENCOUNTER — Telehealth: Payer: Self-pay | Admitting: Family Medicine

## 2023-10-28 DIAGNOSIS — G43009 Migraine without aura, not intractable, without status migrainosus: Secondary | ICD-10-CM

## 2023-10-28 NOTE — Telephone Encounter (Signed)
 Pt is needing a new referral for dry needling sent to [facility].

## 2023-10-28 NOTE — Telephone Encounter (Signed)
 Order placed

## 2023-10-31 ENCOUNTER — Telehealth: Payer: Self-pay | Admitting: Family Medicine

## 2023-10-31 DIAGNOSIS — M810 Age-related osteoporosis without current pathological fracture: Secondary | ICD-10-CM | POA: Diagnosis not present

## 2023-10-31 NOTE — Telephone Encounter (Signed)
 Referral physical therapy fax to [facility]. Phone: [phone], Fax: [phone]

## 2023-11-10 ENCOUNTER — Telehealth: Payer: Self-pay | Admitting: Family Medicine

## 2023-11-10 DIAGNOSIS — L570 Actinic keratosis: Secondary | ICD-10-CM | POA: Diagnosis not present

## 2023-11-10 DIAGNOSIS — L814 Other melanin hyperpigmentation: Secondary | ICD-10-CM | POA: Diagnosis not present

## 2023-11-10 DIAGNOSIS — D224 Melanocytic nevi of scalp and neck: Secondary | ICD-10-CM | POA: Diagnosis not present

## 2023-11-10 DIAGNOSIS — L821 Other seborrheic keratosis: Secondary | ICD-10-CM | POA: Diagnosis not present

## 2023-11-10 DIAGNOSIS — L578 Other skin changes due to chronic exposure to nonionizing radiation: Secondary | ICD-10-CM | POA: Diagnosis not present

## 2023-11-10 DIAGNOSIS — D225 Melanocytic nevi of trunk: Secondary | ICD-10-CM | POA: Diagnosis not present

## 2023-11-10 NOTE — Telephone Encounter (Signed)
 LVM for pt to call office.  Phone room: if pt calls, please let her know referral already placed and sent on 10/31/23. She can f/u with [facility] to see about getting scheduled. Phone#[phone]

## 2023-11-10 NOTE — Telephone Encounter (Signed)
 Pt called stating that she is needing a referral for Dry Needling at [facility] sent to fax number [phone]

## 2023-11-14 NOTE — Telephone Encounter (Signed)
 LVM for pt to call office. Please relay message below if she calls.

## 2023-11-15 NOTE — Telephone Encounter (Signed)
 LVM for pt to call.

## 2023-11-17 ENCOUNTER — Other Ambulatory Visit (HOSPITAL_COMMUNITY): Payer: Self-pay

## 2023-11-17 NOTE — Telephone Encounter (Signed)
   No PA Needed,

## 2023-11-22 ENCOUNTER — Other Ambulatory Visit: Payer: Self-pay | Admitting: Neurology

## 2023-11-22 NOTE — Telephone Encounter (Signed)
 I called patient she reports that she is aware that the max is 4 per week. Pt does need refill.  Pt said sometimes she needs to use 2 extra pills for a total of 6 per week maybe once or twice the week for headaches when needed.      Pt also mentioned her flexeril  5 mg is not covered by insurance. I explained that PA was approved in Feb 2025 and she should be able to get refill if needed.

## 2023-11-22 NOTE — Telephone Encounter (Signed)
 Last seen on 06/02/23 No follow up scheduled    Dispensed Days Supply Quantity Provider Pharmacy  BUTALB-ACETAMIN-[facility]FF [phone] 10/29/2023 28 16 each Sater, Sherida Dimmer, MD [facility] [phone] [facility] [address]...     Rx pending to be signed

## 2023-12-06 DIAGNOSIS — I10 Essential (primary) hypertension: Secondary | ICD-10-CM | POA: Diagnosis not present

## 2023-12-06 DIAGNOSIS — M810 Age-related osteoporosis without current pathological fracture: Secondary | ICD-10-CM | POA: Diagnosis not present

## 2023-12-26 ENCOUNTER — Other Ambulatory Visit: Payer: Self-pay | Admitting: Gastroenterology

## 2023-12-27 ENCOUNTER — Other Ambulatory Visit (HOSPITAL_COMMUNITY): Payer: Self-pay | Admitting: *Deleted

## 2023-12-29 ENCOUNTER — Ambulatory Visit (HOSPITAL_COMMUNITY)
Admission: RE | Admit: 2023-12-29 | Discharge: 2023-12-29 | Disposition: A | Discharge status: Home / Self Care | Source: Ambulatory Visit | Attending: Internal Medicine | Admitting: Internal Medicine

## 2023-12-29 DIAGNOSIS — M810 Age-related osteoporosis without current pathological fracture: Secondary | ICD-10-CM | POA: Insufficient documentation

## 2023-12-29 MED ORDER — ROMOSOZUMAB-AQQG 105 MG/1.17ML ~~LOC~~ SOSY
210.0000 mg | PREFILLED_SYRINGE | Freq: Once | SUBCUTANEOUS | Status: DC
  Administered 2023-12-29: 210 mg via SUBCUTANEOUS
  Filled 2023-12-29: qty 2.4

## 2024-01-07 ENCOUNTER — Other Ambulatory Visit: Payer: Self-pay | Admitting: Family Medicine

## 2024-01-09 DIAGNOSIS — Z681 Body mass index (BMI) 19 or less, adult: Secondary | ICD-10-CM | POA: Diagnosis not present

## 2024-01-09 DIAGNOSIS — Z124 Encounter for screening for malignant neoplasm of cervix: Secondary | ICD-10-CM | POA: Diagnosis not present

## 2024-01-09 DIAGNOSIS — Z1231 Encounter for screening mammogram for malignant neoplasm of breast: Secondary | ICD-10-CM | POA: Diagnosis not present

## 2024-01-30 ENCOUNTER — Encounter (HOSPITAL_COMMUNITY)

## 2024-01-31 ENCOUNTER — Telehealth (HOSPITAL_COMMUNITY): Payer: Self-pay

## 2024-01-31 NOTE — Telephone Encounter (Signed)
 Auth Submission: NO AUTH NEEDED Site of care: Site of care: [facility] Payer: Medicare A/B, [employer] supp Medication & CPT/J Code(s) submitted: Evenity  (Romosozumab ) J3111 Diagnosis Code:  Route of submission (phone, fax, portal):  Phone # Fax # Auth type: Buy/Bill HB Units/visits requested: 210mg  x 12 doses Reference number:  Approval from: 01/31/24 to 08/18/24  Medicare A/B will cover 80%. [employer] supplement will cover remaining 20%.

## 2024-02-02 ENCOUNTER — Ambulatory Visit (HOSPITAL_COMMUNITY)
Admission: RE | Admit: 2024-02-02 | Discharge: 2024-02-02 | Disposition: A | Discharge status: Home / Self Care | Source: Ambulatory Visit | Attending: Internal Medicine

## 2024-02-02 DIAGNOSIS — M810 Age-related osteoporosis without current pathological fracture: Secondary | ICD-10-CM | POA: Insufficient documentation

## 2024-02-02 MED ORDER — ROMOSOZUMAB-AQQG 105 MG/1.17ML ~~LOC~~ SOSY
210.0000 mg | PREFILLED_SYRINGE | Freq: Once | SUBCUTANEOUS | Status: AC
  Administered 2024-02-02: 210 mg via SUBCUTANEOUS
  Filled 2024-02-02: qty 2.4

## 2024-02-09 ENCOUNTER — Other Ambulatory Visit: Payer: Self-pay | Admitting: *Deleted

## 2024-02-09 NOTE — Telephone Encounter (Signed)
 Last seen on 06/02/23 No 4 months follow up scheduled   Dispensed Days Supply Quantity Provider Pharmacy  BUTALB-ACETAMIN-CAFF [phone] 02/01/2024 28 16 each Lomax, Amy, NP [facility] [phone] - [address]...   Too soon to refill

## 2024-02-15 ENCOUNTER — Other Ambulatory Visit: Payer: Self-pay

## 2024-02-16 DIAGNOSIS — N1831 Chronic kidney disease, stage 3a: Secondary | ICD-10-CM | POA: Diagnosis not present

## 2024-02-16 DIAGNOSIS — I129 Hypertensive chronic kidney disease with stage 1 through stage 4 chronic kidney disease, or unspecified chronic kidney disease: Secondary | ICD-10-CM | POA: Diagnosis not present

## 2024-02-16 DIAGNOSIS — Z87442 Personal history of urinary calculi: Secondary | ICD-10-CM | POA: Diagnosis not present

## 2024-02-21 DIAGNOSIS — N182 Chronic kidney disease, stage 2 (mild): Secondary | ICD-10-CM | POA: Diagnosis not present

## 2024-02-21 DIAGNOSIS — I10 Essential (primary) hypertension: Secondary | ICD-10-CM | POA: Diagnosis not present

## 2024-02-21 DIAGNOSIS — Z87442 Personal history of urinary calculi: Secondary | ICD-10-CM | POA: Diagnosis not present

## 2024-02-21 DIAGNOSIS — I129 Hypertensive chronic kidney disease with stage 1 through stage 4 chronic kidney disease, or unspecified chronic kidney disease: Secondary | ICD-10-CM | POA: Diagnosis not present

## 2024-03-05 ENCOUNTER — Encounter (HOSPITAL_COMMUNITY)

## 2024-03-05 ENCOUNTER — Other Ambulatory Visit: Payer: Self-pay

## 2024-03-05 DIAGNOSIS — G43009 Migraine without aura, not intractable, without status migrainosus: Secondary | ICD-10-CM

## 2024-03-05 MED ORDER — ONDANSETRON HCL 4 MG PO TABS: 4.0000 mg | ORAL_TABLET | Freq: Three times a day (TID) | ORAL | 1 refills | Status: DC | PRN

## 2024-03-05 NOTE — Telephone Encounter (Signed)
 Last Seen 06/02/2023 No Upcoming Appointment  Butalb-Acetamin-Caff 50-325-40 Last Filled 02/01/2024

## 2024-03-06 MED ORDER — BUTALBITAL-APAP-CAFFEINE 50-325-40 MG PO TABS: ORAL_TABLET | ORAL | 0 refills | Status: DC

## 2024-03-08 ENCOUNTER — Encounter (HOSPITAL_COMMUNITY)

## 2024-03-13 ENCOUNTER — Encounter (HOSPITAL_COMMUNITY)

## 2024-03-20 ENCOUNTER — Encounter: Payer: Self-pay | Admitting: *Deleted

## 2024-04-03 ENCOUNTER — Other Ambulatory Visit: Payer: Self-pay

## 2024-04-03 NOTE — Progress Notes (Unsigned)
 Last Seen 06/02/2023 No Upcoming Appointment   Butalb-Acetamin-Caffeine  (Fioricet) last filled 03/06/24

## 2024-04-04 MED ORDER — BUTALBITAL-APAP-CAFFEINE 50-325-40 MG PO TABS: ORAL_TABLET | ORAL | 0 refills | Status: DC

## 2024-04-09 ENCOUNTER — Other Ambulatory Visit (HOSPITAL_COMMUNITY): Payer: Self-pay | Admitting: Internal Medicine

## 2024-04-09 DIAGNOSIS — H6123 Impacted cerumen, bilateral: Secondary | ICD-10-CM | POA: Diagnosis not present

## 2024-04-09 DIAGNOSIS — Z23 Encounter for immunization: Secondary | ICD-10-CM | POA: Diagnosis not present

## 2024-04-09 DIAGNOSIS — H900 Conductive hearing loss, bilateral: Secondary | ICD-10-CM | POA: Diagnosis not present

## 2024-04-13 ENCOUNTER — Inpatient Hospital Stay (HOSPITAL_COMMUNITY): Admission: RE | Admit: 2024-04-13 | Source: Ambulatory Visit

## 2024-04-19 ENCOUNTER — Ambulatory Visit (HOSPITAL_COMMUNITY)
Admission: RE | Admit: 2024-04-19 | Discharge: 2024-04-19 | Disposition: A | Discharge status: Home / Self Care | Source: Ambulatory Visit | Attending: Internal Medicine | Admitting: Internal Medicine

## 2024-04-19 VITALS — BP 132/78 | HR 111 | Temp 97.5°F | Resp 16

## 2024-04-19 DIAGNOSIS — M810 Age-related osteoporosis without current pathological fracture: Secondary | ICD-10-CM | POA: Diagnosis not present

## 2024-04-19 MED ORDER — ROMOSOZUMAB-AQQG 105 MG/1.17ML ~~LOC~~ SOSY
210.0000 mg | PREFILLED_SYRINGE | Freq: Once | SUBCUTANEOUS | Status: AC
  Administered 2024-04-19: 210 mg via SUBCUTANEOUS

## 2024-04-19 MED ORDER — ROMOSOZUMAB-AQQG 105 MG/1.17ML ~~LOC~~ SOSY
PREFILLED_SYRINGE | SUBCUTANEOUS | Status: AC
  Filled 2024-04-19: qty 1.17

## 2024-05-21 ENCOUNTER — Encounter (HOSPITAL_COMMUNITY)

## 2024-05-24 ENCOUNTER — Ambulatory Visit (HOSPITAL_COMMUNITY)
Admission: RE | Admit: 2024-05-24 | Discharge: 2024-05-24 | Disposition: A | Discharge status: Home / Self Care | Source: Ambulatory Visit | Attending: Internal Medicine | Admitting: Internal Medicine

## 2024-05-24 VITALS — BP 114/74 | HR 100 | Temp 97.3°F | Resp 16

## 2024-05-24 DIAGNOSIS — M810 Age-related osteoporosis without current pathological fracture: Secondary | ICD-10-CM | POA: Insufficient documentation

## 2024-05-24 MED ORDER — ROMOSOZUMAB-AQQG 105 MG/1.17ML ~~LOC~~ SOSY
210.0000 mg | PREFILLED_SYRINGE | Freq: Once | SUBCUTANEOUS | Status: AC
  Administered 2024-05-24: 210 mg via SUBCUTANEOUS

## 2024-05-24 MED ORDER — ROMOSOZUMAB-AQQG 105 MG/1.17ML ~~LOC~~ SOSY
PREFILLED_SYRINGE | SUBCUTANEOUS | Status: AC
  Filled 2024-05-24: qty 1.17

## 2024-05-31 ENCOUNTER — Other Ambulatory Visit: Payer: Self-pay | Admitting: Gastroenterology

## 2024-05-31 ENCOUNTER — Telehealth: Payer: Self-pay | Admitting: Family Medicine

## 2024-05-31 DIAGNOSIS — K222 Esophageal obstruction: Secondary | ICD-10-CM

## 2024-05-31 DIAGNOSIS — R1310 Dysphagia, unspecified: Secondary | ICD-10-CM

## 2024-05-31 NOTE — Telephone Encounter (Signed)
 1 yr f/u made

## 2024-06-01 ENCOUNTER — Ambulatory Visit (INDEPENDENT_AMBULATORY_CARE_PROVIDER_SITE_OTHER): Admitting: Family Medicine

## 2024-06-01 ENCOUNTER — Encounter: Payer: Self-pay | Admitting: Family Medicine

## 2024-06-01 VITALS — BP 130/80 | HR 86 | Ht 62.0 in | Wt 96.0 lb

## 2024-06-01 DIAGNOSIS — M47812 Spondylosis without myelopathy or radiculopathy, cervical region: Secondary | ICD-10-CM

## 2024-06-01 DIAGNOSIS — G44219 Episodic tension-type headache, not intractable: Secondary | ICD-10-CM

## 2024-06-01 DIAGNOSIS — Z9889 Other specified postprocedural states: Secondary | ICD-10-CM

## 2024-06-01 DIAGNOSIS — M542 Cervicalgia: Secondary | ICD-10-CM

## 2024-06-01 DIAGNOSIS — G43009 Migraine without aura, not intractable, without status migrainosus: Secondary | ICD-10-CM

## 2024-06-01 DIAGNOSIS — G8928 Other chronic postprocedural pain: Secondary | ICD-10-CM | POA: Diagnosis not present

## 2024-06-01 MED ORDER — SUMATRIPTAN SUCCINATE 100 MG PO TABS: ORAL_TABLET | ORAL | 11 refills | Status: AC

## 2024-06-01 MED ORDER — CYCLOBENZAPRINE HCL 5 MG PO TABS: 5.0000 mg | ORAL_TABLET | Freq: Two times a day (BID) | ORAL | 1 refills | Status: AC | PRN

## 2024-06-01 MED ORDER — IMIPRAMINE HCL 25 MG PO TABS: 75.0000 mg | ORAL_TABLET | Freq: Every day | ORAL | 3 refills | Status: AC

## 2024-06-01 MED ORDER — LEVETIRACETAM 500 MG PO TABS: 500.0000 mg | ORAL_TABLET | Freq: Two times a day (BID) | ORAL | 3 refills | Status: AC

## 2024-06-01 MED ORDER — BUTALBITAL-APAP-CAFFEINE 50-325-40 MG PO TABS: ORAL_TABLET | ORAL | 0 refills | Status: DC

## 2024-06-01 MED ORDER — ONDANSETRON HCL 4 MG PO TABS: 4.0000 mg | ORAL_TABLET | Freq: Three times a day (TID) | ORAL | 1 refills | Status: AC | PRN

## 2024-06-01 MED ORDER — QULIPTA 60 MG PO TABS: 60.0000 mg | ORAL_TABLET | Freq: Every day | ORAL | 3 refills | Status: DC

## 2024-06-01 NOTE — Patient Instructions (Signed)
 Below is our plan:  We will continue current treatment plan. Add Qulipta 60mg  daily. Try to avoid taking butalbital  and Excedrin more than 1-2 times a week.   Please make sure you are staying well hydrated. I recommend 50-60 ounces daily. Well balanced diet and regular exercise encouraged. Consistent sleep schedule with 6-8 hours recommended.   Please continue follow up with care team as directed.   Follow up with me in 1 year   You may receive a survey regarding today's visit. I encourage you to leave honest feed back as I do use this information to improve patient care. Thank you for seeing me today!   GENERAL HEADACHE INFORMATION:   Natural supplements: Magnesium  Oxide or Magnesium  Glycinate 500 mg at bed (up to 800 mg daily) Coenzyme Q10 300 mg in AM Vitamin B2- 200 mg twice a day   Add 1 supplement at a time since even natural supplements can have undesirable side effects. You can sometimes buy supplements cheaper (especially Coenzyme Q10) at www.webmailguide.co.za or at [facility].  Migraine with aura: There is increased risk for stroke in women with migraine with aura and a contraindication for the combined contraceptive pill for use by women who have migraine with aura. The risk for women with migraine without aura is lower. However other risk factors like smoking are far more likely to increase stroke risk than migraine. There is a recommendation for no smoking and for the use of OCPs without estrogen such as progestogen only pills particularly for women with migraine with aura.SABRA People who have migraine headaches with auras may be 3 times more likely to have a stroke caused by a blood clot, compared to migraine patients who don't see auras. Women who take hormone-replacement therapy may be 30 percent more likely to suffer a clot-based stroke than women not taking medication containing estrogen. Other risk factors like smoking and high blood pressure may be  much more important.    Vitamins and  herbs that show potential:   Magnesium : Magnesium  (250 mg twice a day or 500 mg at bed) has a relaxant effect on smooth muscles such as blood vessels. Individuals suffering from frequent or daily headache usually have low magnesium  levels which can be increase with daily supplementation of 400-750 mg. Three trials found 40-90% average headache reduction  when used as a preventative. Magnesium  may help with headaches are aura, the best evidence for magnesium  is for migraine with aura is its thought to stop the cortical spreading depression we believe is the pathophysiology of migraine aura.Magnesium  also demonstrated the benefit in menstrually related migraine.  Magnesium  is part of the messenger system in the serotonin cascade and it is a good muscle relaxant.  It is also useful for constipation which can be a side effect of other medications used to treat migraine. Good sources include nuts, whole grains, and tomatoes. Side Effects: loose stool/diarrhea  Riboflavin (vitamin B 2) 200 mg twice a day. This vitamin assists nerve cells in the production of ATP a principal energy storing molecule.  It is necessary for many chemical reactions in the body.  There have been at least 3 clinical trials of riboflavin using 400 mg per day all of which suggested that migraine frequency can be decreased.  All 3 trials showed significant improvement in over half of migraine sufferers.  The supplement is found in bread, cereal, milk, meat, and poultry.  Most Americans get more riboflavin than the recommended daily allowance, however riboflavin deficiency is not necessary for  the supplements to help prevent headache. Side effects: energizing, green urine   Coenzyme Q10: This is present in almost all cells in the body and is critical component for the conversion of energy.  Recent studies have shown that a nutritional supplement of CoQ10 can reduce the frequency of migraine attacks by improving the energy production of cells as  with riboflavin.  Doses of 150 mg twice a day have been shown to be effective.   Melatonin: Increasing evidence shows correlation between melatonin secretion and headache conditions.  Melatonin supplementation has decreased headache intensity and duration.  It is widely used as a sleep aid.  Sleep is natures way of dealing with migraine.  A dose of 3 mg is recommended to start for headaches including cluster headache. Higher doses up to 15 mg has been reviewed for use in Cluster headache and have been used. The rationale behind using melatonin for cluster is that many theories regarding the cause of Cluster headache center around the disruption of the normal circadian rhythm in the brain.  This helps restore the normal circadian rhythm.   HEADACHE DIET: Foods and beverages which may trigger migraine Note that only 20% of headache patients are food sensitive. You will know if you are food sensitive if you get a headache consistently 20 minutes to 2 hours after eating a certain food. Only cut out a food if it causes headaches, otherwise you might remove foods you enjoy! What matters most for diet is to eat a well balanced healthy diet full of vegetables and low fat protein, and to not miss meals.   Chocolate, other sweets ALL cheeses except cottage and cream cheese Dairy products, yogurt, sour cream, ice cream Liver Meat extracts (Bovril, Marmite, meat tenderizers) Meats or fish which have undergone aging, fermenting, pickling or smoking. These include: Hotdogs,salami,Lox,sausage, mortadellas,smoked salmon, pepperoni, Pickled herring Pods of broad bean (English beans, Chinese pea pods, Italian (fava) beans, lima and navy beans Ripe avocado, ripe banana Yeast extracts or active yeast preparations such as Brewer's or Fleishman's (commercial bakes goods are permitted) Tomato based foods, pizza (lasagna, etc.)   MSG (monosodium glutamate) is disguised as many things; look for these common  aliases: Monopotassium glutamate Autolysed yeast Hydrolysed protein Sodium caseinate "flavorings" "all natural preservatives Nutrasweet   Avoid all other foods that convincingly provoke headaches.   Resources: The Dizzy Bluford Aid Your Headache Diet, migrainestrong.com  https://zamora-andrews.com/   Caffeine  and Migraine For patients that have migraine, caffeine  intake more than 3 days per week can lead to dependency and increased migraine frequency. I would recommend cutting back on your caffeine  intake as best you can. The recommended amount of caffeine  is 200-300 mg daily, although migraine patients may experience dependency at even lower doses. While you may notice an increase in headache temporarily, cutting back will be helpful for headaches in the long run. For more information on caffeine  and migraine, visit: https://americanmigrainefoundation.org/resource-library/caffeine -and-migraine/   Headache Prevention Strategies:   1. Maintain a headache diary; learn to identify and avoid triggers.  - This can be a simple note where you log when you had a headache, associated symptoms, and medications used - There are several smartphone apps developed to help track migraines: Migraine Buddy, Migraine Monitor, Curelator N1-Headache App   Common triggers include: Emotional triggers: Emotional/Upset family or friends Emotional/Upset occupation Business reversal/success Anticipation anxiety Crisis-serious Post-crisis periodNew job/position   Physical triggers: Vacation Day Weekend Strenuous Exercise High Altitude Location New Move Menstrual Day Physical Illness Oversleep/Not enough sleep Weather changes  Light: Photophobia or light sesnitivity treatment involves a balance between desensitization and reduction in overly strong input. Use dark polarized glasses outside, but not inside. Avoid bright or fluorescent light, but do not dim  environment to the point that going into a normally lit room hurts. Consider FL-41 tint lenses, which reduce the most irritating wavelengths without blocking too much light.  These can be obtained at axonoptics.com or theraspecs.com Foods: see list above.   2. Limit use of acute treatments (over-the-counter medications, triptans, etc.) to no more than 2 days per week or 10 days per month to prevent medication overuse headache (rebound headache).     3. Follow a regular schedule (including weekends and holidays): Don't skip meals. Eat a balanced diet. 8 hours of sleep nightly. Minimize stress. Exercise 30 minutes per day. Being overweight is associated with a 5 times increased risk of chronic migraine. Keep well hydrated and drink 6-8 glasses of water  per day.   4. Initiate non-pharmacologic measures at the earliest onset of your headache. Rest and quiet environment. Relax and reduce stress. Breathe2Relax is a free app that can instruct you on    some simple relaxtion and breathing techniques. Http://Dawnbuse.com is a    free website that provides teaching videos on relaxation.  Also, there are  many apps that   can be downloaded for "mindful" relaxation.  An app called YOGA NIDRA will help walk you through mindfulness. Another app called Calm can be downloaded to give you a structured mindfulness guide with daily reminders and skill development. Headspace for guided meditation Mindfulness Based Stress Reduction Online Course: www.palousemindfulness.com Cold compresses.   5. Don't wait!! Take the maximum allowable dosage of prescribed medication at the first sign of migraine.   6. Compliance:  Take prescribed medication regularly as directed and at the first sign of a migraine.   7. Communicate:  Call your physician when problems arise, especially if your headaches change, increase in frequency/severity, or become associated with neurological symptoms (weakness, numbness, slurred speech, etc.).  Proceed to emergency room if you experience new or worsening symptoms or symptoms do not resolve, if you have new neurologic symptoms or if headache is severe, or for any concerning symptom.   8. Headache/pain management therapies: Consider various complementary methods, including medication, behavioral therapy, psychological counselling, biofeedback, massage therapy, acupuncture, dry needling, and other modalities.  Such measures may reduce the need for medications. Counseling for pain management, where patients learn to function and ignore/minimize their pain, seems to work very well.   9. Recommend changing family's attention and focus away from patient's headaches. Instead, emphasize daily activities. If first question of day is 'How are your headaches/Do you have a headache today?', then patient will constantly think about headaches, thus making them worse. Goal is to re-direct attention away from headaches, toward daily activities and other distractions.   10. Helpful Websites: www.AmericanHeadacheSociety.org patenthood.ch www.headaches.org tightmarket.nl www.achenet.org

## 2024-06-01 NOTE — Progress Notes (Signed)
 Chief Complaint  Patient presents with   Follow-up    Migraine  pt stated--headache 6 level pain. RM 2, alone    HISTORY OF PRESENT ILLNESS:  06/01/24 ALL:  Teresa Lozano returns for follow up for migraines. She was last seen 05/2023 and doing well. She reports increased intensity and frequency of headaches since starting Evenity  injections for osteoporosis. Reclast  was not effective. She now has daily headaches. Migraines remain well managed. Maybe 1-2 per month. She is taking Excedrin and butalbital  2-3 times a week. She has not taken cyclobenzaprine  recently due to insurance not covering it. She is willing to pay cash. She continues to have significant neck pain.   06/02/2023 ALL:  Teresa Lozano returns for follow up for headaches. She was last seen 08/2022 and doing fairly well on imipramine  75mg  daily and levetiracetam  500mg  BID. Butalbital , sumatriptan  and cyclobenzaprine  continued as needed. She reports migraines are well managed. She may have one migraine a month. She is having daily headaches that she feels stems from chronic neck pain. She describes these as tension style headaches in the occipital area of her head and base of neck. She takes Tylenol  1000mg  every morning. She has been using cyclobenzaprine  5mg  at bedtime, every night. She will take another cyclobenzaprine  in the mornings, on occasion. She can't take Nsaids d/t remote history of stomach ulcers. She has failed gabapentin . ACDF with Dr Joshua in 2011. No radicular symptoms.   09/01/2022 ALL:  Teresa Lozano returns for follow up for headaches. She was last seen 03/2022 and continued to have regular tension style headaches and neck pain. We continued imipramine  75mg  QHS and levetiracetam  500mg  BID. We added cyclobenzaprine  5mg  at bedtime. Butalbital , sumatriptan  and ondansetron  continued as needed. She called 05/2022 and reported it was helping but requested to increase to BID. Since, she reports headaches are significantly better. She is tolerating  meds with no adverse effects. She also reports being diagnosed with anemia. She received two iron  transfusions and feels that this has helped. She is followed closely by GI and PCP. Now she estimates 4-8 headache days a month with 0-1 migraines per month. Taking butalbital  1-2 times a week. Sumatriptan  about once per month.   03/30/2022 ALL: Teresa Lozano returns for follow up for headaches. She was last seen 09/2021 and we continued imipramine  75mg  QHS and levetiracetam  500mg  BID but added gabapentin  300mg  QHS for continued tension style headaches. Since, she reports no improvement. She took gabapentin  for about 4 months but did not note any improvement in headaches or neck pain. She continues to have near daily headaches. She wakes with headaches 2-3 times a week. She has 2-3 migrainous headaches a month easily aborted with sumatriptan . Occasionally uses ondansetron  for nausea. She is taking butalbital  4 times a week and Excedrin often. She has difficulty staying asleep 3-4 days a week. She is unsure why she wakes up but is not able to go back to sleep. She does snore, worse on her back. She denies dry mouth or EDS. She denies family history of sleep apnea. She reports shooting pain in hands and feet had improved over the summer but are back. No significant worsening. No weakness.   Tried and failed: topiramate (contraindicated for kidney stones), propranolol  (edema), amitriptyline (ineffective), imipramine  (on now), levetiracetam  (on now), sumatriptan  (works well)  09/23/2021 ALL: Teresa Lozano returns for follow up for migraines. She continued levetiracetam  and imipramine  and added Emgality  at last visit 03/2021. Injections were approved but copay was 500. She feels migraines are very well  managed. She uses sumatriptan  rarely but feels it is helpful. She continues to have daily tension headaches. Pain starts in the neck. She is s/p cervical fusion. She is taking butalbital  about every other day.   03/26/2021 ALL:  Teresa Lozano  returns for follow up for headaches. She added propranolol  20mg  BID at last visit and continued imipramine  75mg  QHS and levetiracetam  500mg  BID. Sumatriptan  and butalbital  help with abortive therapy. She has about 8-12 headache days with 4-5 migraines per month. Sumatriptan  usually only helps with retro orbital migraine and she uses  Butalbital  helps with neck pain and tension headaches. She usually takes 4-5 sumatriptan  and 16 butalbital  every month.   Tried and failed: topiramate (contraindicated for kidney stones), propranolol  (edema), amitriptyline (ineffective), imipramine  (on now), levetiracetam  (on now)  09/23/2020 ALL:  Teresa Lozano is a 73 y.o. female here today for follow up for headaches. She continues imipramine  75mg  QHS and levetiracetam  500mg  BID for tension style and migraine prevention. She rarely takes Sumatriptan  but it works for migraine abortion and Fioricet for tension headaches. She is having to take at least 3-4 doses every week. Medications work fairly well. She feels neck pain is stable. She has noted a higher heart rate for the past year. She denies chest pain or palpitations. No dizziness or lightheadedness. She does feel winded with working in her yard. She denies history of asthma. She does not drink much water  but does like tea.    HISTORY (copied from Dr Duncan previous note)  Teresa Lozano is a 73 y.o. woman who has had headaches for 40 years.     Update 03/26/2020: She notes migraine headaches once a month but tension headaches are more frequently -once a week.   Most headaches are occipital and radiate forward.    She continues on imipramine  75 mg nightly and Keppra  500 mg p.o. twice daily.    She tolerates the medications well.   For migraines she takes sumatriptan  and usually has a benefit.  For occipitla headaches she takes fioricet or  OTC Excedrin, often with benefit.     She had kidney stones so can't use topiramate or zonisamide.    She notes shooting pain in  her fingers and toes.   This is better than last year.    Hand numbness often involves the ring fingers.     The 4th and 5th toes are more involved than the rest of the foot.   She has pain left > right.   NCV/EMG was normal last year.   She does not note weakness in the hand hands or feet.  She has some neck pain and back pain.   DATA CT has shown severe left facet changes at [facility] in 2011 and much milder facet hypertrophy to the right at [facility]. She has ACDF at [facility] (Dr. Joshua 2011)   REVIEW OF SYSTEMS: Out of a complete 14 system review of symptoms, the patient complains only of the following symptoms, headaches, neck pain, elevated heart rate and all other reviewed systems are negative.   ALLERGIES: Allergies  Allergen Reactions   Lisinopril  Other (Teresa Lozano Comments) and Cough    Coughing all  day/night per patient   Robaxin  [Methocarbamol ] Nausea And Vomiting     HOME MEDICATIONS: Outpatient Medications Prior to Visit  Medication Sig Dispense Refill   amLODipine  (NORVASC ) 10 MG tablet Take 10 mg by mouth daily.     amoxicillin -clavulanate (AUGMENTIN) 875-125 MG tablet Take 1 tablet by mouth 2 (two) times daily.  cetirizine (ZYRTEC) 10 MG tablet Take 10 mg by mouth daily.     Cholecalciferol (VITAMIN D3) 125 MCG (5000 UT) CAPS Take 5,000 Units by mouth daily.     CRANBERRY PO Take 650 mg by mouth daily.     ferrous sulfate  325 (65 FE) MG tablet TAKE 1 TABLET BY MOUTH EVERY DAY WITH BREAKFAST 90 tablet 1   Magnesium  500 MG CAPS Take 500 mg by mouth in the morning.     Multiple Vitamin (MULTI-VITAMIN DAILY PO) Take 1 tablet by mouth daily with breakfast.     omeprazole  (PRILOSEC) 40 MG capsule TAKE 1 CAPSULE BY MOUTH TWICE A DAY 180 capsule 1   polyethylene glycol (MIRALAX  / GLYCOLAX ) packet Take 17 g by mouth daily.     Romosozumab -aqqg (EVENITY ) 105 MG/1.17ML SOSY injection Inject 210 mg into the skin.     rosuvastatin  (CRESTOR ) 10 MG tablet Take 10 mg by mouth daily.     sucralfate   (CARAFATE ) 1 g tablet Take 1 tablet (1 g total) by mouth as needed. (Patient taking differently: Take 1 g by mouth daily as needed (for GERD-like symptoms).)     butalbital -acetaminophen -caffeine  (FIORICET) 50-325-40 MG tablet One to two tablets by mouth as needed for headache. No more than 4/week 16 tablet 0   cyclobenzaprine  (FLEXERIL ) 5 MG tablet Take 1 tablet (5 mg total) by mouth 2 (two) times daily as needed for muscle spasms. 180 tablet 3   imipramine  (TOFRANIL ) 25 MG tablet Take 3 tablets (75 mg total) by mouth at bedtime. 270 tablet 3   levETIRAcetam  (KEPPRA ) 500 MG tablet Take 1 tablet (500 mg total) by mouth 2 (two) times daily. 180 tablet 3   ondansetron  (ZOFRAN ) 4 MG tablet Take 1 tablet (4 mg total) by mouth every 8 (eight) hours as needed for nausea or vomiting. 20 tablet 1   RECLAST  5 MG/100ML SOLN injection Inject 5 mg into the vein once.     SUMAtriptan  (IMITREX ) 100 MG tablet TAKE 1 TAB AS NEEDED FOR MIGRAINE. MAY REPEAT IN 2 HRS IF HEADACHE PERSISTS OR RECURS. (Patient taking differently: Take 100 mg by mouth Teresa Lozano admin instructions. TAKE 100 MG BY MOUTH AS NEEDED FOR MIGRAINE. MAY REPEAT IN 2 HRS IF HEADACHE PERSISTS OR RECURS.) 10 tablet 11   No facility-administered medications prior to visit.     PAST MEDICAL HISTORY: Past Medical History:  Diagnosis Date   Allergy    Arthritis    Chicken pox    Frequent headaches    GERD (gastroesophageal reflux disease)    History of kidney stones    Hyperlipidemia    Hypertension    IDA (iron  deficiency anemia)    Kidney stone on left side    Osteoporosis      PAST SURGICAL HISTORY: Past Surgical History:  Procedure Laterality Date   CERVICAL FUSION  2000   COLONOSCOPY  01/18/2019   COLONOSCOPY WITH PROPOFOL   08/23/2022   CYSTOSCOPY/RETROGRADE/URETEROSCOPY Left 07/27/2023   Procedure: CYSTOSCOPY/RETROGRADE/ LEFT STENT PLACEMENT;  Surgeon: Shona Layman BROCKS, MD;  Location: [facility];  Service: Urology;  Laterality: Left;    ESOPHAGOGASTRODUODENOSCOPY  11/2016   ESOPHAGOGASTRODUODENOSCOPY (EGD) WITH PROPOFOL  N/A 12/28/2016   Procedure: ESOPHAGOGASTRODUODENOSCOPY (EGD) WITH PROPOFOL ;  Surgeon: Leigh Elspeth Mt, MD;  Location: [facility];  Service: Gastroenterology;  Laterality: N/A;   EYE SURGERY     STOMACH SURGERY  04/2017   REMOVED ULCER   TONSILLECTOMY     UPPER GASTROINTESTINAL ENDOSCOPY  07/2022     FAMILY  HISTORY: Family History  Problem Relation Age of Onset   Hyperlipidemia Mother    Hypertension Mother    Prostate cancer Father    Hyperlipidemia Father    Heart disease Father    Heart disease Brother    Hypertension Sister    Esophageal cancer Sister    Hypertension Maternal Grandmother    Colon cancer Neg Hx    Colon polyps Neg Hx    Stomach cancer Neg Hx    Rectal cancer Neg Hx      SOCIAL HISTORY: Social History   Socioeconomic History   Marital status: Widowed    Spouse name: Not on file   Number of children: Not on file   Years of education: Not on file   Highest education level: Not on file  Occupational History   Not on file  Tobacco Use   Smoking status: Never   Smokeless tobacco: Never  Vaping Use   Vaping status: Never Used  Substance and Sexual Activity   Alcohol  use: No    Alcohol /week: 0.0 standard drinks of alcohol    Drug use: No   Sexual activity: Not Currently  Other Topics Concern   Not on file  Social History Narrative   Not on file   Social Drivers of Health   Financial Resource Strain: Not on file  Food Insecurity: No Food Insecurity (07/27/2023)   Hunger Vital Sign    Worried About Running Out of Food in the Last Year: Never true    Ran Out of Food in the Last Year: Never true  Transportation Needs: No Transportation Needs (07/27/2023)   PRAPARE - Administrator, Civil Service (Medical): No    Lack of Transportation (Non-Medical): No  Physical Activity: Not on file  Stress: Not on file  Social Connections: Unknown  (07/27/2023)   Social Connection and Isolation Panel    Frequency of Communication with Friends and Family: More than three times a week    Frequency of Social Gatherings with Friends and Family: Twice a week    Attends Religious Services: 1 to 4 times per year    Active Member of [employer] or Organizations: Yes    Attends Banker Meetings: 1 to 4 times per year    Marital Status: Not on file  Intimate Partner Violence: Patient Declined (07/27/2023)   Humiliation, Afraid, Rape, and Kick questionnaire    Fear of Current or Ex-Partner: Patient declined    Emotionally Abused: Patient declined    Physically Abused: Patient declined    Sexually Abused: Patient declined      PHYSICAL EXAM  Vitals:   06/01/24 1050  BP: 130/80  Pulse: 86  SpO2: 100%  Weight: 96 lb (43.5 kg)  Height: 5' 2 (1.575 m)        Body mass index is 17.56 kg/m.   Generalized: Well developed, in no acute distress  Cardiology: normal rate and rhythm, no murmur auscultated  Respiratory: clear to auscultation bilaterally    Neurological examination  Mentation: Alert oriented to time, place, history taking. Follows all commands speech and language fluent Cranial nerve II-XII: Pupils were equal round reactive to light. Extraocular movements were full, visual field were full on confrontational test. Facial sensation and strength were normal. Head turning and shoulder shrug  were normal and symmetric. Motor: The motor testing reveals 5 over 5 strength of all 4 extremities. Good symmetric motor tone is noted throughout.  Gait and station: Gait is normal.  DTR: symmetric and  normal bilaterally     DIAGNOSTIC DATA (LABS, IMAGING, TESTING) - I reviewed patient records, labs, notes, testing and imaging myself where available.  Lab Results  Component Value Date   WBC 10.1 07/28/2023   HGB 13.4 09/01/2023   HCT 36.9 07/28/2023   MCV 96.6 07/28/2023   PLT 267 07/28/2023      Component Value  Date/Time   NA 138 07/28/2023 1045   K 3.5 07/28/2023 1045   CL 102 07/28/2023 1045   CO2 27 07/28/2023 1045   GLUCOSE 116 (H) 07/28/2023 1045   BUN 16 07/28/2023 1045   CREATININE 0.59 07/28/2023 1045   CALCIUM  8.3 (L) 07/28/2023 1045   PROT 7.3 07/26/2023 2021   ALBUMIN 3.1 (L) 07/26/2023 2021   AST 15 07/26/2023 2021   ALT 12 07/26/2023 2021   ALKPHOS 81 07/26/2023 2021   BILITOT 0.8 07/26/2023 2021   GFRNONAA >60 07/28/2023 1045   GFRAA 47 (L) 03/20/2020 1021   Lab Results  Component Value Date   CHOL 168 04/05/2019   HDL 55.10 04/05/2019   LDLCALC 85 04/05/2019   TRIG 140.0 04/05/2019   CHOLHDL 3 04/05/2019   Lab Results  Component Value Date   HGBA1C 5.2 12/24/2019   Lab Results  Component Value Date   VITAMINB12 703 07/02/2022   No results found for: TSH      No data to display               No data to display           ASSESSMENT AND PLAN  73 y.o. year old female  has a past medical history of Allergy, Arthritis, Chicken pox, Frequent headaches, GERD (gastroesophageal reflux disease), History of kidney stones, Hyperlipidemia, Hypertension, IDA (iron  deficiency anemia), Kidney stone on left side, and Osteoporosis. here with   Episodic tension-type headache, not intractable  Common migraine without intractability - Plan: ondansetron  (ZOFRAN ) 4 MG tablet  Chronic neck pain with history of cervical spinal surgery  Cervical facet joint syndrome  She is doing well, today. Headaches have increased due to osteoporosis treatment. We will add Qulipta 60mg  daily for prevention. She will continue imipramine  75mg  daily and levetiracetam  500mg  BID. She may continue butalbital , sumatriptan  and cyclobenzaprine  as needed. PDMP appropriate. Chronic neck pain has worsened. Healthy lifestyle habits encouraged. She will continue close follow up with PCP and care team. She will follow up in 4-5 months, sooner if needed.    No orders of the defined types were  placed in this encounter.    Meds ordered this encounter  Medications   SUMAtriptan  (IMITREX ) 100 MG tablet    Sig: TAKE 1 TAB AS NEEDED FOR MIGRAINE. MAY REPEAT IN 2 HRS IF HEADACHE PERSISTS OR RECURS.    Dispense:  10 tablet    Refill:  11    Supervising Provider:   YAN, YIJUN [[id number]]   ondansetron  (ZOFRAN ) 4 MG tablet    Sig: Take 1 tablet (4 mg total) by mouth every 8 (eight) hours as needed for nausea or vomiting.    Dispense:  20 tablet    Refill:  1    Pt needs to Make and Keep Appointment for Future Refill Request    Supervising Provider:   YAN, YIJUN [[id number]]   cyclobenzaprine  (FLEXERIL ) 5 MG tablet    Sig: Take 1 tablet (5 mg total) by mouth 2 (two) times daily as needed for muscle spasms.    Dispense:  90 tablet    Refill:  1    Supervising Provider:   YAN, YIJUN [[id number]]   butalbital -acetaminophen -caffeine  (FIORICET) 50-325-40 MG tablet    Sig: One to two tablets by mouth as needed for headache. No more than 4/week    Dispense:  16 tablet    Refill:  0    Pt Needs to Make and Keep Appointment for Future Refill Request    Supervising Provider:   YAN, YIJUN [[id number]]   imipramine  (TOFRANIL ) 25 MG tablet    Sig: Take 3 tablets (75 mg total) by mouth at bedtime.    Dispense:  270 tablet    Refill:  3    Supervising Provider:   YAN, YIJUN [[id number]]   levETIRAcetam  (KEPPRA ) 500 MG tablet    Sig: Take 1 tablet (500 mg total) by mouth 2 (two) times daily.    Dispense:  180 tablet    Refill:  3    Supervising Provider:   YAN, YIJUN [[id number]]   Atogepant (QULIPTA) 60 MG TABS    Sig: Take 1 tablet (60 mg total) by mouth daily.    Dispense:  90 tablet    Refill:  3    Supervising Provider:   YAN, YIJUN [[id number]]     Manasa Spease, MSN, FNP-C 06/01/2024, 11:12 AM  [facility] [address] [place], [place] [address] [phone]

## 2024-06-21 ENCOUNTER — Inpatient Hospital Stay (HOSPITAL_COMMUNITY): Admission: RE | Admit: 2024-06-21

## 2024-06-28 ENCOUNTER — Inpatient Hospital Stay (HOSPITAL_COMMUNITY): Admission: RE | Admit: 2024-06-28 | Discharge: 2024-06-28 | Attending: Internal Medicine

## 2024-06-28 VITALS — BP 120/68 | HR 75 | Temp 97.6°F | Resp 15

## 2024-06-28 DIAGNOSIS — M810 Age-related osteoporosis without current pathological fracture: Secondary | ICD-10-CM | POA: Diagnosis not present

## 2024-06-28 MED ORDER — ROMOSOZUMAB-AQQG 105 MG/1.17ML ~~LOC~~ SOSY
PREFILLED_SYRINGE | SUBCUTANEOUS | Status: AC
  Filled 2024-06-28: qty 1.17

## 2024-06-28 MED ORDER — ROMOSOZUMAB-AQQG 105 MG/1.17ML ~~LOC~~ SOSY
210.0000 mg | PREFILLED_SYRINGE | Freq: Once | SUBCUTANEOUS | Status: AC
  Administered 2024-06-28: 210 mg via SUBCUTANEOUS

## 2024-07-03 ENCOUNTER — Other Ambulatory Visit: Payer: Self-pay | Admitting: Family Medicine

## 2024-07-03 MED ORDER — BUTALBITAL-APAP-CAFFEINE 50-325-40 MG PO TABS: ORAL_TABLET | ORAL | 0 refills | Status: DC

## 2024-07-03 NOTE — Telephone Encounter (Signed)
 Pt called needing a refill on her butalbital -acetaminophen -caffeine  (FIORICET) 50-325-40 MG tablet  And is needing [facility]

## 2024-07-03 NOTE — Telephone Encounter (Signed)
 Last seen on 06/01/24 Follow up scheduled on 06/24/24   Dispensed Days Supply Quantity Provider Pharmacy  BUTALB-ACETAMIN-CAFF 50-325-40 06/01/2024 30 16 each Lomax, Amy, NP [phone]     Rx pending to be signed

## 2024-07-10 ENCOUNTER — Encounter (HOSPITAL_COMMUNITY): Payer: Self-pay | Admitting: Internal Medicine

## 2024-07-10 NOTE — Addendum Note (Signed)
 Addended by: DAYNE SHERRY RAMAN on: 07/10/2024 11:09 AM   Modules accepted: Orders

## 2024-07-20 ENCOUNTER — Encounter (HOSPITAL_COMMUNITY)

## 2024-07-26 ENCOUNTER — Ambulatory Visit (HOSPITAL_COMMUNITY)
Admission: RE | Admit: 2024-07-26 | Discharge: 2024-07-26 | Disposition: A | Discharge status: Home / Self Care | Source: Ambulatory Visit | Attending: Internal Medicine | Admitting: Internal Medicine

## 2024-07-26 VITALS — BP 133/79 | HR 74 | Temp 97.0°F | Resp 15

## 2024-07-26 DIAGNOSIS — M810 Age-related osteoporosis without current pathological fracture: Secondary | ICD-10-CM | POA: Diagnosis present

## 2024-07-26 MED ORDER — ROMOSOZUMAB-AQQG 105 MG/1.17ML ~~LOC~~ SOSY
PREFILLED_SYRINGE | SUBCUTANEOUS | Status: AC
  Filled 2024-07-26: qty 1.17

## 2024-07-26 MED ORDER — ROMOSOZUMAB-AQQG 105 MG/1.17ML ~~LOC~~ SOSY
210.0000 mg | PREFILLED_SYRINGE | Freq: Once | SUBCUTANEOUS | Status: AC
  Administered 2024-07-26: 210 mg via SUBCUTANEOUS

## 2024-08-02 ENCOUNTER — Other Ambulatory Visit: Payer: Self-pay

## 2024-08-06 ENCOUNTER — Other Ambulatory Visit: Payer: Self-pay

## 2024-08-06 MED ORDER — BUTALBITAL-APAP-CAFFEINE 50-325-40 MG PO TABS: ORAL_TABLET | ORAL | 0 refills | Status: AC

## 2024-08-06 NOTE — Telephone Encounter (Signed)
 Pt Last Seen 06/01/2024 Upcoming Appointment 06/24/25  butalbital -acetaminophen -caffeine  (FIORICET) 50-325-40 MG tablet Last filled 07/03/24

## 2024-08-21 ENCOUNTER — Encounter: Payer: Self-pay | Admitting: Gastroenterology

## 2024-08-21 ENCOUNTER — Other Ambulatory Visit

## 2024-08-21 ENCOUNTER — Ambulatory Visit: Admitting: Gastroenterology

## 2024-08-21 VITALS — BP 100/70 | HR 96 | Ht 59.0 in | Wt 91.4 lb

## 2024-08-21 DIAGNOSIS — Z860101 Personal history of adenomatous and serrated colon polyps: Secondary | ICD-10-CM | POA: Diagnosis not present

## 2024-08-21 DIAGNOSIS — Z980 Intestinal bypass and anastomosis status: Secondary | ICD-10-CM

## 2024-08-21 DIAGNOSIS — Z79899 Other long term (current) drug therapy: Secondary | ICD-10-CM

## 2024-08-21 DIAGNOSIS — K219 Gastro-esophageal reflux disease without esophagitis: Secondary | ICD-10-CM | POA: Diagnosis not present

## 2024-08-21 DIAGNOSIS — D509 Iron deficiency anemia, unspecified: Secondary | ICD-10-CM

## 2024-08-21 DIAGNOSIS — K222 Esophageal obstruction: Secondary | ICD-10-CM | POA: Diagnosis not present

## 2024-08-21 DIAGNOSIS — K3184 Gastroparesis: Secondary | ICD-10-CM

## 2024-08-21 DIAGNOSIS — K5909 Other constipation: Secondary | ICD-10-CM | POA: Diagnosis not present

## 2024-08-21 DIAGNOSIS — R1310 Dysphagia, unspecified: Secondary | ICD-10-CM | POA: Diagnosis not present

## 2024-08-21 DIAGNOSIS — K5900 Constipation, unspecified: Secondary | ICD-10-CM | POA: Diagnosis not present

## 2024-08-21 LAB — COMPREHENSIVE METABOLIC PANEL WITH GFR
ALT: 15 U/L (ref 3–35)
AST: 19 U/L (ref 5–37)
Albumin: 4 g/dL (ref 3.5–5.2)
Alkaline Phosphatase: 85 U/L (ref 39–117)
BUN: 15 mg/dL (ref 6–23)
CO2: 27 meq/L (ref 19–32)
Calcium: 8.9 mg/dL (ref 8.4–10.5)
Chloride: 104 meq/L (ref 96–112)
Creatinine, Ser: 0.92 mg/dL (ref 0.40–1.20)
GFR: 62.27 mL/min
Glucose, Bld: 103 mg/dL — ABNORMAL HIGH (ref 70–99)
Potassium: 4.1 meq/L (ref 3.5–5.1)
Sodium: 140 meq/L (ref 135–145)
Total Bilirubin: 0.2 mg/dL (ref 0.2–1.2)
Total Protein: 6.4 g/dL (ref 6.0–8.3)

## 2024-08-21 LAB — CBC WITH DIFFERENTIAL/PLATELET
Basophils Absolute: 0 10*3/uL (ref 0.0–0.1)
Basophils Relative: 0.3 % (ref 0.0–3.0)
Eosinophils Absolute: 0 10*3/uL (ref 0.0–0.7)
Eosinophils Relative: 0.3 % (ref 0.0–5.0)
HCT: 45.1 % (ref 36.0–46.0)
Hemoglobin: 14.8 g/dL (ref 12.0–15.0)
Lymphocytes Relative: 12 % (ref 12.0–46.0)
Lymphs Abs: 1 10*3/uL (ref 0.7–4.0)
MCHC: 32.9 g/dL (ref 30.0–36.0)
MCV: 97.7 fl (ref 78.0–100.0)
Monocytes Absolute: 0.6 10*3/uL (ref 0.1–1.0)
Monocytes Relative: 6.9 % (ref 3.0–12.0)
Neutro Abs: 6.6 10*3/uL (ref 1.4–7.7)
Neutrophils Relative %: 80.5 % — ABNORMAL HIGH (ref 43.0–77.0)
Platelets: 280 10*3/uL (ref 150.0–400.0)
RBC: 4.62 Mil/uL (ref 3.87–5.11)
RDW: 13.8 % (ref 11.5–15.5)
WBC: 8.2 10*3/uL (ref 4.0–10.5)

## 2024-08-21 LAB — IBC + FERRITIN
Ferritin: 42.5 ng/mL (ref 10.0–291.0)
Iron: 32 ug/dL — ABNORMAL LOW (ref 42–145)
Saturation Ratios: 10 % — ABNORMAL LOW (ref 20.0–50.0)
TIBC: 320.6 ug/dL (ref 250.0–450.0)
Transferrin: 229 mg/dL (ref 212.0–360.0)

## 2024-08-21 LAB — VITAMIN D 25 HYDROXY (VIT D DEFICIENCY, FRACTURES): VITD: 51.63 ng/mL (ref 30.00–100.00)

## 2024-08-21 LAB — B12 AND FOLATE PANEL
Folate: 13 ng/mL
Vitamin B-12: >1500 pg/mL — ABNORMAL HIGH (ref 211–911)

## 2024-08-21 MED ORDER — METOCLOPRAMIDE HCL 5 MG PO TABS: 5.0000 mg | ORAL_TABLET | Freq: Three times a day (TID) | ORAL | 3 refills | Status: AC | PRN

## 2024-08-21 NOTE — Progress Notes (Signed)
 Agree with assessment and plan as outlined.  Pod A RN - can you let the patient know I would like her to be on a clear liquid diet for at least 48 hours prior to procedure time.  She has had multiple procedures with residual food in her stomach with regular n.p.o. timeframe and agree with starting her on Reglan  as well prior to the procedure.  Thanks

## 2024-08-21 NOTE — Patient Instructions (Addendum)
 Please go to the lab in the basement of our building to have lab work done as you leave today. Hit B for basement when you get on the elevator.  When the doors open the lab is on your left.  We will call you with the results. Thank you.   You have been scheduled for an Endoscopy with dilation with Dr. Leigh on 09-10-24. Please follow written instructions given to you at your visit today. Clear liquids only for TWO DAYS prior to your procedure.  If you use inhalers (even only as needed), please bring them with you on the day of your procedure.  If you take any of the following medications, they will need to be adjusted prior to your procedure:   DO NOT TAKE 7 DAYS PRIOR TO TEST- Trulicity (dulaglutide) Ozempic, Wegovy (semaglutide) Mounjaro, Zepbound (tirzepatide) Bydureon Bcise (exanatide extended release)  DO NOT TAKE 1 DAY PRIOR TO YOUR TEST Rybelsus (semaglutide) Adlyxin (lixisenatide) Victoza (liraglutide) Byetta (exanatide) ___________________________________________________________________________    We have sent the following medications to your pharmacy for you to pick up at your convenience: Reglan  5 mg: Take three times a day with meals, as needed  Thank you for trusting me with your gastrointestinal care!   Nestor Blower, PA   _______________________________________________________  If your blood pressure at your visit was 140/90 or greater, please contact your primary care physician to follow up on this.  _______________________________________________________  If you are age 73 or older, your body mass index should be between 23-30. Your Body mass index is 18.46 kg/m. If this is out of the aforementioned range listed, please consider follow up with your Primary Care Provider.  If you are age 52 or younger, your body mass index should be between 19-25. Your Body mass index is 18.46 kg/m. If this is out of the aformentioned range listed, please consider  follow up with your Primary Care Provider.   ________________________________________________________  The [facility] providers would like to encourage you to use MYCHART to communicate with providers for non-urgent requests or questions.  Due to long hold times on the telephone, sending your provider a message by [facility] may be a faster and more efficient way to get a response.  Please allow 48 business hours for a response.  Please remember that this is for non-urgent requests.  _______________________________________________________  [facility] is using a team-based approach to care.  Your team is made up of your doctor and two to three APPS. Our APPS (Nurse Practitioners and Physician Assistants) work with your physician to ensure care continuity for you. They are fully qualified to address your health concerns and develop a treatment plan. They communicate directly with your gastroenterologist to care for you. Seeing the Advanced Practice Practitioners on your physician's team can help you by facilitating care more promptly, often allowing for earlier appointments, access to diagnostic testing, procedures, and other specialty referrals.    Due to recent changes in healthcare laws, you may see the results of your imaging and laboratory studies on MyChart before your provider has had a chance to review them.  We understand that in some cases there may be results that are confusing or concerning to you. Not all laboratory results come back in the same time frame and the provider may be waiting for multiple results in order to interpret others.  Please give us  48 hours in order for your provider to thoroughly review all the results before contacting the office for clarification of your results.

## 2024-08-22 ENCOUNTER — Telehealth: Payer: Self-pay | Admitting: Gastroenterology

## 2024-08-22 NOTE — Telephone Encounter (Signed)
 See note from cc'd chart.

## 2024-08-22 NOTE — Progress Notes (Signed)
 Spoke with pt and she is aware of recommendations per Dr. Leigh. Pt states she may do clear liquids longer than 48 hours.

## 2024-08-22 NOTE — Progress Notes (Signed)
 Left message for pt to call back

## 2024-08-22 NOTE — Telephone Encounter (Signed)
 Patient is returning phone call, Please advise, Thank you.

## 2024-08-30 ENCOUNTER — Encounter (HOSPITAL_COMMUNITY)

## 2024-09-10 ENCOUNTER — Encounter: Admitting: Gastroenterology

## 2025-06-24 ENCOUNTER — Ambulatory Visit: Admitting: Family Medicine
